# Patient Record
Sex: Female | Born: 1955
Health system: Southern US, Community
[De-identification: ages and names within clinical notes are randomized; demographics above are authoritative.]

## PROBLEM LIST (undated history)

## (undated) DIAGNOSIS — G90522 Complex regional pain syndrome I of left lower limb: Secondary | ICD-10-CM

## (undated) DIAGNOSIS — E079 Disorder of thyroid, unspecified: Secondary | ICD-10-CM

## (undated) DIAGNOSIS — H04129 Dry eye syndrome of unspecified lacrimal gland: Secondary | ICD-10-CM

## (undated) DIAGNOSIS — E785 Hyperlipidemia, unspecified: Secondary | ICD-10-CM

## (undated) DIAGNOSIS — Z974 Presence of external hearing-aid: Secondary | ICD-10-CM

## (undated) DIAGNOSIS — M1990 Unspecified osteoarthritis, unspecified site: Secondary | ICD-10-CM

## (undated) DIAGNOSIS — Z972 Presence of dental prosthetic device (complete) (partial): Secondary | ICD-10-CM

## (undated) DIAGNOSIS — G709 Myoneural disorder, unspecified: Secondary | ICD-10-CM

## (undated) DIAGNOSIS — Z860100 Personal history of colon polyps, unspecified: Secondary | ICD-10-CM

## (undated) DIAGNOSIS — C801 Malignant (primary) neoplasm, unspecified: Secondary | ICD-10-CM

## (undated) DIAGNOSIS — E039 Hypothyroidism, unspecified: Secondary | ICD-10-CM

## (undated) DIAGNOSIS — F419 Anxiety disorder, unspecified: Secondary | ICD-10-CM

## (undated) DIAGNOSIS — M533 Sacrococcygeal disorders, not elsewhere classified: Secondary | ICD-10-CM

## (undated) DIAGNOSIS — Z973 Presence of spectacles and contact lenses: Secondary | ICD-10-CM

## (undated) DIAGNOSIS — Z86000 Personal history of in-situ neoplasm of breast: Secondary | ICD-10-CM

## (undated) HISTORY — DX: Myoneural disorder, unspecified: G70.9

## (undated) HISTORY — PX: JOINT REPLACEMENT: SHX530

## (undated) HISTORY — DX: Personal history of in-situ neoplasm of breast: Z86.000

## (undated) HISTORY — DX: Personal history of colonic polyps: Z86.010

## (undated) HISTORY — PX: BREAST BIOPSY: SHX20

## (undated) HISTORY — DX: Personal history of colon polyps, unspecified: Z86.0100

## (undated) HISTORY — DX: Unspecified osteoarthritis, unspecified site: M19.90

## (undated) HISTORY — PX: COLONOSCOPY: SHX174

## (undated) HISTORY — PX: CARPAL TUNNEL RELEASE: SHX101

## (undated) HISTORY — PX: TUBAL LIGATION: SHX77

## (undated) HISTORY — DX: Anxiety disorder, unspecified: F41.9

## (undated) HISTORY — DX: Disorder of thyroid, unspecified: E07.9

## (undated) HISTORY — DX: Hyperlipidemia, unspecified: E78.5

---

## 1984-03-23 HISTORY — PX: TUBAL LIGATION: SHX77

## 2003-03-24 HISTORY — PX: BREAST BIOPSY: SHX20

## 2003-05-22 HISTORY — PX: BREAST LUMPECTOMY: SHX2

## 2008-01-19 DIAGNOSIS — C50919 Malignant neoplasm of unspecified site of unspecified female breast: Secondary | ICD-10-CM | POA: Insufficient documentation

## 2009-03-26 DIAGNOSIS — K635 Polyp of colon: Secondary | ICD-10-CM | POA: Insufficient documentation

## 2010-01-21 DIAGNOSIS — F329 Major depressive disorder, single episode, unspecified: Secondary | ICD-10-CM | POA: Insufficient documentation

## 2010-12-09 DIAGNOSIS — M949 Disorder of cartilage, unspecified: Secondary | ICD-10-CM | POA: Insufficient documentation

## 2011-03-24 LAB — HM DEXA SCAN

## 2013-11-01 DIAGNOSIS — G5600 Carpal tunnel syndrome, unspecified upper limb: Secondary | ICD-10-CM | POA: Insufficient documentation

## 2013-11-23 DIAGNOSIS — M542 Cervicalgia: Secondary | ICD-10-CM | POA: Insufficient documentation

## 2013-11-23 DIAGNOSIS — G5603 Carpal tunnel syndrome, bilateral upper limbs: Secondary | ICD-10-CM | POA: Insufficient documentation

## 2013-11-23 DIAGNOSIS — F17200 Nicotine dependence, unspecified, uncomplicated: Secondary | ICD-10-CM | POA: Insufficient documentation

## 2013-11-23 DIAGNOSIS — R9089 Other abnormal findings on diagnostic imaging of central nervous system: Secondary | ICD-10-CM | POA: Insufficient documentation

## 2014-03-05 DIAGNOSIS — M8580 Other specified disorders of bone density and structure, unspecified site: Secondary | ICD-10-CM | POA: Insufficient documentation

## 2014-03-21 DIAGNOSIS — Z01419 Encounter for gynecological examination (general) (routine) without abnormal findings: Secondary | ICD-10-CM | POA: Insufficient documentation

## 2014-03-21 DIAGNOSIS — B977 Papillomavirus as the cause of diseases classified elsewhere: Secondary | ICD-10-CM | POA: Insufficient documentation

## 2016-03-23 LAB — HM MAMMOGRAPHY: HM Mammogram: NORMAL (ref 0–4)

## 2016-04-09 DIAGNOSIS — E559 Vitamin D deficiency, unspecified: Secondary | ICD-10-CM | POA: Insufficient documentation

## 2016-04-15 LAB — HM COLONOSCOPY

## 2016-06-21 LAB — HM PAP SMEAR: HM Pap smear: ABNORMAL

## 2016-10-19 ENCOUNTER — Telehealth: Payer: Self-pay | Admitting: Behavioral Health

## 2016-10-19 ENCOUNTER — Encounter: Payer: Self-pay | Admitting: Behavioral Health

## 2016-10-19 NOTE — Telephone Encounter (Signed)
Pre-Visit Call completed with patient and chart updated.   Pre-Visit Info documented in Specialty Comments under SnapShot.    

## 2016-10-21 ENCOUNTER — Ambulatory Visit (INDEPENDENT_AMBULATORY_CARE_PROVIDER_SITE_OTHER): Payer: 59 | Admitting: Family Medicine

## 2016-10-21 ENCOUNTER — Encounter: Payer: Self-pay | Admitting: Family Medicine

## 2016-10-21 VITALS — BP 112/68 | HR 83 | Temp 98.3°F | Ht 66.0 in | Wt 186.4 lb

## 2016-10-21 DIAGNOSIS — E039 Hypothyroidism, unspecified: Secondary | ICD-10-CM

## 2016-10-21 DIAGNOSIS — F419 Anxiety disorder, unspecified: Secondary | ICD-10-CM

## 2016-10-21 DIAGNOSIS — M7711 Lateral epicondylitis, right elbow: Secondary | ICD-10-CM | POA: Diagnosis not present

## 2016-10-21 DIAGNOSIS — Z23 Encounter for immunization: Secondary | ICD-10-CM

## 2016-10-21 DIAGNOSIS — E785 Hyperlipidemia, unspecified: Secondary | ICD-10-CM

## 2016-10-21 MED ORDER — MELOXICAM 15 MG PO TABS: 15.0000 mg | ORAL_TABLET | Freq: Every day | ORAL | 0 refills | Status: DC

## 2016-10-21 MED ORDER — LEVOTHYROXINE SODIUM 125 MCG PO TABS: 125.0000 ug | ORAL_TABLET | Freq: Every day | ORAL | 1 refills | Status: DC

## 2016-10-21 MED ORDER — VENLAFAXINE HCL ER 150 MG PO CP24: 150.0000 mg | ORAL_CAPSULE | Freq: Every day | ORAL | 1 refills | Status: DC

## 2016-10-21 MED ORDER — CYCLOBENZAPRINE HCL 10 MG PO TABS: 10.0000 mg | ORAL_TABLET | ORAL | 0 refills | Status: DC | PRN

## 2016-10-21 MED ORDER — ALPRAZOLAM 0.5 MG PO TABS: 0.5000 mg | ORAL_TABLET | Freq: Every day | ORAL | 1 refills | Status: DC | PRN

## 2016-10-21 MED ORDER — ATORVASTATIN CALCIUM 10 MG PO TABS: 10.0000 mg | ORAL_TABLET | Freq: Every day | ORAL | 1 refills | Status: DC

## 2016-10-21 NOTE — Patient Instructions (Addendum)
Ice/cold pack over area for 10-15 min every 2-3 hours while awake.  Use a forearm strap (Band-IT or another option available) to help with pain.  Let us know if you need anything.

## 2016-10-21 NOTE — Progress Notes (Signed)
Musculoskeletal Exam  Patient: Elaine Buck DOB: 1955/09/01  DOS: 10/21/2016  SUBJECTIVE:  Chief Complaint:   Chief Complaint  Patient presents with  . Establish Care    Patient is C/O right elbow pain with movement times 6 months.  Describes as dull ache.  Denies any injury.    Elaine Buck is a 61 y.o.  female for evaluation and treatment of R elbow pain. She is a new pt.   Onset:  6 months ago. Sudden, no change in activity.  Location: Outer R elbow Character:  dull  Progression of issue:  is unchanged Associated symptoms: None Treatment: to date has been: Tylenol arthritis.   Neurovascular symptoms: no  ROS: Musculoskeletal/Extremities: +R elbow pain Neurologic: no numbness, tingling no weakness   Past Medical History:  Diagnosis Date  . Anxiety   . Hyperlipidemia   . Thyroid disease    Past Surgical History:  Procedure Laterality Date  . BREAST BIOPSY Left 05/22/2003   LCIS Cells found in breast tissue  . NO PAST SURGERIES     Family History  Problem Relation Age of Onset  . Stroke Mother   . Bipolar disorder Son   . ADD / ADHD Son   . Anxiety disorder Son    Current Outpatient Prescriptions  Medication Sig Dispense Refill  . ALPRAZolam (XANAX) 0.5 MG tablet Take 0.5 mg by mouth as needed for anxiety.    Marland Kitchen atorvastatin (LIPITOR) 10 MG tablet Take 10 mg by mouth daily.    . cyclobenzaprine (FLEXERIL) 10 MG tablet Take 1 tablet by mouth as needed.    Marland Kitchen levothyroxine (SYNTHROID, LEVOTHROID) 125 MCG tablet Take 125 mcg by mouth daily before breakfast.    . venlafaxine XR (EFFEXOR-XR) 150 MG 24 hr capsule Take 150 mg by mouth daily with breakfast.     No Known Allergies Social History   Social History  . Marital status: Married   Social History Main Topics  . Smoking status: Smoker, Current Status Unknown    Packs/day: 0.50    Years: 20.00    Types: Cigarettes  . Smokeless tobacco: Never Used  . Alcohol use Yes     Comment: [place].  . Drug use:  No   Objective: VITAL SIGNS: BP 112/68 (BP Location: Right Arm, Patient Position: Sitting, Cuff Size: Normal)   Pulse 83   Temp 98.3 F (36.8 C) (Oral)   Ht 5\' 6"  (1.676 m)   Wt 186 lb 6.4 oz (84.6 kg)   SpO2 97%   BMI 30.09 kg/m  Constitutional: Well formed, well developed. No acute distress. Cardiovascular: RRR, no murmurs, no bruits, brisk cap refill Thorax & Lungs: CTAB; No accessory muscle use Extremities: No clubbing. No cyanosis. No edema.  Skin: Warm. Dry. No erythema. No rash.  Musculoskeletal: R elbow.   Normal active range of motion: yes.   Normal passive range of motion: yes Tenderness to palpation: Yes, over Lat epicondyle and prox wrist extensors Deformity: no Ecchymosis: no +Pain with resisted extension of R wrist Neurologic: Normal sensory function.  Psychiatric: Normal mood. Age appropriate judgment and insight. Alert & oriented x 3.    Assessment:  Lateral epicondylitis of right elbow - Plan: meloxicam (MOBIC) 15 MG tablet  Hypothyroidism, unspecified type - Plan: levothyroxine (SYNTHROID, LEVOTHROID) 125 MCG tablet  Anxiety - Plan: ALPRAZolam (XANAX) 0.5 MG tablet, venlafaxine XR (EFFEXOR-XR) 150 MG 24 hr capsule, cyclobenzaprine (FLEXERIL) 10 MG tablet  Hyperlipidemia, unspecified hyperlipidemia type - Plan: atorvastatin (LIPITOR) 10 MG tablet  Plan:  Tennis elbow, recommended elbow/forearm strap, NSAIDs, ice prn. If no improvement, RTC and will discuss OMT vs  PT. Orders as above. She uses Flexeril 2-3 times per mo, Xanax 6-8 times per month. Doing well on other meds. CSC today. UDS at next visit when she is getting labs. Will discuss BMD testing at f/u visit. F/u in around 10 weeks in middle of October (this will put her 6 mo out from previous labs). The patient voiced understanding and agreement to the plan.   [place], DO 10/21/16  11:08 AM

## 2016-10-25 ENCOUNTER — Other Ambulatory Visit: Payer: Self-pay | Admitting: Family Medicine

## 2016-10-25 DIAGNOSIS — M7711 Lateral epicondylitis, right elbow: Secondary | ICD-10-CM

## 2016-10-25 DIAGNOSIS — F419 Anxiety disorder, unspecified: Secondary | ICD-10-CM

## 2016-10-26 NOTE — Telephone Encounter (Signed)
Rx's denied.  Pt and pharmary requesting refill to soon.//AB/CMA

## 2016-12-28 ENCOUNTER — Encounter: Payer: Self-pay | Admitting: Family Medicine

## 2016-12-28 ENCOUNTER — Other Ambulatory Visit: Payer: Self-pay | Admitting: Family Medicine

## 2016-12-28 ENCOUNTER — Ambulatory Visit (INDEPENDENT_AMBULATORY_CARE_PROVIDER_SITE_OTHER): Payer: 59 | Admitting: Family Medicine

## 2016-12-28 ENCOUNTER — Telehealth: Payer: Self-pay | Admitting: Family Medicine

## 2016-12-28 VITALS — BP 104/64 | HR 80 | Temp 98.5°F | Ht 66.0 in | Wt 187.0 lb

## 2016-12-28 DIAGNOSIS — M8580 Other specified disorders of bone density and structure, unspecified site: Secondary | ICD-10-CM | POA: Diagnosis not present

## 2016-12-28 DIAGNOSIS — F419 Anxiety disorder, unspecified: Secondary | ICD-10-CM | POA: Diagnosis not present

## 2016-12-28 DIAGNOSIS — E039 Hypothyroidism, unspecified: Secondary | ICD-10-CM | POA: Diagnosis not present

## 2016-12-28 DIAGNOSIS — E785 Hyperlipidemia, unspecified: Secondary | ICD-10-CM | POA: Diagnosis not present

## 2016-12-28 DIAGNOSIS — Z23 Encounter for immunization: Secondary | ICD-10-CM

## 2016-12-28 LAB — COMPREHENSIVE METABOLIC PANEL
ALT: 23 U/L (ref 0–35)
AST: 18 U/L (ref 0–37)
Albumin: 4.4 g/dL (ref 3.5–5.2)
Alkaline Phosphatase: 62 U/L (ref 39–117)
BUN: 13 mg/dL (ref 6–23)
CO2: 26 mEq/L (ref 19–32)
Calcium: 9.1 mg/dL (ref 8.4–10.5)
Chloride: 106 mEq/L (ref 96–112)
Creatinine, Ser: 0.85 mg/dL (ref 0.40–1.20)
GFR: 71.98 mL/min (ref >60.00)
Glucose, Bld: 103 mg/dL — ABNORMAL HIGH (ref 70–99)
Potassium: 3.9 mEq/L (ref 3.5–5.1)
Sodium: 141 mEq/L (ref 135–145)
Total Bilirubin: 0.4 mg/dL (ref 0.2–1.2)
Total Protein: 7 g/dL (ref 6.0–8.3)

## 2016-12-28 LAB — LIPID PANEL
Cholesterol: 167 mg/dL (ref 0–200)
HDL: 45.4 mg/dL
LDL Cholesterol: 98 mg/dL (ref 0–99)
NonHDL: 121.39
Total CHOL/HDL Ratio: 4
Triglycerides: 119 mg/dL (ref 0.0–149.0)
VLDL: 23.8 mg/dL (ref 0.0–40.0)

## 2016-12-28 LAB — T4, FREE: Free T4: 1.11 ng/dL (ref 0.60–1.60)

## 2016-12-28 LAB — TSH: TSH: 0.15 u[IU]/mL — ABNORMAL LOW (ref 0.35–4.50)

## 2016-12-28 MED ORDER — LEVOTHYROXINE SODIUM 125 MCG PO TABS: 125.0000 ug | ORAL_TABLET | Freq: Every day | ORAL | 0 refills | Status: DC

## 2016-12-28 MED ORDER — LEVOTHYROXINE SODIUM 125 MCG PO TABS: 125.0000 ug | ORAL_TABLET | Freq: Every day | ORAL | 1 refills | Status: DC

## 2016-12-28 MED ORDER — ALPRAZOLAM 0.5 MG PO TABS: 0.5000 mg | ORAL_TABLET | Freq: Every day | ORAL | 5 refills | Status: DC | PRN

## 2016-12-28 NOTE — Progress Notes (Signed)
Chief Complaint  Patient presents with  . Follow-up    tennis elbow    Subjective: Dyslipidemia Patient presents for dyslipidemia follow up. Compliance with treatment thus far has been good. She denies myalgias. She does not use medications that may worsen dyslipidemias (corticosteroids, progestins, anabolic steroids, diuretics, beta-blockers, amiodarone, cyclosporine, olanzapine). She is adhering to a low sodium and low fat diet. The patient exercises 2-3 times per week; walking and physically active around at home.  The patient is known to have coexisting coronary artery disease.   Hypothyroidism Patient presents for follow-up of hypothyroidism.  Reports compliance with medication- Synthroid 125 mcg daily. Current symptoms include: none Denies: fatigue, weight gain, feeling cold and cold intolerance, constipation, losing hair, palpitations and weight loss She believes her dose should be unchanged  Anxiety/depression Currently tx'd with Effexor. Tolerating well, compliant. Xanax around 6-8x/mo. Stressors include son living w them who has Bipolar. No SI or HI. No self medication.   ROS: Heart: Denies chest pain Lungs: Denies SOB   Family History  Problem Relation Age of Onset  . Stroke Mother   . Bipolar disorder Son   . ADD / ADHD Son   . Anxiety disorder Son    Past Medical History:  Diagnosis Date  . Anxiety   . Hyperlipidemia   . Thyroid disease    No Known Allergies  Current Outpatient Prescriptions:  .  ALPRAZolam (XANAX) 0.5 MG tablet, Take 1 tablet (0.5 mg total) by mouth daily as needed for anxiety., Disp: 30 tablet, Rfl: 5 .  atorvastatin (LIPITOR) 10 MG tablet, Take 1 tablet (10 mg total) by mouth daily., Disp: 90 tablet, Rfl: 1 .  cyclobenzaprine (FLEXERIL) 10 MG tablet, Take 1 tablet (10 mg total) by mouth as needed., Disp: 30 tablet, Rfl: 0 .  venlafaxine XR (EFFEXOR-XR) 150 MG 24 hr capsule, Take 1 capsule (150 mg total) by mouth daily with  breakfast., Disp: 90 capsule, Rfl: 1 .  levothyroxine (SYNTHROID, LEVOTHROID) 125 MCG tablet, Take 1 tablet (125 mcg total) by mouth daily., Disp: 15 tablet, Rfl: 0  Objective: BP 104/64 (BP Location: Left Arm, Patient Position: Sitting, Cuff Size: Normal)   Pulse 80   Temp 98.5 F (36.9 C) (Oral)   Ht 5\' 6"  (1.676 m)   Wt 187 lb (84.8 kg)   SpO2 96%   BMI 30.18 kg/m  General: Awake, appears stated age HEENT: MMM, EOMi Neck: Supple, symmetric, no thyromegaly Heart: RRR, no murmurs, no bruits, no LE edema Lungs: CTAB, no rales, wheezes or rhonchi. No accessory muscle use Psych: Age appropriate judgment and insight, normal affect and mood  Assessment and Plan: Anxiety - Plan: ALPRAZolam (XANAX) 0.5 MG tablet  Hyperlipidemia, unspecified hyperlipidemia type - Plan: Lipid panel, Comprehensive metabolic panel  Hypothyroidism, unspecified type - Plan: TSH, T4, free, DISCONTINUED: levothyroxine (SYNTHROID, LEVOTHROID) 125 MCG tablet  Osteopenia, unspecified location - Plan: DG Bone Density  Orders as above. Cont current dosages. Recheck some labs.  Immunization updates.  F/u in 6 mo or prn. The patient voiced understanding and agreement to the plan.  [place], DO 12/28/16  8:33 AM

## 2016-12-28 NOTE — Patient Instructions (Addendum)
Give [place] 2-3 business days to get the results of your labs back. If labs are normal, you will likely receive a letter in the mail unless you have MyChart. This can take longer than 2-3 business days.   Let us know if you need anything.

## 2016-12-28 NOTE — Telephone Encounter (Signed)
Pt returned call for results. Pt ph [phone].

## 2016-12-28 NOTE — Progress Notes (Signed)
Pre visit review using our clinic review tool, if applicable. No additional management support is needed unless otherwise documented below in the visit note. 

## 2016-12-28 NOTE — Telephone Encounter (Signed)
See results

## 2016-12-28 NOTE — Addendum Note (Signed)
Addended by: [place] Seller B on: 12/28/2016 08:53 AM   Modules accepted: Orders

## 2016-12-30 ENCOUNTER — Ambulatory Visit: Payer: 59 | Admitting: Family Medicine

## 2016-12-30 ENCOUNTER — Ambulatory Visit: Payer: Self-pay | Admitting: Family Medicine

## 2017-01-11 ENCOUNTER — Ambulatory Visit (HOSPITAL_BASED_OUTPATIENT_CLINIC_OR_DEPARTMENT_OTHER)
Admission: RE | Admit: 2017-01-11 | Discharge: 2017-01-11 | Disposition: A | Discharge status: Home / Self Care | Payer: 59 | Source: Ambulatory Visit | Attending: Family Medicine | Admitting: Family Medicine

## 2017-01-11 DIAGNOSIS — M8580 Other specified disorders of bone density and structure, unspecified site: Secondary | ICD-10-CM | POA: Diagnosis present

## 2017-01-11 DIAGNOSIS — M85852 Other specified disorders of bone density and structure, left thigh: Secondary | ICD-10-CM | POA: Diagnosis not present

## 2017-05-10 ENCOUNTER — Ambulatory Visit (INDEPENDENT_AMBULATORY_CARE_PROVIDER_SITE_OTHER): Payer: 59 | Admitting: Family Medicine

## 2017-05-10 ENCOUNTER — Encounter: Payer: Self-pay | Admitting: Family Medicine

## 2017-05-10 VITALS — BP 110/62 | HR 97 | Temp 98.1°F | Ht 66.0 in | Wt 191.1 lb

## 2017-05-10 DIAGNOSIS — J0100 Acute maxillary sinusitis, unspecified: Secondary | ICD-10-CM

## 2017-05-10 MED ORDER — AMOXICILLIN-POT CLAVULANATE 875-125 MG PO TABS
1.0000 | ORAL_TABLET | Freq: Two times a day (BID) | ORAL | 0 refills | Status: DC
Start: 2017-05-10 — End: 2017-07-02

## 2017-05-10 NOTE — Progress Notes (Signed)
Pre visit review using our clinic review tool, if applicable. No additional management support is needed unless otherwise documented below in the visit note. 

## 2017-05-10 NOTE — Patient Instructions (Addendum)
Most sinus infections are viral in etiology and antibiotics will not be helpful. That being said, if you start having worsening symptoms over 3 days, you are worsening by day 10 or not improving by day 14, go ahead and take it. You are on Day 5 as of now.   Ibuprofen 400-600 mg (2-3 over the counter strength tabs) every 6 hours as needed for pain.  Continue to push fluids, practice good hand hygiene, and cover your mouth if you cough.  If you start having fevers, shaking or shortness of breath, seek immediate care.  Let us know if you need anything.

## 2017-05-10 NOTE — Progress Notes (Signed)
CC: Sinus infxn  Elaine Buck here for URI complaints.  Duration: 5 days  Associated symptoms: sinus congestion, sinus pain, ear fullness and cough Denies: rhinorrhea, itchy watery eyes, ear pain, ear drainage, sore throat, wheezing, shortness of breath, myalgia and fevers Treatment to date: Nyquil, Dayquil, Sudafed, Mucinex Sick contacts: No  ROS:  Const: Denies fevers HEENT: As noted in HPI Lungs: No SOB  Past Medical History:  Diagnosis Date  . Anxiety   . Hyperlipidemia   . Thyroid disease    Family History  Problem Relation Age of Onset  . Stroke Mother   . Bipolar disorder Son   . ADD / ADHD Son   . Anxiety disorder Son     BP 110/62 (BP Location: Left Arm, Patient Position: Sitting, Cuff Size: Normal)   Pulse 97   Temp 98.1 F (36.7 C) (Oral)   Ht 5\' 6"  (1.676 m)   Wt 191 lb 2 oz (86.7 kg)   SpO2 99%   BMI 30.85 kg/m  General: Awake, alert, appears stated age HEENT: AT, [place], ears patent b/l and TM's neg, +max sinus ttp b/l, nares patent w/o discharge, pharynx pink and without exudates, MMM Neck: No masses or asymmetry Heart: RRR Lungs: CTAB, no accessory muscle use Psych: Age appropriate judgment and insight, normal mood and affect  Acute maxillary sinusitis, recurrence not specified - Plan: amoxicillin-clavulanate (AUGMENTIN) 875-125 MG tablet  Orders as above. Pocket rx instructions given. Ibuprofen. Continue to push fluids, practice good hand hygiene, cover mouth when coughing. F/u prn. If starting to experience fevers, shaking, or shortness of breath, seek immediate care. Pt voiced understanding and agreement to the plan.  [place], DO 05/10/17 3:40 PM

## 2017-06-03 ENCOUNTER — Encounter: Payer: Self-pay | Admitting: Family Medicine

## 2017-06-03 ENCOUNTER — Ambulatory Visit (INDEPENDENT_AMBULATORY_CARE_PROVIDER_SITE_OTHER): Payer: 59 | Admitting: Family Medicine

## 2017-06-03 VITALS — BP 110/70 | HR 84 | Temp 98.2°F | Ht 66.0 in | Wt 192.0 lb

## 2017-06-03 DIAGNOSIS — L989 Disorder of the skin and subcutaneous tissue, unspecified: Secondary | ICD-10-CM

## 2017-06-03 NOTE — Progress Notes (Signed)
Chief Complaint  Patient presents with  . black spot on left breast    Elaine Buck is a 62 y.o. female here for a skin complaint.  Duration: 2 days Location: L breast Pruritic? No Painful? No Drainage? No Other associated symptoms: redness around area, concerned it's a tick Therapies tried thus far: none  ROS:  Const: No fevers Skin: As noted in HPI  Past Medical History:  Diagnosis Date  . Anxiety   . Hyperlipidemia   . Thyroid disease    BP 110/70 (BP Location: Left Arm, Patient Position: Sitting, Cuff Size: Normal)   Pulse 84   Temp 98.2 F (36.8 C) (Oral)   Ht 5\' 6"  (1.676 m)   Wt 192 lb (87.1 kg)   SpO2 95%   BMI 30.99 kg/m  Gen: awake, alert, appearing stated age Lungs: No accessory muscle use Skin: see below. Examined in presence of female chaperone. No drainage, TTP, fluctuance Psych: Age appropriate judgment and insight  Media Information   L breast just above areola  Procedure note; shave biopsy Informed consent was obtained. The area was cleaned with alcohol and injected with 0.75 mL of 1% lidocaine with epinephrine. The area was grasped with a forceps and an 11 blade was used to dissect around area.  The specimen was placed in a sterile specimen cup and sent to the lab. The area was then cauterized ensuring adequate hemostasis. The area was dressed with triple antibiotic ointment and a bandage. There were no complications noted. The patient tolerated the procedure well.   Skin lesion - Plan: Dermatology pathology, PR SHAV SKIN LES < 0.5 CM TRUNK,ARM,LEG  Orders as above. Pt very concerned about possible malignancy or FB. Looks like a scab, but took off and sent to lab. Hopefully I am right. After care instructions verbalized and written in AVS. F/u prn. The patient voiced understanding and agreement to the plan.  [place], DO 06/03/17 4:34 PM

## 2017-06-03 NOTE — Patient Instructions (Signed)
Do not shower for the rest of the day. When you do wash it, use only soap and water. Do not vigorously scrub. Apply triple antibiotic ointment (like Neosporin) twice daily. Keep the area clean and dry.   Things to look out for: increasing pain not relieved by ibuprofen/acetaminophen, fevers, spreading redness, drainage of pus, or foul odor.  Around 1 week to get the skin results back.   Let us know if you need anything.

## 2017-06-03 NOTE — Progress Notes (Signed)
Pre visit review using our clinic review tool, if applicable. No additional management support is needed unless otherwise documented below in the visit note. 

## 2017-06-28 ENCOUNTER — Ambulatory Visit: Payer: 59 | Admitting: Family Medicine

## 2017-06-30 ENCOUNTER — Ambulatory Visit: Payer: 59 | Admitting: Family Medicine

## 2017-07-02 ENCOUNTER — Ambulatory Visit (INDEPENDENT_AMBULATORY_CARE_PROVIDER_SITE_OTHER): Payer: 59 | Admitting: Family Medicine

## 2017-07-02 ENCOUNTER — Encounter: Payer: Self-pay | Admitting: Family Medicine

## 2017-07-02 VITALS — BP 110/68 | HR 74 | Temp 98.6°F | Ht 66.0 in | Wt 189.5 lb

## 2017-07-02 DIAGNOSIS — F419 Anxiety disorder, unspecified: Secondary | ICD-10-CM | POA: Diagnosis not present

## 2017-07-02 DIAGNOSIS — E785 Hyperlipidemia, unspecified: Secondary | ICD-10-CM | POA: Diagnosis not present

## 2017-07-02 DIAGNOSIS — E039 Hypothyroidism, unspecified: Secondary | ICD-10-CM

## 2017-07-02 LAB — COMPREHENSIVE METABOLIC PANEL
ALT: 24 U/L (ref 0–35)
AST: 22 U/L (ref 0–37)
Albumin: 4.4 g/dL (ref 3.5–5.2)
Alkaline Phosphatase: 63 U/L (ref 39–117)
BUN: 17 mg/dL (ref 6–23)
CO2: 29 mEq/L (ref 19–32)
Calcium: 9.3 mg/dL (ref 8.4–10.5)
Chloride: 104 mEq/L (ref 96–112)
Creatinine, Ser: 0.89 mg/dL (ref 0.40–1.20)
GFR: 68.15 mL/min (ref >60.00)
Glucose, Bld: 95 mg/dL (ref 70–99)
Potassium: 4.2 mEq/L (ref 3.5–5.1)
Sodium: 141 mEq/L (ref 135–145)
Total Bilirubin: 0.6 mg/dL (ref 0.2–1.2)
Total Protein: 7.2 g/dL (ref 6.0–8.3)

## 2017-07-02 LAB — LIPID PANEL
Cholesterol: 165 mg/dL (ref 0–200)
HDL: 43.7 mg/dL (ref >39.00)
LDL Cholesterol: 99 mg/dL (ref 0–99)
NonHDL: 121.54
Total CHOL/HDL Ratio: 4
Triglycerides: 115 mg/dL (ref 0.0–149.0)
VLDL: 23 mg/dL (ref 0.0–40.0)

## 2017-07-02 LAB — TSH: TSH: 0.17 u[IU]/mL — ABNORMAL LOW (ref 0.35–4.50)

## 2017-07-02 MED ORDER — VENLAFAXINE HCL ER 150 MG PO CP24: 150.0000 mg | ORAL_CAPSULE | Freq: Every day | ORAL | 1 refills | Status: DC

## 2017-07-02 MED ORDER — ALPRAZOLAM 0.5 MG PO TABS: 0.5000 mg | ORAL_TABLET | Freq: Every day | ORAL | 5 refills | Status: DC | PRN

## 2017-07-02 NOTE — Patient Instructions (Addendum)
Please consider counseling. Contact [phone] to schedule an appointment or inquire about cost/insurance coverage.  1-2 days to get results of labs back and then refills associated with that.   Let us know if you need anything.

## 2017-07-02 NOTE — Progress Notes (Signed)
Chief Complaint  Patient presents with  . Follow-up    Subjective Elaine Buck presents for f/u anxiety/depression.  She is currently being treated with Effexor 150 mg XR, uses Xanax around 12 d/month.  Reports doing well since treatment. No thoughts of harming self or others. No self-medication with alcohol, prescription drugs or illicit drugs. Pt is not following with a counselor/psychologist.   Hyperlipidemia Patient presents for dyslipidemia follow up. Currently being treated with Lipitor 10 mg/d and compliance with treatment thus far has been good. She denies myalgias. She is sometimes adhering to a healthy. The patient exercises walks, lifting weights.  The patient is not known to have coexisting coronary artery disease.  Hypothyroidism Patient presents for follow-up of hypothyroidism.  Reports compliance with medication-thyroxine 125 mcg daily. Denies: denies fatigue, weight changes, heat/cold intolerance, bowel/skin changes or CVS symptoms She believes her dose should be unchanged pending labs  ROS Psych: No homicidal or suicidal thoughts MSK: +Jt pain, no muscle aches  Past Medical History:  Diagnosis Date  . Anxiety   . Hyperlipidemia   . Thyroid disease    Family History  Problem Relation Age of Onset  . Stroke Mother   . Bipolar disorder Son   . ADD / ADHD Son   . Anxiety disorder Son     Allergies as of 07/02/2017   No Known Allergies     Medication List        Accurate as of 07/02/17 10:13 AM. Always use your most recent med list.          ALPRAZolam 0.5 MG tablet Commonly known as:  XANAX Take 1 tablet (0.5 mg total) by mouth daily as needed for anxiety.   atorvastatin 10 MG tablet Commonly known as:  LIPITOR Take 1 tablet (10 mg total) by mouth daily.   cyclobenzaprine 10 MG tablet Commonly known as:  FLEXERIL Take 1 tablet (10 mg total) by mouth as needed.   levothyroxine 125 MCG tablet Commonly known as:  SYNTHROID,  LEVOTHROID Take 1 tablet (125 mcg total) by mouth daily.   venlafaxine XR 150 MG 24 hr capsule Commonly known as:  EFFEXOR-XR Take 1 capsule (150 mg total) by mouth daily with breakfast.       Exam BP 110/68 (BP Location: Left Arm, Patient Position: Sitting, Cuff Size: Normal)   Pulse 74   Temp 98.6 F (37 C) (Oral)   Ht 5\' 6"  (1.676 m)   Wt 189 lb 8 oz (86 kg)   SpO2 96%   BMI 30.59 kg/m  General:  well developed, well nourished, in no apparent distress Neck: neck supple without adenopathy, thyromegaly, or masses Lungs:  clear to auscultation, breath sounds equal bilaterally, no respiratory distress Cardio:  regular rate and rhythm without murmurs, heart sounds without clicks or rubs Psych: well oriented with normal range of affect and age-appropriate judgement/insight, alert and oriented x4.  Assessment and Plan  Anxiety - Plan: venlafaxine XR (EFFEXOR-XR) 150 MG 24 hr capsule, ALPRAZolam (XANAX) 0.5 MG tablet  Hypothyroidism, unspecified type - Plan: TSH  Hyperlipidemia, unspecified hyperlipidemia type - Plan: Lipid panel, Comprehensive metabolic panel  Orders as above. Doing well on above. [facility] info given. Will refill thyroid and chol med pending results.  F/u in in 6 mo for CPE. The patient voiced understanding and agreement to the plan.  [place], DO 07/02/17 10:13 AM

## 2017-07-02 NOTE — Progress Notes (Signed)
Pre visit review using our clinic review tool, if applicable. No additional management support is needed unless otherwise documented below in the visit note. 

## 2017-07-03 ENCOUNTER — Encounter: Payer: Self-pay | Admitting: Family Medicine

## 2017-07-05 ENCOUNTER — Other Ambulatory Visit: Payer: Self-pay | Admitting: Family Medicine

## 2017-07-05 DIAGNOSIS — E785 Hyperlipidemia, unspecified: Secondary | ICD-10-CM

## 2017-07-05 DIAGNOSIS — E038 Other specified hypothyroidism: Secondary | ICD-10-CM

## 2017-07-05 MED ORDER — ATORVASTATIN CALCIUM 10 MG PO TABS: 10.0000 mg | ORAL_TABLET | Freq: Every day | ORAL | 1 refills | Status: DC

## 2017-07-06 ENCOUNTER — Other Ambulatory Visit (INDEPENDENT_AMBULATORY_CARE_PROVIDER_SITE_OTHER): Payer: 59

## 2017-07-06 ENCOUNTER — Encounter: Payer: Self-pay | Admitting: Family Medicine

## 2017-07-06 DIAGNOSIS — E038 Other specified hypothyroidism: Secondary | ICD-10-CM

## 2017-07-06 LAB — T4, FREE: Free T4: 0.92 ng/dL (ref 0.60–1.60)

## 2017-07-07 MED ORDER — LEVOTHYROXINE SODIUM 125 MCG PO TABS: 125.0000 ug | ORAL_TABLET | Freq: Every day | ORAL | 1 refills | Status: DC

## 2017-07-20 ENCOUNTER — Ambulatory Visit (INDEPENDENT_AMBULATORY_CARE_PROVIDER_SITE_OTHER): Payer: 59 | Admitting: Obstetrics & Gynecology

## 2017-07-20 ENCOUNTER — Encounter: Payer: Self-pay | Admitting: Obstetrics & Gynecology

## 2017-07-20 VITALS — BP 106/72 | HR 72 | Wt 189.2 lb

## 2017-07-20 DIAGNOSIS — Z1151 Encounter for screening for human papillomavirus (HPV): Secondary | ICD-10-CM

## 2017-07-20 DIAGNOSIS — Z86000 Personal history of in-situ neoplasm of breast: Secondary | ICD-10-CM

## 2017-07-20 DIAGNOSIS — Z124 Encounter for screening for malignant neoplasm of cervix: Secondary | ICD-10-CM | POA: Diagnosis not present

## 2017-07-20 DIAGNOSIS — Z01419 Encounter for gynecological examination (general) (routine) without abnormal findings: Secondary | ICD-10-CM

## 2017-07-20 NOTE — Progress Notes (Signed)
GYNECOLOGY ANNUAL PREVENTATIVE CARE ENCOUNTER NOTE  Subjective:   Elaine Buck is a 62 y.o. [id number] PMP  female here for a routine annual gynecologic exam.  Current complaints: none.    Denies abnormal vaginal bleeding, discharge, pelvic pain, problems with intercourse or other gynecologic concerns.    Gynecologic History No LMP recorded. Patient is postmenopausal. Contraception: post menopausal status Last Pap: 2018. Results were: abnormal with positive HPV. Reports that she had positive HPV paps for about 5 years always with benign colposcopies, no procedures/ other interventions done just yearly surveillance.  Last mammogram: 2018. Results were: normal. History of left breast mass discovered in 2005, removed and they found LCIS cells in tissue. Treated with Tamoxifen until 2007; then switched to Evista for three more years. All subsequent mammograms have been negative.  Obstetric History OB History  Gravida Para Term Preterm AB Living  3 2 2   1 2   SAB TAB Ectopic Multiple Live Births  1       2    # Outcome Date GA Lbr Len/2nd Weight Sex Delivery Anes PTL Lv  3 SAB           2 Term           1 Term             Past Medical History:  Diagnosis Date  . Anxiety   . History of lobular carcinoma in situ (LCIS) of  left breast    s/p lumpectomy in 2005, Tamoxifen/Evista therapy for 5 years  . Hyperlipidemia   . Thyroid disease     Past Surgical History:  Procedure Laterality Date  . BREAST LUMPECTOMY Left 05/22/2003   LCIS Cells found in breast tissue    Current Outpatient Medications on File Prior to Visit  Medication Sig Dispense Refill  . ALPRAZolam (XANAX) 0.5 MG tablet Take 1 tablet (0.5 mg total) by mouth daily as needed for anxiety. 30 tablet 5  . atorvastatin (LIPITOR) 10 MG tablet Take 1 tablet (10 mg total) by mouth daily. 90 tablet 1  . cyclobenzaprine (FLEXERIL) 10 MG tablet Take 1 tablet (10 mg total) by mouth as needed. 30 tablet 0  . levothyroxine  (SYNTHROID, LEVOTHROID) 125 MCG tablet Take 1 tablet (125 mcg total) by mouth daily. 90 tablet 1  . venlafaxine XR (EFFEXOR-XR) 150 MG 24 hr capsule Take 1 capsule (150 mg total) by mouth daily with breakfast. 90 capsule 1   No current facility-administered medications on file prior to visit.     No Known Allergies  Social History   Socioeconomic History  . Marital status: Married    Spouse name: Not on file  . Number of children: 2  . Years of education: Not on file  . Highest education level: Some college, no degree  Occupational History  . Occupation: retired  Scientific laboratory technician  . Financial resource strain: Not on file  . Food insecurity:    Worry: Not on file    Inability: Not on file  . Transportation needs:    Medical: Not on file    Non-medical: Not on file  Tobacco Use  . Smoking status: Current Every Day Smoker    Packs/day: 0.50    Years: 20.00    Pack years: 10.00    Types: Cigarettes  . Smokeless tobacco: Never Used  . Tobacco comment: Declines quitting for now  Substance and Sexual Activity  . Alcohol use: Yes    Comment: Occasionlly  . Drug use:  No  . Sexual activity: Yes    Partners: Male  Lifestyle  . Physical activity:    Days per week: 7 days    Minutes per session: 20 min  . Stress: Rather much  Relationships  . Social connections:    Talks on phone: Not on file    Gets together: Not on file    Attends religious service: Not on file    Active member of club or organization: Not on file    Attends meetings of clubs or organizations: Not on file    Relationship status: Not on file  . Intimate partner violence:    Fear of current or ex partner: Not on file    Emotionally abused: Not on file    Physically abused: Not on file    Forced sexual activity: Not on file  Other Topics Concern  . Not on file  Social History Narrative  . Not on file    Family History  Problem Relation Age of Onset  . Stroke Mother   . Bipolar disorder Son   . ADD /  ADHD Son   . Anxiety disorder Son     The following portions of the patient's history were reviewed and updated as appropriate: allergies, current medications, past family history, past medical history, past social history, past surgical history and problem list.  Review of Systems Pertinent items noted in HPI and remainder of comprehensive ROS otherwise negative.   Objective:  BP 106/72   Pulse 72   Wt 189 lb 3.2 oz (85.8 kg)   BMI 30.54 kg/m  CONSTITUTIONAL: Well-developed, well-nourished female in no acute distress.  HENT:  Normocephalic, atraumatic, External right and left ear normal. Oropharynx is clear and moist EYES: Conjunctivae and EOM are normal. Pupils are equal, round, and reactive to light. No scleral icterus.  NECK: Normal range of motion, supple, no masses.  Normal thyroid.  SKIN: Skin is warm and dry. No rash noted. Not diaphoretic. No erythema. No pallor. NEUROLOGIC: Alert and oriented to person, place, and time. Normal reflexes, muscle tone coordination. No cranial nerve deficit noted. PSYCHIATRIC: Normal mood and affect. Normal behavior. Normal judgment and thought content. CARDIOVASCULAR: Normal heart rate noted, regular rhythm RESPIRATORY: Clear to auscultation bilaterally. Effort and breath sounds normal, no problems with respiration noted. BREASTS: Symmetric in size. No masses, skin changes, nipple drainage, or lymphadenopathy. ABDOMEN: Soft, normal bowel sounds, no distention noted.  No tenderness, rebound or guarding.  PELVIC: Normal appearing external genitalia; normal appearing vaginal mucosa and cervix with moderate atrophy.  No abnormal discharge noted.  Pap smear obtained; cervical stenosis noted and unable to get endocervical cells.  Normal uterine size, no other palpable masses, no uterine or adnexal tenderness. MUSCULOSKELETAL: Normal range of motion. No tenderness.  No cyanosis, clubbing, or edema.  2+ distal pulses.   Assessment and Plan:  1. History  of lobular carcinoma in situ (LCIS) of  left breast Screening Mammogram scheduled - MM 3D SCREEN BREAST BILATERAL; Future  2. Encounter for gynecological examination with Papanicolaou smear of cervix - Cytology - PAP Will follow up results of pap smear and manage accordingly. If she has persistent HRHPV, may consider cryotherapy to reduce risk of cervical dysplasia.  Routine preventative health maintenance measures emphasized. Please refer to After Visit Summary for other counseling recommendations.    Verita Schneiders, MD, [place] for [employer], [place]

## 2017-07-20 NOTE — Patient Instructions (Signed)
Preventive Care 40-64 Years, Female Preventive care refers to lifestyle choices and visits with your health care provider that can promote health and wellness. What does preventive care include?  A yearly physical exam. This is also called an annual well check.  Dental exams once or twice a year.  Routine eye exams. Ask your health care provider how often you should have your eyes checked.  Personal lifestyle choices, including: ? Daily care of your teeth and gums. ? Regular physical activity. ? Eating a healthy diet. ? Avoiding tobacco and drug use. ? Limiting alcohol use. ? Practicing safe sex. ? Taking low-dose aspirin daily starting at age 58. ? Taking vitamin and mineral supplements as recommended by your health care provider. What happens during an annual well check? The services and screenings done by your health care provider during your annual well check will depend on your age, overall health, lifestyle risk factors, and family history of disease. Counseling Your health care provider may ask you questions about your:  Alcohol use.  Tobacco use.  Drug use.  Emotional well-being.  Home and relationship well-being.  Sexual activity.  Eating habits.  Work and work Statistician.  Method of birth control.  Menstrual cycle.  Pregnancy history.  Screening You may have the following tests or measurements:  Height, weight, and BMI.  Blood pressure.  Lipid and cholesterol levels. These may be checked every 5 years, or more frequently if you are over 81 years old.  Skin check.  Lung cancer screening. You may have this screening every year starting at age 78 if you have a 30-pack-year history of smoking and currently smoke or have quit within the past 15 years.  Fecal occult blood test (FOBT) of the stool. You may have this test every year starting at age 65.  Flexible sigmoidoscopy or colonoscopy. You may have a sigmoidoscopy every 5 years or a colonoscopy  every 10 years starting at age 30.  Hepatitis C blood test.  Hepatitis B blood test.  Sexually transmitted disease (STD) testing.  Diabetes screening. This is done by checking your blood sugar (glucose) after you have not eaten for a while (fasting). You may have this done every 1-3 years.  Mammogram. This may be done every 1-2 years. Talk to your health care provider about when you should start having regular mammograms. This may depend on whether you have a family history of breast cancer.  BRCA-related cancer screening. This may be done if you have a family history of breast, ovarian, tubal, or peritoneal cancers.  Pelvic exam and Pap test. This may be done every 3 years starting at age 80. Starting at age 36, this may be done every 5 years if you have a Pap test in combination with an HPV test.  Bone density scan. This is done to screen for osteoporosis. You may have this scan if you are at high risk for osteoporosis.  Discuss your test results, treatment options, and if necessary, the need for more tests with your health care provider. Vaccines Your health care provider may recommend certain vaccines, such as:  Influenza vaccine. This is recommended every year.  Tetanus, diphtheria, and acellular pertussis (Tdap, Td) vaccine. You may need a Td booster every 10 years.  Varicella vaccine. You may need this if you have not been vaccinated.  Zoster vaccine. You may need this after age 5.  Measles, mumps, and rubella (MMR) vaccine. You may need at least one dose of MMR if you were born in  1957 or later. You may also need a second dose.  Pneumococcal 13-valent conjugate (PCV13) vaccine. You may need this if you have certain conditions and were not previously vaccinated.  Pneumococcal polysaccharide (PPSV23) vaccine. You may need one or two doses if you smoke cigarettes or if you have certain conditions.  Meningococcal vaccine. You may need this if you have certain  conditions.  Hepatitis A vaccine. You may need this if you have certain conditions or if you travel or work in places where you may be exposed to hepatitis A.  Hepatitis B vaccine. You may need this if you have certain conditions or if you travel or work in places where you may be exposed to hepatitis B.  Haemophilus influenzae type b (Hib) vaccine. You may need this if you have certain conditions.  Talk to your health care provider about which screenings and vaccines you need and how often you need them. This information is not intended to replace advice given to you by your health care provider. Make sure you discuss any questions you have with your health care provider. Document Released: 04/05/2015 Document Revised: 11/27/2015 Document Reviewed: 01/08/2015 Elsevier Interactive Patient Education  2018 Elsevier Inc.  

## 2017-07-22 LAB — CYTOLOGY - PAP
Diagnosis: NEGATIVE
HPV: NOT DETECTED

## 2017-08-03 ENCOUNTER — Encounter (HOSPITAL_BASED_OUTPATIENT_CLINIC_OR_DEPARTMENT_OTHER): Payer: Self-pay

## 2017-08-03 ENCOUNTER — Ambulatory Visit (HOSPITAL_BASED_OUTPATIENT_CLINIC_OR_DEPARTMENT_OTHER)
Admission: RE | Admit: 2017-08-03 | Discharge: 2017-08-03 | Disposition: A | Discharge status: Home / Self Care | Payer: 59 | Source: Ambulatory Visit | Attending: Obstetrics & Gynecology | Admitting: Obstetrics & Gynecology

## 2017-08-03 ENCOUNTER — Encounter: Payer: Self-pay | Admitting: Obstetrics & Gynecology

## 2017-08-03 DIAGNOSIS — Z86000 Personal history of in-situ neoplasm of breast: Secondary | ICD-10-CM

## 2017-08-03 DIAGNOSIS — Z1231 Encounter for screening mammogram for malignant neoplasm of breast: Secondary | ICD-10-CM | POA: Insufficient documentation

## 2017-08-03 HISTORY — DX: Malignant (primary) neoplasm, unspecified: C80.1

## 2018-01-10 ENCOUNTER — Encounter: Payer: Self-pay | Admitting: Family Medicine

## 2018-01-14 ENCOUNTER — Ambulatory Visit (INDEPENDENT_AMBULATORY_CARE_PROVIDER_SITE_OTHER): Payer: 59 | Admitting: Family Medicine

## 2018-01-14 ENCOUNTER — Encounter: Payer: Self-pay | Admitting: Family Medicine

## 2018-01-14 VITALS — BP 110/70 | HR 96 | Temp 98.4°F | Ht 66.0 in | Wt 183.5 lb

## 2018-01-14 DIAGNOSIS — Z114 Encounter for screening for human immunodeficiency virus [HIV]: Secondary | ICD-10-CM

## 2018-01-14 DIAGNOSIS — F419 Anxiety disorder, unspecified: Secondary | ICD-10-CM | POA: Diagnosis not present

## 2018-01-14 DIAGNOSIS — E785 Hyperlipidemia, unspecified: Secondary | ICD-10-CM

## 2018-01-14 DIAGNOSIS — Z23 Encounter for immunization: Secondary | ICD-10-CM | POA: Diagnosis not present

## 2018-01-14 DIAGNOSIS — Z0000 Encounter for general adult medical examination without abnormal findings: Secondary | ICD-10-CM

## 2018-01-14 DIAGNOSIS — E039 Hypothyroidism, unspecified: Secondary | ICD-10-CM

## 2018-01-14 LAB — COMPREHENSIVE METABOLIC PANEL
ALT: 24 U/L (ref 0–35)
AST: 22 U/L (ref 0–37)
Albumin: 4.8 g/dL (ref 3.5–5.2)
Alkaline Phosphatase: 64 U/L (ref 39–117)
BUN: 19 mg/dL (ref 6–23)
CO2: 30 mEq/L (ref 19–32)
Calcium: 9.7 mg/dL (ref 8.4–10.5)
Chloride: 103 mEq/L (ref 96–112)
Creatinine, Ser: 0.87 mg/dL (ref 0.40–1.20)
GFR: 69.84 mL/min (ref >60.00)
Glucose, Bld: 95 mg/dL (ref 70–99)
Potassium: 4.6 mEq/L (ref 3.5–5.1)
Sodium: 140 mEq/L (ref 135–145)
Total Bilirubin: 0.6 mg/dL (ref 0.2–1.2)
Total Protein: 7.1 g/dL (ref 6.0–8.3)

## 2018-01-14 LAB — LIPID PANEL
Cholesterol: 190 mg/dL (ref 0–200)
HDL: 45.7 mg/dL (ref >39.00)
LDL Cholesterol: 110 mg/dL — ABNORMAL HIGH (ref 0–99)
NonHDL: 144.19
Total CHOL/HDL Ratio: 4
Triglycerides: 172 mg/dL — ABNORMAL HIGH (ref 0.0–149.0)
VLDL: 34.4 mg/dL (ref 0.0–40.0)

## 2018-01-14 LAB — TSH: TSH: 0.13 u[IU]/mL — ABNORMAL LOW (ref 0.35–4.50)

## 2018-01-14 LAB — T4, FREE: Free T4: 0.99 ng/dL (ref 0.60–1.60)

## 2018-01-14 MED ORDER — ATORVASTATIN CALCIUM 10 MG PO TABS: 10.0000 mg | ORAL_TABLET | Freq: Every day | ORAL | 3 refills | Status: DC

## 2018-01-14 MED ORDER — ALPRAZOLAM 0.5 MG PO TABS: 0.5000 mg | ORAL_TABLET | Freq: Every day | ORAL | 5 refills | Status: DC | PRN

## 2018-01-14 MED ORDER — VENLAFAXINE HCL ER 150 MG PO CP24: 150.0000 mg | ORAL_CAPSULE | Freq: Every day | ORAL | 3 refills | Status: DC

## 2018-01-14 NOTE — Progress Notes (Signed)
Chief Complaint  Patient presents with  . Annual Exam    Well Female Elaine Buck is here for a complete physical.   Her last physical was >1 year ago.  Current diet: in general, a "decent" diet.  Current exercise: swimming, active around house Weight trend: losing a little Daytime fatigue? No. Seat belt? Yes.    Health maintenance Shingrix- No Colonoscopy- Yes Tetanus- Yes HIV- No Hep C- Yes Mammogram - Yes Pap- Yes   Past Medical History:  Diagnosis Date  . Anxiety   . Cancer ([place])    breast (Left)  . History of lobular carcinoma in situ (LCIS) of  left breast    s/p lumpectomy in 2005, Tamoxifen/Evista therapy for 5 years  . Hyperlipidemia   . Thyroid disease       Past Surgical History:  Procedure Laterality Date  . BREAST BIOPSY    . BREAST LUMPECTOMY Left 05/22/2003   LCIS Cells found in breast tissue    Medications  Current Outpatient Medications on File Prior to Visit  Medication Sig Dispense Refill  . ALPRAZolam (XANAX) 0.5 MG tablet Take 1 tablet (0.5 mg total) by mouth daily as needed for anxiety. 30 tablet 5  . atorvastatin (LIPITOR) 10 MG tablet Take 1 tablet (10 mg total) by mouth daily. 90 tablet 1  . cyclobenzaprine (FLEXERIL) 10 MG tablet Take 1 tablet (10 mg total) by mouth as needed. 30 tablet 0  . levothyroxine (SYNTHROID, LEVOTHROID) 125 MCG tablet Take 1 tablet (125 mcg total) by mouth daily. 90 tablet 1  . venlafaxine XR (EFFEXOR-XR) 150 MG 24 hr capsule Take 1 capsule (150 mg total) by mouth daily with breakfast. 90 capsule 1   Allergies No Known Allergies  Family History Family History  Problem Relation Age of Onset  . Stroke Mother   . Bipolar disorder Son   . ADD / ADHD Son   . Anxiety disorder Son     Review of Systems: Constitutional:  no fevers Eye:  no recent significant change in vision Ear/Nose/Mouth/Throat:  Ears:  Feels she is starting to lose her hearing Nose/Mouth/Throat:  no complaints of nasal congestion, no  sore throat Cardiovascular:  no chest pain, no palpitations Respiratory:  no cough and no shortness of breath Gastrointestinal:  no abdominal pain, no change in bowel habits GU:  Female: negative for dysuria, frequency, and incontinence and negative for prostate symptoms Musculoskeletal/Extremities:  no pain, redness, or swelling of the joints Integumentary (Skin/Breast):  no abnormal skin lesions reported Neurologic:  no headaches Endocrine: No unexpected weight changes Hematologic/Lymphatic:  no abnormal bleeding  Exam BP 110/70 (BP Location: Left Arm, Patient Position: Sitting, Cuff Size: Normal)   Pulse 96   Temp 98.4 F (36.9 C) (Oral)   Ht 5\' 6"  (1.676 m)   Wt 183 lb 8 oz (83.2 kg)   SpO2 95%   BMI 29.62 kg/m  General:  well developed, well nourished, in no apparent distress Skin:  no significant moles, warts, or growths Head:  no masses, lesions, or tenderness Eyes:  pupils equal and round, sclera anicteric without injection Ears:  canals without lesions, TMs shiny without retraction, no obvious effusion, no erythema Nose:  nares patent, septum midline, mucosa normal Throat/Pharynx:  lips and gingiva without lesion; tongue and uvula midline; non-inflamed pharynx; no exudates or postnasal drainage Neck: neck supple without adenopathy, thyromegaly, or masses Lungs:  clear to auscultation, breath sounds equal bilaterally, no respiratory distress Cardio:  regular rate and rhythm, no LE edema,  no bruits Abdomen:  abdomen soft, nontender; bowel sounds normal; no masses or organomegaly Breast/GU: Deferred Rectal: Deferred Musculoskeletal:  symmetrical muscle groups noted without atrophy or deformity Extremities:  no clubbing, cyanosis, or edema, no deformities, no skin discoloration Neuro:  gait normal; deep tendon reflexes normal and symmetric Psych: well oriented with normal range of affect and appropriate judgment/insight  Assessment and Plan  Well adult exam - Plan:  Comprehensive metabolic panel, Lipid panel  Anxiety - Plan: ALPRAZolam (XANAX) 0.5 MG tablet, venlafaxine XR (EFFEXOR-XR) 150 MG 24 hr capsule, DISCONTINUED: venlafaxine XR (EFFEXOR-XR) 150 MG 24 hr capsule  Hyperlipidemia, unspecified hyperlipidemia type - Plan: atorvastatin (LIPITOR) 10 MG tablet, DISCONTINUED: atorvastatin (LIPITOR) 10 MG tablet  Screening for HIV (human immunodeficiency virus) - Plan: HIV Antibody (routine testing w rflx)  Hypothyroidism, unspecified type - Plan: TSH, T4, free  Need for shingles vaccine - Plan: Varicella-zoster vaccine IM (Shingrix)  Flu vaccine need - Plan: Flu Vaccine QUAD 6+ mos PF IM (Fluarix Quad PF)   Well 62 y.o. female. Counseled on diet and exercise. Sleep hygiene and CBT contact provided. Will see how she does and consider trazodone or low dose doxepin if no improvement.  Immunizations, labs, and further orders as above. Follow up in 6 mo, sooner if not doing well with sleep. The patient voiced understanding and agreement to the plan.  [place], DO 01/14/18 10:28 AM

## 2018-01-14 NOTE — Patient Instructions (Addendum)
Give [place] 2-3 business days to get the results of your labs back.   Stay active and keep the diet clean.  Sleep is important to [place] all. Getting good sleep is imperative to adequate functioning during the day. Work with our counselors who are trained to help people obtain quality sleep. Call [phone] to schedule an appointment or if you are curious about insurance coverage/cost.  Sleep Hygiene Tips:  Do not watch TV or look at screens within 1 hour of going to bed. If you do, make sure there is a blue light filter (nighttime mode) involved.  Try to go to bed around the same time every night. Wake up at the same time within 1 hour of regular time. Ex: If you wake up at 7 AM for work, do not sleep past 8 AM on days that you don't work.  Do not drink alcohol before bedtime.  Do not consume caffeine-containing beverages after noon or within 9 hours of intended bedtime.  Get regular exercise/physical activity in your life, but not within 2 hours of planned bedtime.  Do not take naps.   Do not eat within 2 hours of planned bedtime.  Melatonin, 3-5 mg 30-60 minutes before planned bedtime may be helpful.   The bed should be for sleep or sex only. If after 20-30 minutes you are unable to fall asleep, get up and do something relaxing. Do this until you feel ready to go to sleep again.   Come in sooner if the sleep issues do not improve.  Let us know if you need anything.

## 2018-01-14 NOTE — Progress Notes (Signed)
Pre visit review using our clinic review tool, if applicable. No additional management support is needed unless otherwise documented below in the visit note. 

## 2018-01-15 LAB — HIV ANTIBODY (ROUTINE TESTING W REFLEX): HIV 1&2 Ab, 4th Generation: NONREACTIVE

## 2018-01-16 ENCOUNTER — Encounter: Payer: Self-pay | Admitting: Family Medicine

## 2018-01-16 DIAGNOSIS — F419 Anxiety disorder, unspecified: Secondary | ICD-10-CM

## 2018-01-16 DIAGNOSIS — E785 Hyperlipidemia, unspecified: Secondary | ICD-10-CM

## 2018-01-17 MED ORDER — VENLAFAXINE HCL ER 150 MG PO CP24: 150.0000 mg | ORAL_CAPSULE | Freq: Every day | ORAL | 3 refills | Status: DC

## 2018-01-17 MED ORDER — LEVOTHYROXINE SODIUM 125 MCG PO TABS: 125.0000 ug | ORAL_TABLET | Freq: Every day | ORAL | 1 refills | Status: DC

## 2018-01-17 MED ORDER — ATORVASTATIN CALCIUM 10 MG PO TABS: 10.0000 mg | ORAL_TABLET | Freq: Every day | ORAL | 3 refills | Status: DC

## 2018-03-24 ENCOUNTER — Other Ambulatory Visit: Payer: Self-pay

## 2018-03-24 ENCOUNTER — Encounter: Payer: Self-pay | Admitting: Family Medicine

## 2018-03-24 ENCOUNTER — Ambulatory Visit (INDEPENDENT_AMBULATORY_CARE_PROVIDER_SITE_OTHER): Payer: BLUE CROSS/BLUE SHIELD | Admitting: Family Medicine

## 2018-03-24 VITALS — BP 126/80 | HR 102 | Temp 99.4°F | Resp 20 | Ht 66.0 in | Wt 185.6 lb

## 2018-03-24 DIAGNOSIS — R52 Pain, unspecified: Secondary | ICD-10-CM

## 2018-03-24 DIAGNOSIS — J101 Influenza due to other identified influenza virus with other respiratory manifestations: Secondary | ICD-10-CM

## 2018-03-24 LAB — POC INFLUENZA A&B (BINAX/QUICKVUE)
Influenza A, POC: POSITIVE — AB
Influenza B, POC: NEGATIVE

## 2018-03-24 MED ORDER — HYDROCODONE-HOMATROPINE 5-1.5 MG/5ML PO SYRP: 5.0000 mL | ORAL_SOLUTION | Freq: Three times a day (TID) | ORAL | 0 refills | Status: DC | PRN

## 2018-03-24 MED ORDER — OSELTAMIVIR PHOSPHATE 75 MG PO CAPS: 75.0000 mg | ORAL_CAPSULE | Freq: Two times a day (BID) | ORAL | 0 refills | Status: DC

## 2018-03-24 MED ORDER — HYDROCODONE-HOMATROPINE 5-1.5 MG/5ML PO SYRP: 5.0000 mL | ORAL_SOLUTION | Freq: Three times a day (TID) | ORAL | 0 refills | Status: AC | PRN

## 2018-03-24 NOTE — Addendum Note (Signed)
Addended by: Lamar Blinks C on: 03/24/2018 12:55 PM   Modules accepted: Orders

## 2018-03-24 NOTE — Progress Notes (Signed)
[place] [facility] [address] [place], [place] [address] [phone]  Date:  03/24/2018   Name:  Elaine Buck   DOB:  10-26-1955   MRN:  [id number]  PCP:  Shelda Pal, DO    Chief Complaint: Flu like symptoms (exposure to flu, since yesterday, congestion, chest tightness, body aches, ear pain/teeth pain, 100 temp, headache)   History of Present Illness:  Elaine Buck is a 63 y.o. very pleasant female patient who presents with the following:  Patient of Dr. Nani Ravens whom I have not seen in the past, here today for sick visit She was exposed to the flu recently-her brother-in-law was recently at her house, and then yesterday was diagnosed with the flu. Elaine Buck did have her flu shot this year, but she suspects that she may have come down with influenza She got sick yesterday- chest congestion, body aches, cough, teeth and ears hurt No ST Fever to 100 last night, temperature was actually normal this morning No GI symptoms  History of breast cancer in 2005 Otherwise she is generally in good health. She does use Xanax on occasion for anxiety but does not take this daily  She does plan to have a foot surgery in about 1 week, and wonders if she will still be able to have this procedure  Patient Active Problem List   Diagnosis Date Noted  . History of lobular carcinoma in situ (LCIS) of  left breast   . Anxiety 07/02/2017  . Hypothyroidism 07/02/2017  . Hyperlipidemia 07/02/2017    Past Medical History:  Diagnosis Date  . Anxiety   . Cancer ([place])    breast (Left)  . History of lobular carcinoma in situ (LCIS) of  left breast    s/p lumpectomy in 2005, Tamoxifen/Evista therapy for 5 years  . Hyperlipidemia   . Thyroid disease     Past Surgical History:  Procedure Laterality Date  . BREAST BIOPSY    . BREAST LUMPECTOMY Left 05/22/2003   LCIS Cells found in breast tissue    Social History    Tobacco Use  . Smoking status: Current Every Day Smoker    Packs/day: 0.80    Years: 20.00    Pack years: 16.00    Types: Cigarettes  . Smokeless tobacco: Never Used  . Tobacco comment: Declines quitting for now  Substance Use Topics  . Alcohol use: Yes    Comment: Occasionlly  . Drug use: No    Family History  Problem Relation Age of Onset  . Stroke Mother   . Bipolar disorder Son   . ADD / ADHD Son   . Anxiety disorder Son     No Known Allergies  Medication list has been reviewed and updated.  Current Outpatient Medications on File Prior to Visit  Medication Sig Dispense Refill  . ALPRAZolam (XANAX) 0.5 MG tablet Take 1 tablet (0.5 mg total) by mouth daily as needed for anxiety. 30 tablet 5  . atorvastatin (LIPITOR) 10 MG tablet Take 1 tablet (10 mg total) by mouth daily. 90 tablet 3  . cyclobenzaprine (FLEXERIL) 10 MG tablet Take 1 tablet (10 mg total) by mouth as needed. 30 tablet 0  . levothyroxine (SYNTHROID, LEVOTHROID) 125 MCG tablet Take 1 tablet (125 mcg total) by mouth daily. 90 tablet 1  . venlafaxine XR (EFFEXOR-XR) 150 MG 24 hr capsule Take 1 capsule (150 mg total) by mouth daily with breakfast. 90 capsule 3  No current facility-administered medications on file prior to visit.     Review of Systems:  As per HPI- otherwise negative. No abdominal pain, no rash  Physical Examination: Vitals:   03/24/18 1058  BP: 126/80  Pulse: (!) 102  Resp: 20  SpO2: 98%   Vitals:   03/24/18 1058  Weight: 185 lb 9.6 oz (84.2 kg)  Height: 5\' 6"  (1.676 m)   Body mass index is 29.96 kg/m. Ideal Body Weight: Weight in (lb) to have BMI = 25: 154.6  GEN: WDWN, NAD, Non-toxic, A & O x 3, overweight, looks well HEENT: Atraumatic, Normocephalic. Neck supple. No masses, No LAD. Bilateral TM wnl, oropharynx normal.  PEERL,EOMI.   Ears and Nose: No external deformity. CV: RRR, No M/G/R. No JVD. No thrill. No extra heart sounds. PULM: CTA B, no wheezes, crackles,  rhonchi. No retractions. No resp. distress. No accessory muscle use. ABD: S, NT, ND. No rebound. No HSM. EXTR: No c/c/e NEURO Normal gait.  PSYCH: Normally interactive. Conversant. Not depressed or anxious appearing.  Calm demeanor.   Results for orders placed or performed in visit on 03/24/18  POC Influenza A&B (Binax test)  Result Value Ref Range   Influenza A, POC Positive (A) Negative   Influenza B, POC Negative Negative    Assessment and Plan: Influenza A - Plan: HYDROcodone-homatropine (HYCODAN) 5-1.5 MG/5ML syrup, oseltamivir (TAMIFLU) 75 MG capsule  Body aches - Plan: POC Influenza A&B (Binax test)  Here today with illness, diagnosed with influenza A. Symptoms began yesterday so we are in the window for Tamiflu.  She would like to begin on Tamiflu, and also would appreciate a prescription for cough syrup. Went over how to use Tamiflu and also Hycodan. Hycodan can be sedating, do not use when driving or combine with any other sedating medication. Advised that she likely can still have her foot surgery as planned next week, but did ask her to alert her surgeon of her diagnosis. She is also asked to alert [place] if not feeling better the next few days, or if getting worse  Signed Lamar Blinks, MD

## 2018-03-24 NOTE — Patient Instructions (Signed)
It was nice to see you today, but I am sorry that you have the flu. Please rest, drink plenty of fluids, and use ibuprofen or Tylenol as needed for fever and pain. For cough, you can use the prescription cough syrup.  This will make you drowsy, do not use it when you need to drive.  Also do not combine with any other sedating medications. We prescribed Tamiflu to help treat your flu virus.  Take this twice a day for 5 days. I think you should be okay to have your foot surgery next week as planned, but please do alert your surgeon that you have the flu. If you are getting worse, or have any other concerns please do let us know

## 2018-05-11 ENCOUNTER — Encounter: Payer: Self-pay | Admitting: Radiology

## 2018-05-31 ENCOUNTER — Other Ambulatory Visit: Payer: Self-pay | Admitting: *Deleted

## 2018-05-31 ENCOUNTER — Encounter: Payer: Self-pay | Admitting: *Deleted

## 2018-05-31 DIAGNOSIS — Z01419 Encounter for gynecological examination (general) (routine) without abnormal findings: Secondary | ICD-10-CM

## 2018-06-20 ENCOUNTER — Encounter: Payer: Self-pay | Admitting: Family Medicine

## 2018-06-20 MED ORDER — LEVOTHYROXINE SODIUM 125 MCG PO TABS: 125.0000 ug | ORAL_TABLET | Freq: Every day | ORAL | 1 refills | Status: DC

## 2018-07-18 ENCOUNTER — Telehealth: Payer: Self-pay | Admitting: Family Medicine

## 2018-07-18 ENCOUNTER — Other Ambulatory Visit: Payer: Self-pay

## 2018-07-18 ENCOUNTER — Encounter: Payer: Self-pay | Admitting: Family Medicine

## 2018-07-18 ENCOUNTER — Ambulatory Visit (INDEPENDENT_AMBULATORY_CARE_PROVIDER_SITE_OTHER): Payer: BLUE CROSS/BLUE SHIELD | Admitting: Family Medicine

## 2018-07-18 DIAGNOSIS — E039 Hypothyroidism, unspecified: Secondary | ICD-10-CM

## 2018-07-18 DIAGNOSIS — E785 Hyperlipidemia, unspecified: Secondary | ICD-10-CM | POA: Diagnosis not present

## 2018-07-18 DIAGNOSIS — F419 Anxiety disorder, unspecified: Secondary | ICD-10-CM

## 2018-07-18 DIAGNOSIS — M545 Low back pain, unspecified: Secondary | ICD-10-CM

## 2018-07-18 MED ORDER — ALPRAZOLAM 0.5 MG PO TABS: 0.5000 mg | ORAL_TABLET | Freq: Every day | ORAL | 5 refills | Status: DC | PRN

## 2018-07-18 MED ORDER — ATORVASTATIN CALCIUM 10 MG PO TABS: 10.0000 mg | ORAL_TABLET | Freq: Every day | ORAL | 3 refills | Status: DC

## 2018-07-18 MED ORDER — VENLAFAXINE HCL ER 150 MG PO CP24: 150.0000 mg | ORAL_CAPSULE | Freq: Every day | ORAL | 2 refills | Status: DC

## 2018-07-18 MED ORDER — MELOXICAM 15 MG PO TABS: 15.0000 mg | ORAL_TABLET | Freq: Every day | ORAL | 0 refills | Status: DC

## 2018-07-18 MED ORDER — LEVOTHYROXINE SODIUM 125 MCG PO TABS: 125.0000 ug | ORAL_TABLET | Freq: Every day | ORAL | 2 refills | Status: DC

## 2018-07-18 NOTE — Telephone Encounter (Signed)
LVM for pt to call and schedule CPE and labs

## 2018-07-18 NOTE — Progress Notes (Addendum)
CC: Med check  Subjective Elaine Buck presents for f/u anxiety. Due to COVID-19 pandemic, we are interacting via web portal for an electronic face-to-face visit. I verified patient's ID using 2 identifiers. Patient agreed to proceed with visit via this method. Patient is at home, I am at office. Patient and I are present for visit.   She is currently being treated with Effexor 150 mg/d and Xanax prn.  Anxiety triggered by situation of son with bipolar living with them. Content with current med set up. No thoughts of harming self or others. No self-medication with alcohol, prescription drugs or illicit drugs. Pt is not following with a counselor/psychologist.  Hyperlipidemia Patient presents for dyslipidemia follow up. Currently being treated with Lipitor 10 mg/d and compliance with treatment thus far has been good. She denies myalgias. She is usually adhering to a healthy. Exercise: active with gardening The patient is not known to have coexisting coronary artery disease.  Hypothyroidism Patient presents for follow-up of hypothyroidism.  Reports compliance with medication. Current symptoms include: denies fatigue, weight changes, heat/cold intolerance, bowel/skin changes or CVS symptoms She believes her dose should be unchanged  Low back pain started after gardening and bending over repeatedly doing yardwork. No swelling/bruising/redness. No stretching/exercises. Has used ibuprofen without sig relief. No neurologic s/s's.   ROS Psych: No homicidal or suicidal thoughts Endo: No unexplained wt loss  Past Medical History:  Diagnosis Date  . Anxiety   . Cancer ([place])    breast (Left)  . History of lobular carcinoma in situ (LCIS) of  left breast    s/p lumpectomy in 2005, Tamoxifen/Evista therapy for 5 years  . Hyperlipidemia   . Thyroid disease    Allergies as of 07/18/2018   No Known Allergies     Medication List       Accurate as of July 18, 2018  9:24 AM. Always use  your most recent med list.        ALPRAZolam 0.5 MG tablet Commonly known as:  XANAX Take 1 tablet (0.5 mg total) by mouth daily as needed for anxiety.   atorvastatin 10 MG tablet Commonly known as:  LIPITOR Take 1 tablet (10 mg total) by mouth daily.   levothyroxine 125 MCG tablet Commonly known as:  SYNTHROID Take 1 tablet (125 mcg total) by mouth daily.   meloxicam 15 MG tablet Commonly known as:  MOBIC Take 1 tablet (15 mg total) by mouth daily.   venlafaxine XR 150 MG 24 hr capsule Commonly known as:  EFFEXOR-XR Take 1 capsule (150 mg total) by mouth daily with breakfast.       Exam No conversational dyspnea Age appropriate judgment and insight Nml affect and mood  Assessment and Plan  Hyperlipidemia, unspecified hyperlipidemia type - Plan: Comprehensive metabolic panel, Lipid panel, atorvastatin (LIPITOR) 10 MG tablet  Acute bilateral low back pain without sciatica - Plan: meloxicam (MOBIC) 15 MG tablet  Hypothyroidism, unspecified type - Plan: levothyroxine (SYNTHROID) 125 MCG tablet  Anxiety - Plan: ALPRAZolam (XANAX) 0.5 MG tablet, venlafaxine XR (EFFEXOR-XR) 150 MG 24 hr capsule  1- Cont Lipitor. Counseled on diet and exercise. 2- Stretches/exercises, heat, NSAID, Tylenol.  3- Cont Synthroid. 4- Cont above. F/u in 6 mo for CPE; earliest convenience for labs and Shingrix. The patient voiced understanding and agreement to the plan.  [place], DO 07/18/18 9:24 AM

## 2018-07-19 ENCOUNTER — Other Ambulatory Visit: Payer: Self-pay | Admitting: Family Medicine

## 2018-07-19 DIAGNOSIS — F419 Anxiety disorder, unspecified: Secondary | ICD-10-CM

## 2018-07-19 MED ORDER — ALPRAZOLAM 0.5 MG PO TABS: 0.5000 mg | ORAL_TABLET | Freq: Every day | ORAL | 5 refills | Status: DC | PRN

## 2018-07-19 MED ORDER — TIZANIDINE HCL 4 MG PO TABS: 4.0000 mg | ORAL_TABLET | Freq: Three times a day (TID) | ORAL | 0 refills | Status: DC | PRN

## 2018-08-04 ENCOUNTER — Ambulatory Visit: Payer: BLUE CROSS/BLUE SHIELD | Admitting: Obstetrics & Gynecology

## 2018-08-19 ENCOUNTER — Other Ambulatory Visit: Payer: Self-pay

## 2018-08-19 ENCOUNTER — Other Ambulatory Visit (INDEPENDENT_AMBULATORY_CARE_PROVIDER_SITE_OTHER): Payer: BLUE CROSS/BLUE SHIELD

## 2018-08-19 ENCOUNTER — Ambulatory Visit (INDEPENDENT_AMBULATORY_CARE_PROVIDER_SITE_OTHER): Payer: BLUE CROSS/BLUE SHIELD

## 2018-08-19 DIAGNOSIS — Z23 Encounter for immunization: Secondary | ICD-10-CM

## 2018-08-19 DIAGNOSIS — E785 Hyperlipidemia, unspecified: Secondary | ICD-10-CM | POA: Diagnosis not present

## 2018-08-19 LAB — COMPREHENSIVE METABOLIC PANEL
ALT: 19 U/L (ref 0–35)
AST: 20 U/L (ref 0–37)
Albumin: 4.1 g/dL (ref 3.5–5.2)
Alkaline Phosphatase: 61 U/L (ref 39–117)
BUN: 15 mg/dL (ref 6–23)
CO2: 29 mEq/L (ref 19–32)
Calcium: 9 mg/dL (ref 8.4–10.5)
Chloride: 106 mEq/L (ref 96–112)
Creatinine, Ser: 0.72 mg/dL (ref 0.40–1.20)
GFR: 81.59 mL/min (ref >60.00)
Glucose, Bld: 87 mg/dL (ref 70–99)
Potassium: 4 mEq/L (ref 3.5–5.1)
Sodium: 143 mEq/L (ref 135–145)
Total Bilirubin: 0.5 mg/dL (ref 0.2–1.2)
Total Protein: 6.3 g/dL (ref 6.0–8.3)

## 2018-08-19 LAB — LIPID PANEL
Cholesterol: 164 mg/dL (ref 0–200)
HDL: 45.1 mg/dL (ref >39.00)
LDL Cholesterol: 88 mg/dL (ref 0–99)
NonHDL: 118.93
Total CHOL/HDL Ratio: 4
Triglycerides: 155 mg/dL — ABNORMAL HIGH (ref 0.0–149.0)
VLDL: 31 mg/dL (ref 0.0–40.0)

## 2018-08-19 NOTE — Progress Notes (Signed)
Pre visit review using our clinic review tool, if applicable. No additional management support is needed unless otherwise documented below in the visit note.  Patient here today for second shingrix vaccine. 0.68ml IM shingrix given in patients right deltoid. VIS given. Pt tolerated well.

## 2018-08-22 ENCOUNTER — Encounter (HOSPITAL_BASED_OUTPATIENT_CLINIC_OR_DEPARTMENT_OTHER): Payer: Self-pay

## 2018-08-22 ENCOUNTER — Ambulatory Visit (HOSPITAL_BASED_OUTPATIENT_CLINIC_OR_DEPARTMENT_OTHER)
Admission: RE | Admit: 2018-08-22 | Discharge: 2018-08-22 | Disposition: A | Discharge status: Home / Self Care | Payer: BLUE CROSS/BLUE SHIELD | Source: Ambulatory Visit | Attending: Obstetrics & Gynecology | Admitting: Obstetrics & Gynecology

## 2018-08-22 ENCOUNTER — Other Ambulatory Visit: Payer: Self-pay

## 2018-08-22 DIAGNOSIS — Z1231 Encounter for screening mammogram for malignant neoplasm of breast: Secondary | ICD-10-CM | POA: Diagnosis present

## 2018-08-22 DIAGNOSIS — Z01419 Encounter for gynecological examination (general) (routine) without abnormal findings: Secondary | ICD-10-CM

## 2018-08-22 IMAGING — MG DIGITAL SCREENING BILATERAL MAMMOGRAM WITH TOMO AND CAD
8 series · 8 of 24 positions shown · non-contrast
Comparison: Previous exam(s).

CLINICAL DATA: Screening.

EXAM:
DIGITAL SCREENING BILATERAL MAMMOGRAM WITH TOMO AND CAD

[R CC synth-2D]
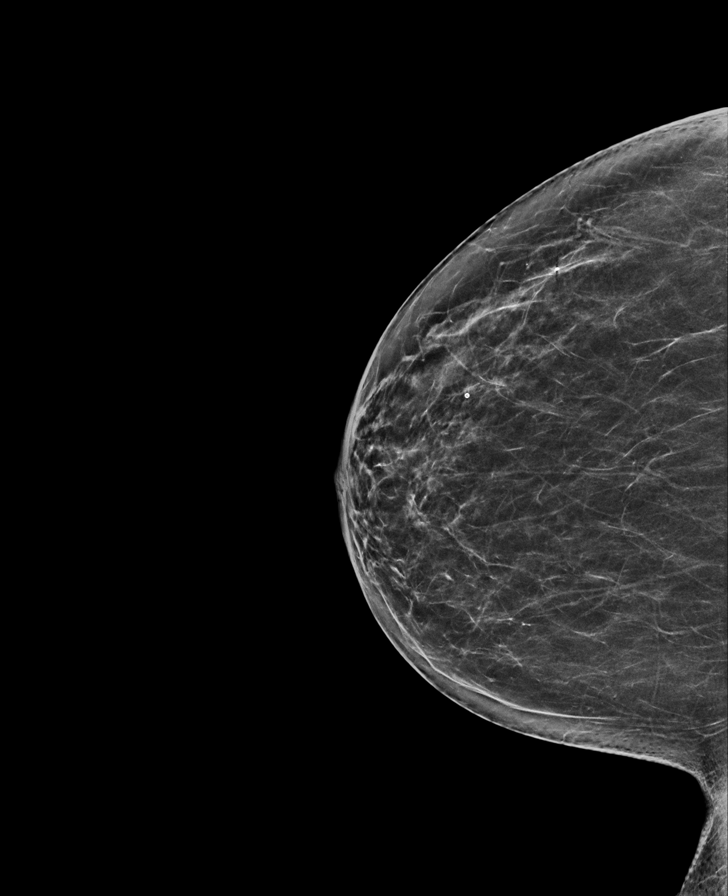

[R MLO synth-2D]
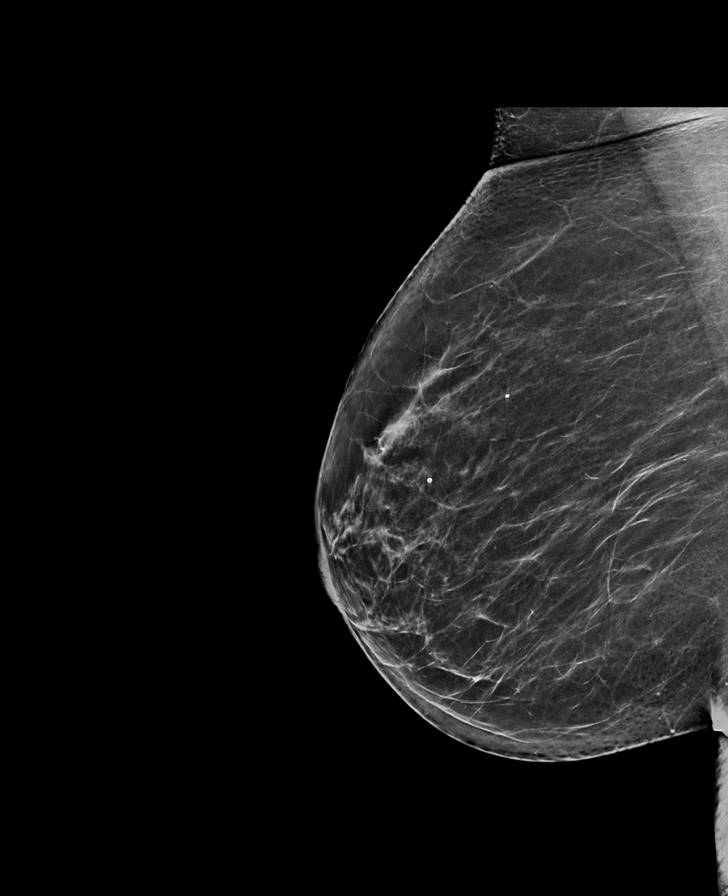

[L CC synth-2D]
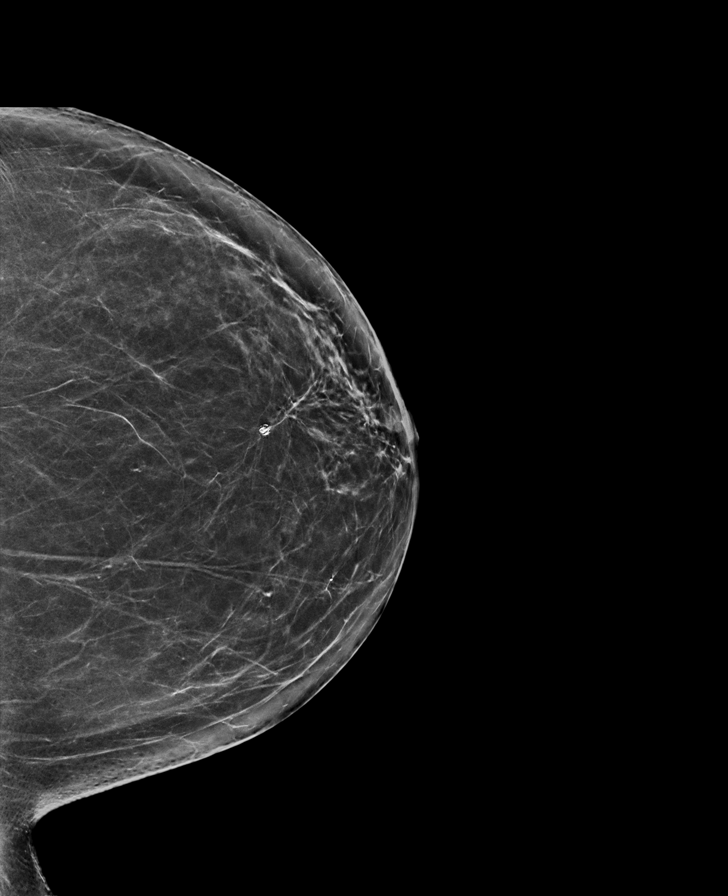

[L MLO synth-2D]
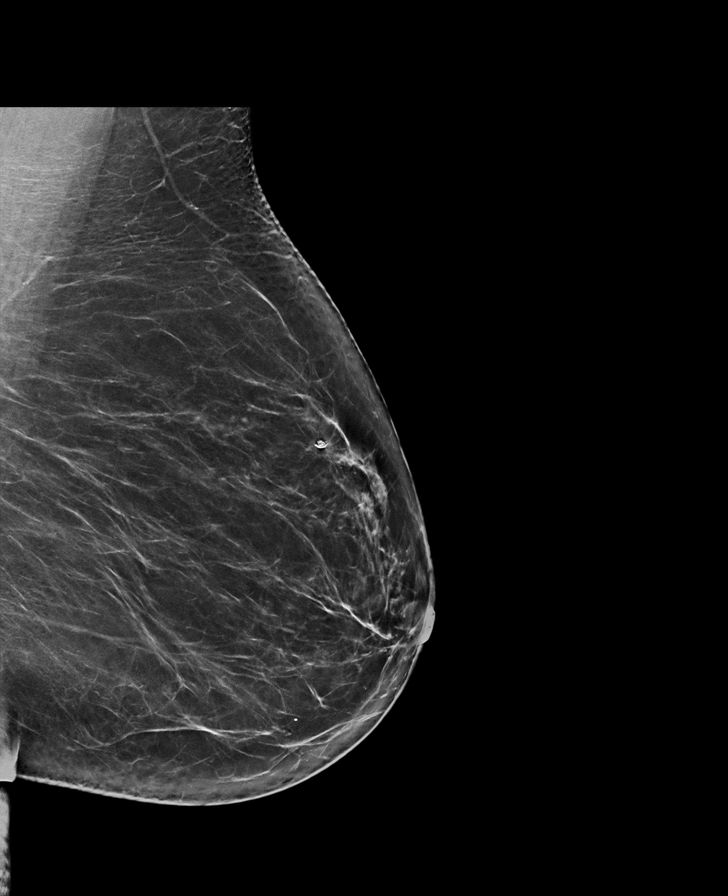

[R CC tomo · tomo slice 33/64.0]
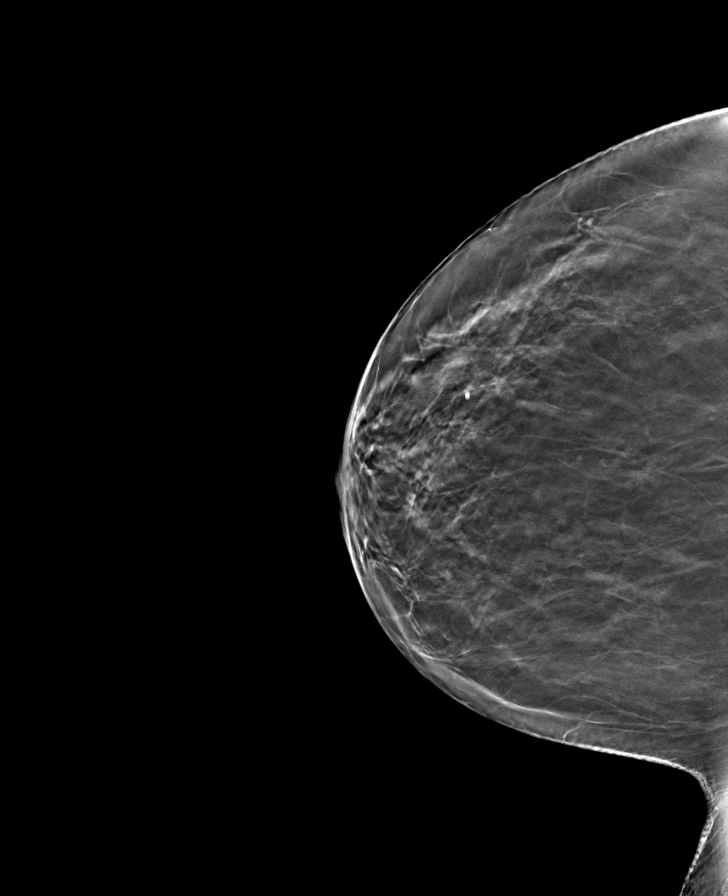

[R MLO tomo · tomo slice 37/73.0]
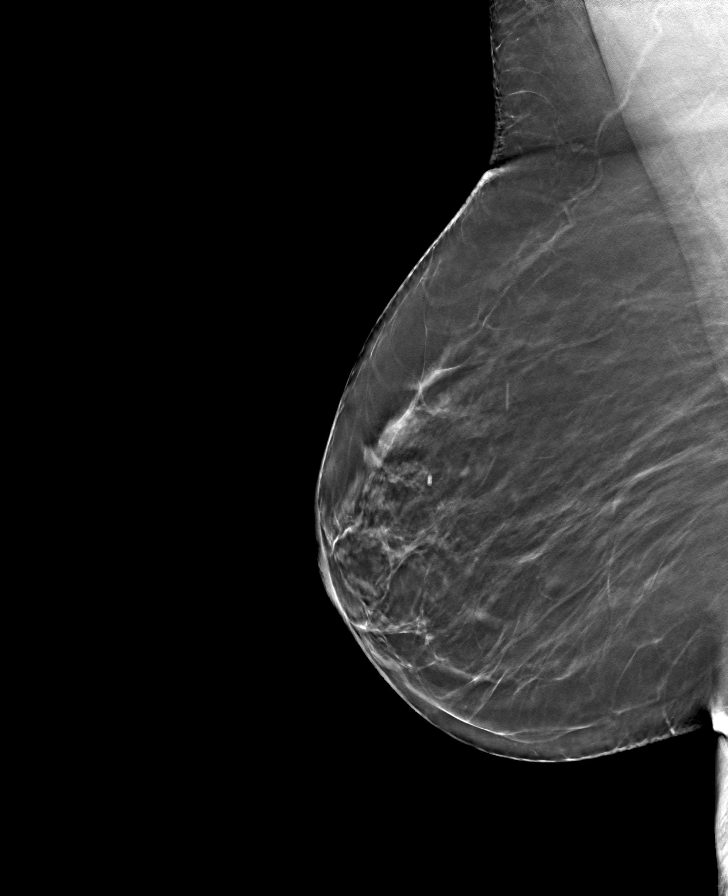

[L MLO tomo · tomo slice 39/77.0]
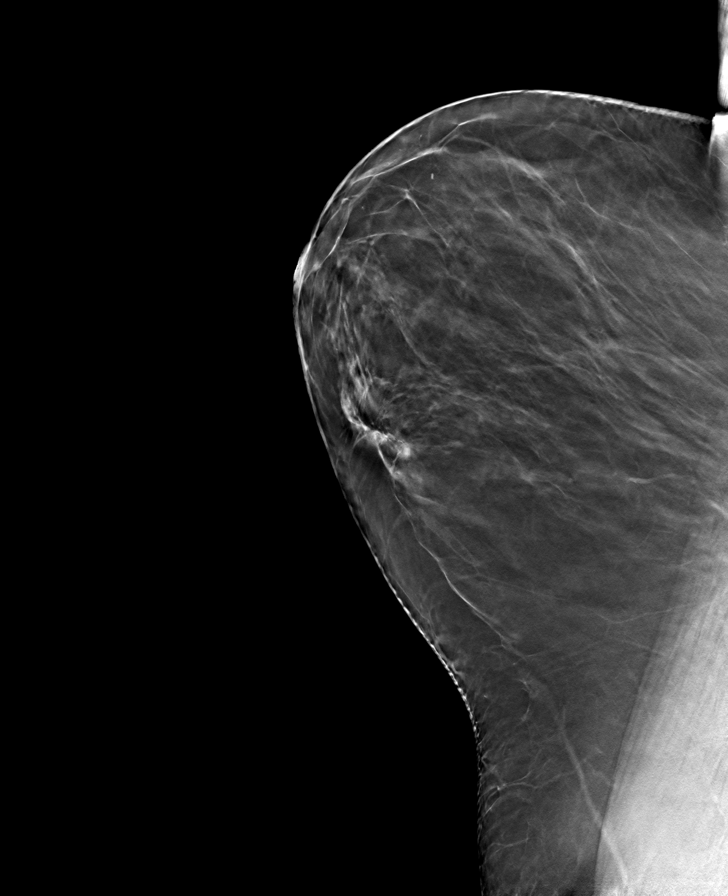

[L CC tomo · tomo slice 35/68.0]
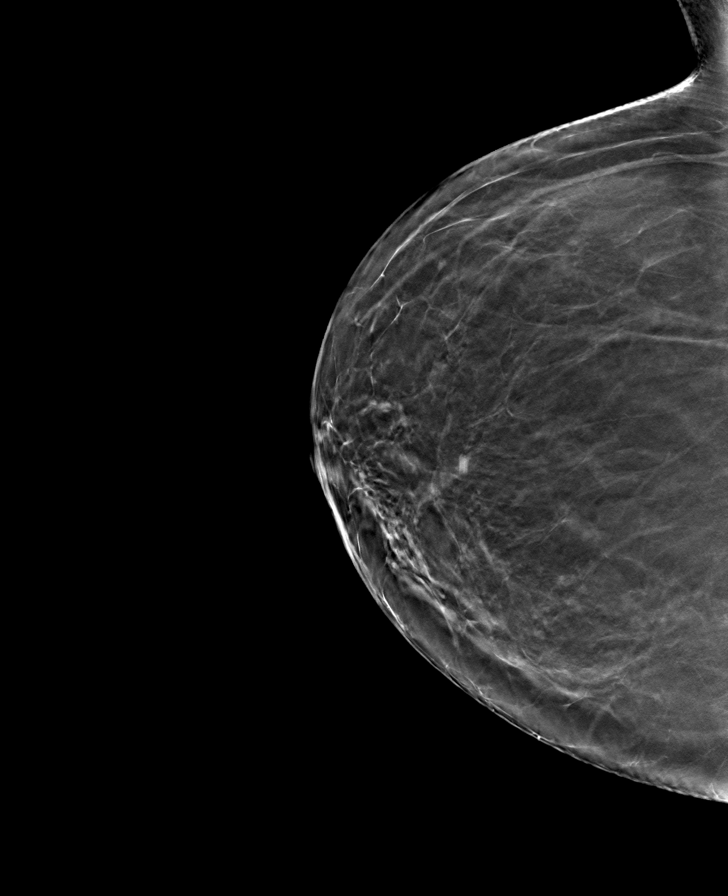

[8 of 24 positions shown; findings below may reference images not displayed]

ACR Breast Density Category b: There are scattered areas of
fibroglandular density.
FINDINGS: There are no findings suspicious for malignancy. Images were
processed with CAD.
IMPRESSION: No mammographic evidence of malignancy. A result letter of this
screening mammogram will be mailed directly to the patient.

RECOMMENDATION:
Screening mammogram in one year. (Code:[TQ])

BI-RADS CATEGORY  1: Negative.

## 2018-09-13 ENCOUNTER — Other Ambulatory Visit: Payer: Self-pay

## 2018-09-13 ENCOUNTER — Ambulatory Visit (INDEPENDENT_AMBULATORY_CARE_PROVIDER_SITE_OTHER): Payer: BLUE CROSS/BLUE SHIELD | Admitting: Obstetrics & Gynecology

## 2018-09-13 ENCOUNTER — Encounter: Payer: Self-pay | Admitting: Obstetrics & Gynecology

## 2018-09-13 VITALS — BP 107/71 | HR 72 | Wt 182.4 lb

## 2018-09-13 DIAGNOSIS — Z1151 Encounter for screening for human papillomavirus (HPV): Secondary | ICD-10-CM | POA: Diagnosis not present

## 2018-09-13 DIAGNOSIS — Z124 Encounter for screening for malignant neoplasm of cervix: Secondary | ICD-10-CM | POA: Diagnosis not present

## 2018-09-13 DIAGNOSIS — Z01419 Encounter for gynecological examination (general) (routine) without abnormal findings: Secondary | ICD-10-CM

## 2018-09-13 NOTE — Progress Notes (Signed)
GYNECOLOGY ANNUAL PREVENTATIVE CARE ENCOUNTER NOTE  History:     Elaine Buck is a 63 y.o. G75P2012 female here for a routine annual gynecologic exam.  Current complaints: none.   Denies abnormal vaginal bleeding, discharge, pelvic pain, problems with intercourse or other gynecologic concerns.    Gynecologic History No LMP recorded. Patient is postmenopausal. Last Pap: 06/2018. Results were: normal with negative HPV Last mammogram: 07/2018. Results were: normal  Obstetric History OB History  Gravida Para Term Preterm AB Living  3 2 2   1 2   SAB TAB Ectopic Multiple Live Births  1       2    # Outcome Date GA Lbr Len/2nd Weight Sex Delivery Anes PTL Lv  3 SAB           2 Term           1 Term             Past Medical History:  Diagnosis Date  . Anxiety   . Cancer ([place])    breast (Left)  . History of lobular carcinoma in situ (LCIS) of  left breast    s/p lumpectomy in 2005, Tamoxifen/Evista therapy for 5 years  . Hyperlipidemia   . Thyroid disease     Past Surgical History:  Procedure Laterality Date  . BREAST BIOPSY    . BREAST LUMPECTOMY Left 05/22/2003   LCIS Cells found in breast tissue    Current Outpatient Medications on File Prior to Visit  Medication Sig Dispense Refill  . ALPRAZolam (XANAX) 0.5 MG tablet Take 1 tablet (0.5 mg total) by mouth daily as needed for anxiety. 30 tablet 5  . atorvastatin (LIPITOR) 10 MG tablet Take 1 tablet (10 mg total) by mouth daily. 90 tablet 3  . levothyroxine (SYNTHROID) 125 MCG tablet Take 1 tablet (125 mcg total) by mouth daily. 90 tablet 2  . venlafaxine XR (EFFEXOR-XR) 150 MG 24 hr capsule Take 1 capsule (150 mg total) by mouth daily with breakfast. 90 capsule 2  . meloxicam (MOBIC) 15 MG tablet Take 1 tablet (15 mg total) by mouth daily. (Patient not taking: Reported on 09/13/2018) 30 tablet 0  . tiZANidine (ZANAFLEX) 4 MG tablet Take 1 tablet (4 mg total) by mouth every 8 (eight) hours as needed for muscle spasms.  (Patient not taking: Reported on 09/13/2018) 30 tablet 0   No current facility-administered medications on file prior to visit.     No Known Allergies  Social History:  reports that she has been smoking cigarettes. She has a 16.00 pack-year smoking history. She has never used smokeless tobacco. She reports current alcohol use. She reports that she does not use drugs.  Family History  Problem Relation Age of Onset  . Stroke Mother   . Bipolar disorder Son   . ADD / ADHD Son   . Anxiety disorder Son     The following portions of the patient's history were reviewed and updated as appropriate: allergies, current medications, past family history, past medical history, past social history, past surgical history and problem list.  Review of Systems Pertinent items noted in HPI and remainder of comprehensive ROS otherwise negative.  Physical Exam:  BP 107/71   Pulse 72   Wt 182 lb 6.4 oz (82.7 kg)   BMI 29.44 kg/m  CONSTITUTIONAL: Well-developed, well-nourished female in no acute distress.  HENT:  Normocephalic, atraumatic, External right and left ear normal. Oropharynx is clear and moist EYES: Conjunctivae and EOM are  normal. Pupils are equal, round, and reactive to light. No scleral icterus.  NECK: Normal range of motion, supple, no masses.  Normal thyroid.  SKIN: Skin is warm and dry. No rash noted. Not diaphoretic. No erythema. No pallor. MUSCULOSKELETAL: Normal range of motion. No tenderness.  No cyanosis, clubbing, or edema.  2+ distal pulses. NEUROLOGIC: Alert and oriented to person, place, and time. Normal reflexes, muscle tone coordination. No cranial nerve deficit noted. PSYCHIATRIC: Normal mood and affect. Normal behavior. Normal judgment and thought content. CARDIOVASCULAR: Normal heart rate noted, regular rhythm RESPIRATORY: Clear to auscultation bilaterally. Effort and breath sounds normal, no problems with respiration noted. BREASTS: Symmetric in size. No masses, skin  changes, nipple drainage, or lymphadenopathy. ABDOMEN: Soft, normal bowel sounds, no distention noted.  No tenderness, rebound or guarding.  PELVIC: Normal appearing external genitalia; normal appearing vaginal mucosa and cervix with moderate atrophy noted.  Postmenopausal cervical stenosis noted, no endocervical cells obtained.  No abnormal discharge noted.  Pap smear obtained.  Normal uterine size, no other palpable masses, no uterine or adnexal tenderness.   Assessment and Plan:    1. Well woman exam with routine gynecological exam - Cytology - PAP Will follow up results of pap smear and manage accordingly. Mammogram is up-to-date Routine preventative health maintenance measures emphasized. Please refer to After Visit Summary for other counseling recommendations.      Verita Schneiders, MD, [place] for [employer], [place]

## 2018-09-13 NOTE — Patient Instructions (Signed)
Preventive Care 40-64 Years, Female Preventive care refers to lifestyle choices and visits with your health care provider that can promote health and wellness. What does preventive care include?   A yearly physical exam. This is also called an annual well check.  Dental exams once or twice a year.  Routine eye exams. Ask your health care provider how often you should have your eyes checked.  Personal lifestyle choices, including: ? Daily care of your teeth and gums. ? Regular physical activity. ? Eating a healthy diet. ? Avoiding tobacco and drug use. ? Limiting alcohol use. ? Practicing safe sex. ? Taking low-dose aspirin daily starting at age 50. ? Taking vitamin and mineral supplements as recommended by your health care provider. What happens during an annual well check? The services and screenings done by your health care provider during your annual well check will depend on your age, overall health, lifestyle risk factors, and family history of disease. Counseling Your health care provider may ask you questions about your:  Alcohol use.  Tobacco use.  Drug use.  Emotional well-being.  Home and relationship well-being.  Sexual activity.  Eating habits.  Work and work environment.  Method of birth control.  Menstrual cycle.  Pregnancy history. Screening You may have the following tests or measurements:  Height, weight, and BMI.  Blood pressure.  Lipid and cholesterol levels. These may be checked every 5 years, or more frequently if you are over 50 years old.  Skin check.  Lung cancer screening. You may have this screening every year starting at age 55 if you have a 30-pack-year history of smoking and currently smoke or have quit within the past 15 years.  Colorectal cancer screening. All adults should have this screening starting at age 50 and continuing until age 75. Your health care provider may recommend screening at age 45. You will have tests every  1-10 years, depending on your results and the type of screening test. People at increased risk should start screening at an earlier age. Screening tests may include: ? Guaiac-based fecal occult blood testing. ? Fecal immunochemical test (FIT). ? Stool DNA test. ? Virtual colonoscopy. ? Sigmoidoscopy. During this test, a flexible tube with a tiny camera (sigmoidoscope) is used to examine your rectum and lower colon. The sigmoidoscope is inserted through your anus into your rectum and lower colon. ? Colonoscopy. During this test, a long, thin, flexible tube with a tiny camera (colonoscope) is used to examine your entire colon and rectum.  Hepatitis C blood test.  Hepatitis B blood test.  Sexually transmitted disease (STD) testing.  Diabetes screening. This is done by checking your blood sugar (glucose) after you have not eaten for a while (fasting). You may have this done every 1-3 years.  Mammogram. This may be done every 1-2 years. Talk to your health care provider about when you should start having regular mammograms. This may depend on whether you have a family history of breast cancer.  BRCA-related cancer screening. This may be done if you have a family history of breast, ovarian, tubal, or peritoneal cancers.  Pelvic exam and Pap test. This may be done every 3 years starting at age 21. Starting at age 30, this may be done every 5 years if you have a Pap test in combination with an HPV test.  Bone density scan. This is done to screen for osteoporosis. You may have this scan if you are at high risk for osteoporosis. Discuss your test results, treatment options,   and if necessary, the need for more tests with your health care provider. Vaccines Your health care provider may recommend certain vaccines, such as:  Influenza vaccine. This is recommended every year.  Tetanus, diphtheria, and acellular pertussis (Tdap, Td) vaccine. You may need a Td booster every 10 years.  Varicella  vaccine. You may need this if you have not been vaccinated.  Zoster vaccine. You may need this after age 31.  Measles, mumps, and rubella (MMR) vaccine. You may need at least one dose of MMR if you were born in 1957 or later. You may also need a second dose.  Pneumococcal 13-valent conjugate (PCV13) vaccine. You may need this if you have certain conditions and were not previously vaccinated.  Pneumococcal polysaccharide (PPSV23) vaccine. You may need one or two doses if you smoke cigarettes or if you have certain conditions.  Meningococcal vaccine. You may need this if you have certain conditions.  Hepatitis A vaccine. You may need this if you have certain conditions or if you travel or work in places where you may be exposed to hepatitis A.  Hepatitis B vaccine. You may need this if you have certain conditions or if you travel or work in places where you may be exposed to hepatitis B.  Haemophilus influenzae type b (Hib) vaccine. You may need this if you have certain conditions. Talk to your health care provider about which screenings and vaccines you need and how often you need them. This information is not intended to replace advice given to you by your health care provider. Make sure you discuss any questions you have with your health care provider. Document Released: 04/05/2015 Document Revised: 04/29/2017 Document Reviewed: 01/08/2015 Elsevier Interactive Patient Education  2019 [employer].

## 2018-09-15 LAB — CYTOLOGY - PAP
Diagnosis: NEGATIVE
HPV: NOT DETECTED

## 2018-11-09 ENCOUNTER — Encounter: Payer: Self-pay | Admitting: Family Medicine

## 2018-11-09 ENCOUNTER — Ambulatory Visit (INDEPENDENT_AMBULATORY_CARE_PROVIDER_SITE_OTHER): Payer: BLUE CROSS/BLUE SHIELD | Admitting: Family Medicine

## 2018-11-09 ENCOUNTER — Other Ambulatory Visit: Payer: Self-pay

## 2018-11-09 VITALS — BP 120/64 | HR 100 | Temp 97.5°F | Ht 66.0 in | Wt 183.1 lb

## 2018-11-09 DIAGNOSIS — M545 Low back pain, unspecified: Secondary | ICD-10-CM

## 2018-11-09 MED ORDER — METHOCARBAMOL 500 MG PO TABS: 500.0000 mg | ORAL_TABLET | Freq: Four times a day (QID) | ORAL | 0 refills | Status: DC

## 2018-11-09 MED ORDER — METHYLPREDNISOLONE ACETATE 80 MG/ML IJ SUSP
80.0000 mg | Freq: Once | INTRAMUSCULAR | Status: AC
  Administered 2018-11-09: 80 mg via INTRAMUSCULAR

## 2018-11-09 MED ORDER — PREDNISONE 20 MG PO TABS: 40.0000 mg | ORAL_TABLET | Freq: Every day | ORAL | 0 refills | Status: AC

## 2018-11-09 MED ORDER — TRAMADOL HCL 50 MG PO TABS: 50.0000 mg | ORAL_TABLET | Freq: Two times a day (BID) | ORAL | 0 refills | Status: DC | PRN

## 2018-11-09 NOTE — Patient Instructions (Addendum)

## 2018-11-09 NOTE — Progress Notes (Signed)
Musculoskeletal Exam  Patient: Elaine Buck DOB: 1956-03-18  DOS: 11/09/2018  SUBJECTIVE:  Chief Complaint:   Chief Complaint  Patient presents with  . Back Pain    Elaine Buck is a 63 y.o.  female for evaluation and treatment of her back pain.   Onset:  6 days ago. No injr or change in activity.   +hx of bulging disc, will get a flare like this every 15 yrs or so.  Location: R lower back Character:  sharp  Progression of issue:  is unchanged Associated symptoms: denies swelling, bruising, redness Denies bowel/bladder incontinence or weakness Treatment: to date has been muscle relaxant, heat, Biofreeze.   Neurovascular symptoms: no  ROS: Musculoskeletal/Extremities: +back pain Neurologic: no numbness, tingling no weakness   Past Medical History:  Diagnosis Date  . Anxiety   . Cancer ([place])    breast (Left)  . History of lobular carcinoma in situ (LCIS) of  left breast    s/p lumpectomy in 2005, Tamoxifen/Evista therapy for 5 years  . Hyperlipidemia   . Thyroid disease     Objective:  VITAL SIGNS: BP 120/64 (BP Location: Left Arm, Patient Position: Sitting, Cuff Size: Normal)   Pulse 100   Temp (!) 97.5 F (36.4 C) (Oral)   Ht 5\' 6"  (1.676 m)   Wt 183 lb 2 oz (83.1 kg)   SpO2 95%   BMI 29.56 kg/m  Constitutional: Well formed, well developed. No acute distress. HENT: Normocephalic, atraumatic.  Thorax & Lungs:  No accessory muscle use Skin: Warm. Dry. No erythema. No rash.  Musculoskeletal: low back.   Tenderness to palpation: yes, lower R parasp msc near L5/S1 Deformity: no Ecchymosis: no Straight leg test: negative for Poor hamstring flexibility b/l. Neurologic: Normal sensory function. No focal deficits noted. DTR's equal and symmetric in LE's. No clonus. Psychiatric: Normal mood. Age appropriate judgment and insight. Alert & oriented x 3.    Assessment:  Acute right-sided low back pain without sciatica - Plan: predniSONE (DELTASONE) 20 MG  tablet, traMADol (ULTRAM) 50 MG tablet, methocarbamol (ROBAXIN) 500 MG tablet, methylPREDNISolone acetate (DEPO-MEDROL) injection 80 mg, start pred tomorrow, ultram for breakthru pain, Warning signs and symptoms verbalized and written down in AVS.   Plan: Orders as above. Stretches/exercises, heat, ice, Tylenol, hold NSAIDs while on pred. F/u prn. The patient voiced understanding and agreement to the plan.   [place], DO 11/09/18  11:38 AM

## 2018-12-06 ENCOUNTER — Other Ambulatory Visit: Payer: Self-pay

## 2018-12-06 ENCOUNTER — Ambulatory Visit (INDEPENDENT_AMBULATORY_CARE_PROVIDER_SITE_OTHER): Payer: BLUE CROSS/BLUE SHIELD

## 2018-12-06 DIAGNOSIS — Z23 Encounter for immunization: Secondary | ICD-10-CM | POA: Diagnosis not present

## 2018-12-06 NOTE — Progress Notes (Signed)
Pre visit review using our clinic review tool, if applicable. No additional management support is needed unless otherwise documented below in the visit note.  Patient here today for flu vaccine. 0.51mL flu vaccine given on right deltoid IM. Patient tolerated well.

## 2019-01-13 ENCOUNTER — Other Ambulatory Visit: Payer: Self-pay

## 2019-01-16 ENCOUNTER — Other Ambulatory Visit: Payer: Self-pay

## 2019-01-16 ENCOUNTER — Ambulatory Visit (INDEPENDENT_AMBULATORY_CARE_PROVIDER_SITE_OTHER): Payer: BLUE CROSS/BLUE SHIELD | Admitting: Family Medicine

## 2019-01-16 ENCOUNTER — Encounter: Payer: Self-pay | Admitting: Family Medicine

## 2019-01-16 VITALS — BP 132/78 | HR 81 | Temp 97.0°F | Ht 66.0 in | Wt 190.2 lb

## 2019-01-16 DIAGNOSIS — E785 Hyperlipidemia, unspecified: Secondary | ICD-10-CM | POA: Diagnosis not present

## 2019-01-16 DIAGNOSIS — Z0000 Encounter for general adult medical examination without abnormal findings: Secondary | ICD-10-CM | POA: Diagnosis not present

## 2019-01-16 DIAGNOSIS — F419 Anxiety disorder, unspecified: Secondary | ICD-10-CM | POA: Diagnosis not present

## 2019-01-16 DIAGNOSIS — E039 Hypothyroidism, unspecified: Secondary | ICD-10-CM | POA: Diagnosis not present

## 2019-01-16 LAB — COMPREHENSIVE METABOLIC PANEL
ALT: 21 U/L (ref 0–35)
AST: 18 U/L (ref 0–37)
Albumin: 4.6 g/dL (ref 3.5–5.2)
Alkaline Phosphatase: 77 U/L (ref 39–117)
BUN: 18 mg/dL (ref 6–23)
CO2: 30 mEq/L (ref 19–32)
Calcium: 9.3 mg/dL (ref 8.4–10.5)
Chloride: 102 mEq/L (ref 96–112)
Creatinine, Ser: 0.8 mg/dL (ref 0.40–1.20)
GFR: 72.15 mL/min (ref >60.00)
Glucose, Bld: 98 mg/dL (ref 70–99)
Potassium: 4.4 mEq/L (ref 3.5–5.1)
Sodium: 139 mEq/L (ref 135–145)
Total Bilirubin: 0.4 mg/dL (ref 0.2–1.2)
Total Protein: 6.9 g/dL (ref 6.0–8.3)

## 2019-01-16 LAB — LIPID PANEL
Cholesterol: 190 mg/dL (ref 0–200)
HDL: 55.2 mg/dL (ref >39.00)
LDL Cholesterol: 105 mg/dL — ABNORMAL HIGH (ref 0–99)
NonHDL: 134.32
Total CHOL/HDL Ratio: 3
Triglycerides: 148 mg/dL (ref 0.0–149.0)
VLDL: 29.6 mg/dL (ref 0.0–40.0)

## 2019-01-16 LAB — CBC
HCT: 44.1 % (ref 36.0–46.0)
Hemoglobin: 14.6 g/dL (ref 12.0–15.0)
MCHC: 33 g/dL (ref 30.0–36.0)
MCV: 87.3 fl (ref 78.0–100.0)
Platelets: 251 10*3/uL (ref 150.0–400.0)
RBC: 5.06 Mil/uL (ref 3.87–5.11)
RDW: 13.5 % (ref 11.5–15.5)
WBC: 6.4 10*3/uL (ref 4.0–10.5)

## 2019-01-16 MED ORDER — ATORVASTATIN CALCIUM 10 MG PO TABS: 10.0000 mg | ORAL_TABLET | Freq: Every day | ORAL | 3 refills | Status: DC

## 2019-01-16 MED ORDER — ALPRAZOLAM 0.5 MG PO TABS: 0.5000 mg | ORAL_TABLET | Freq: Every day | ORAL | 5 refills | Status: DC | PRN

## 2019-01-16 MED ORDER — VENLAFAXINE HCL ER 150 MG PO CP24: 150.0000 mg | ORAL_CAPSULE | Freq: Every day | ORAL | 2 refills | Status: DC

## 2019-01-16 MED ORDER — LEVOTHYROXINE SODIUM 125 MCG PO TABS: 125.0000 ug | ORAL_TABLET | Freq: Every day | ORAL | 2 refills | Status: DC

## 2019-01-16 NOTE — Progress Notes (Signed)
Chief Complaint  Patient presents with  . Annual Exam     Well Woman Elaine Buck is here for a complete physical.   Her last physical was >1 year ago.  Current diet: in general, diet could be better. Current exercise: aquatic exercise. Weight is increasing a little over pandemic and she denies daytime fatigue. No LMP recorded. Patient is postmenopausal. Seatbelt? Yes  Health Maintenance Pap/HPV- Yes Mammogram- Yes Colon cancer screening-Yes Shingrix- Yes Tetanus- Yes Hep C screening- Yes HIV screening- Yes  Past Medical History:  Diagnosis Date  . Anxiety   . Cancer ([place])    breast (Left)  . History of lobular carcinoma in situ (LCIS) of  left breast    s/p lumpectomy in 2005, Tamoxifen/Evista therapy for 5 years  . Hyperlipidemia   . Thyroid disease      Past Surgical History:  Procedure Laterality Date  . BREAST BIOPSY    . BREAST LUMPECTOMY Left 05/22/2003   LCIS Cells found in breast tissue    Medications  Current Outpatient Medications on File Prior to Visit  Medication Sig Dispense Refill  . ALPRAZolam (XANAX) 0.5 MG tablet Take 1 tablet (0.5 mg total) by mouth daily as needed for anxiety. 30 tablet 5  . atorvastatin (LIPITOR) 10 MG tablet Take 1 tablet (10 mg total) by mouth daily. 90 tablet 3  . levothyroxine (SYNTHROID) 125 MCG tablet Take 1 tablet (125 mcg total) by mouth daily. 90 tablet 2  . venlafaxine XR (EFFEXOR-XR) 150 MG 24 hr capsule Take 1 capsule (150 mg total) by mouth daily with breakfast. 90 capsule 2    Allergies No Known Allergies  Review of Systems: Constitutional:  no unexpected weight changes Eye:  no recent significant change in vision Ear/Nose/Mouth/Throat:  Ears: Hearing is poor, no recent/significant change Nose/Mouth/Throat:  no complaints of nasal congestion, no sore throat Cardiovascular: no chest pain Respiratory:  no shortness of breath Gastrointestinal:  no abdominal pain, no change in bowel habits GU:  Female:  negative for dysuria or pelvic pain Musculoskeletal/Extremities: +chronic foot pain; otherwise no pain of the joints Integumentary (Skin/Breast):  no abnormal skin lesions reported Neurologic:  no headaches Endocrine:  denies fatigue Hematologic/Lymphatic:  No areas of easy bleeding  Exam BP 132/78 (BP Location: Left Arm, Patient Position: Sitting, Cuff Size: Normal)   Pulse 81   Temp (!) 97 F (36.1 C) (Temporal)   Ht 5\' 6"  (1.676 m)   Wt 190 lb 4 oz (86.3 kg)   SpO2 95%   BMI 30.71 kg/m  General:  well developed, well nourished, in no apparent distress Skin:  no significant moles, warts, or growths Head:  no masses, lesions, or tenderness Eyes:  pupils equal and round, sclera anicteric without injection Ears:  canals without lesions, TMs shiny without retraction, no obvious effusion, no erythema Nose:  nares patent, septum midline, mucosa normal, and no drainage or sinus tenderness Throat/Pharynx:  lips and gingiva without lesion; tongue and uvula midline; non-inflamed pharynx; no exudates or postnasal drainage Neck: neck supple without adenopathy, thyromegaly, or masses Lungs:  clear to auscultation, breath sounds equal bilaterally, no respiratory distress Cardio:  regular rate and rhythm, no LE edema Abdomen:  abdomen soft, nontender; bowel sounds normal; no masses or organomegaly Genital: Defer to GYN Musculoskeletal:  symmetrical muscle groups noted without atrophy or deformity Extremities:  no clubbing, cyanosis, or edema, no deformities, no skin discoloration Neuro:  gait normal; deep tendon reflexes normal and symmetric Psych: well oriented with normal range of  affect and appropriate judgment/insight  Assessment and Plan  Well adult exam - Plan: CBC, Comprehensive metabolic panel, Lipid panel  Hypothyroidism, unspecified type - Plan: levothyroxine (SYNTHROID) 125 MCG tablet  Anxiety - Plan: venlafaxine XR (EFFEXOR-XR) 150 MG 24 hr capsule, ALPRAZolam (XANAX) 0.5 MG  tablet  Hyperlipidemia, unspecified hyperlipidemia type - Plan: atorvastatin (LIPITOR) 10 MG tablet   Well 63 y.o. female. Counseled on diet and exercise. Other orders as above. Follow up in 6 mo or prn. The patient voiced understanding and agreement to the plan.  [place], DO 01/16/19 9:20 AM

## 2019-01-16 NOTE — Patient Instructions (Signed)
Give [place] 2-3 business days to get the results of your labs back.   Keep the diet clean and stay active.  Consider yoga or tai chi.   Let us know if you need anything.

## 2019-01-19 ENCOUNTER — Ambulatory Visit: Payer: BLUE CROSS/BLUE SHIELD | Admitting: Podiatry

## 2019-01-25 ENCOUNTER — Other Ambulatory Visit: Payer: Self-pay | Admitting: Family Medicine

## 2019-01-25 ENCOUNTER — Encounter: Payer: Self-pay | Admitting: Family Medicine

## 2019-01-25 DIAGNOSIS — H9193 Unspecified hearing loss, bilateral: Secondary | ICD-10-CM

## 2019-01-25 NOTE — Progress Notes (Signed)
a 

## 2019-01-25 NOTE — Progress Notes (Signed)
f °

## 2019-01-26 ENCOUNTER — Ambulatory Visit (INDEPENDENT_AMBULATORY_CARE_PROVIDER_SITE_OTHER): Payer: BLUE CROSS/BLUE SHIELD | Admitting: Podiatry

## 2019-01-26 ENCOUNTER — Ambulatory Visit (INDEPENDENT_AMBULATORY_CARE_PROVIDER_SITE_OTHER): Payer: BLUE CROSS/BLUE SHIELD

## 2019-01-26 ENCOUNTER — Other Ambulatory Visit: Payer: Self-pay

## 2019-01-26 DIAGNOSIS — G8929 Other chronic pain: Secondary | ICD-10-CM

## 2019-01-26 DIAGNOSIS — M21962 Unspecified acquired deformity of left lower leg: Secondary | ICD-10-CM

## 2019-01-26 DIAGNOSIS — M792 Neuralgia and neuritis, unspecified: Secondary | ICD-10-CM | POA: Diagnosis not present

## 2019-01-26 DIAGNOSIS — M79672 Pain in left foot: Secondary | ICD-10-CM

## 2019-01-26 DIAGNOSIS — M205X2 Other deformities of toe(s) (acquired), left foot: Secondary | ICD-10-CM | POA: Diagnosis not present

## 2019-01-26 DIAGNOSIS — M779 Enthesopathy, unspecified: Secondary | ICD-10-CM

## 2019-01-26 MED ORDER — PREGABALIN 75 MG PO CAPS: 75.0000 mg | ORAL_CAPSULE | Freq: Two times a day (BID) | ORAL | 1 refills | Status: DC

## 2019-01-27 ENCOUNTER — Other Ambulatory Visit: Payer: Self-pay | Admitting: Podiatry

## 2019-01-27 DIAGNOSIS — M779 Enthesopathy, unspecified: Secondary | ICD-10-CM

## 2019-01-30 ENCOUNTER — Encounter: Payer: Self-pay | Admitting: Podiatry

## 2019-01-30 NOTE — Progress Notes (Signed)
Subjective:   Patient ID: Elaine Buck, female   DOB: 63 y.o.   MRN: [id number]   HPI 63 year old presents the office today for concerns of pain to her left foot.  She states that in January 2020 she had [place] bunionectomy on her left foot to shorten the bone because of hallux rigidus.  She later had a second surgery remove the wire.  She states that since that surgery she has had ongoing pain and worsening pain to the left foot she is having numbness to her foot as well as sharp pains.  She points to submetatarsal 2 where she gets majority of tenderness.  She states that she has neuropathy pain on a daily basis and she wants to have a second opinion and she presents today for this.   Review of Systems  All other systems reviewed and are negative.  Past Medical History:  Diagnosis Date  . Anxiety   . Cancer ([place])    breast (Left)  . History of lobular carcinoma in situ (LCIS) of  left breast    s/p lumpectomy in 2005, Tamoxifen/Evista therapy for 5 years  . Hyperlipidemia   . Thyroid disease     Past Surgical History:  Procedure Laterality Date  . BREAST BIOPSY    . BREAST LUMPECTOMY Left 05/22/2003   LCIS Cells found in breast tissue     Current Outpatient Medications:  .  ALPRAZolam (XANAX) 0.5 MG tablet, Take 1 tablet (0.5 mg total) by mouth daily as needed for anxiety., Disp: 30 tablet, Rfl: 5 .  atorvastatin (LIPITOR) 10 MG tablet, Take 1 tablet (10 mg total) by mouth daily., Disp: 90 tablet, Rfl: 3 .  levothyroxine (SYNTHROID) 125 MCG tablet, Take 1 tablet (125 mcg total) by mouth daily., Disp: 90 tablet, Rfl: 2 .  venlafaxine XR (EFFEXOR-XR) 150 MG 24 hr capsule, Take 1 capsule (150 mg total) by mouth daily with breakfast., Disp: 90 capsule, Rfl: 2 .  pregabalin (LYRICA) 75 MG capsule, Take 1 capsule (75 mg total) by mouth 2 (two) times daily., Disp: 60 capsule, Rfl: 1  No Known Allergies        Objective:  Physical Exam  General: AAO x3, NAD  Dermatological:  Scar from prior surgery is well-healed.  Vascular: Dorsalis Pedis artery and Posterior Tibial artery pedal pulses are 2/4 bilateral with immedate capillary fill time. Pedal hair growth present. No varicosities and no lower extremity edema present bilateral. There is no pain with calf compression, swelling, warmth, erythema.   Neruologic: Sensation decreased in the forefoot on the left side.  She describes burning, sharp pains to her foot.  Musculoskeletal: There is decreased range of motion of first MPJ is crepitation with MPJ range of motion.  There is tenderness to submetatarsal 2 on the left foot as well.  No areas of pinpoint tenderness.  Muscular strength 5/5 in all groups tested bilateral.  Gait: Unassisted, Nonantalgic.        Assessment:   63 year old female left foot hallux limitus, second MPJ capsulitis; neuritis     Plan:  -Treatment options discussed including all alternatives, risks, and complications -Etiology of symptoms were discussed -X-rays were obtained and reviewed with the patient. There is a short first ray and elongated second ray.  Arthritic is present first MPJ. -We discussed the conservative as well as surgical treatment options.  Ultimately I do think she is in need of another surgery.  We discussed first IPJ arthrodesis versus implant with second tarsal osteotomy.  However given  the amount of nerve pain she is having we will go and start Lyrica.  She patient on gabapentin for pain relief.  Discussed side effects of the medication.  Also order a nerve conduction test. -She brought in an old pair of orthotics.  Risks, modify them to make an full-length offload second PIPJ for now.  [name]

## 2019-01-31 ENCOUNTER — Other Ambulatory Visit: Payer: Self-pay

## 2019-01-31 ENCOUNTER — Telehealth: Payer: Self-pay | Admitting: *Deleted

## 2019-01-31 ENCOUNTER — Encounter: Payer: Self-pay | Admitting: Neurology

## 2019-01-31 DIAGNOSIS — M792 Neuralgia and neuritis, unspecified: Secondary | ICD-10-CM

## 2019-01-31 DIAGNOSIS — R202 Paresthesia of skin: Secondary | ICD-10-CM

## 2019-01-31 NOTE — Telephone Encounter (Signed)
-----   Message from Trula Slade, DPM sent at 01/30/2019 11:23 AM EST ----- Can you please order a NCV due to numbness? Thanks.

## 2019-01-31 NOTE — Telephone Encounter (Signed)
Faxed orders, demographics and clinicals to [facility].

## 2019-02-05 ENCOUNTER — Encounter: Payer: Self-pay | Admitting: Podiatry

## 2019-02-06 MED ORDER — PREGABALIN 150 MG PO CAPS: 150.0000 mg | ORAL_CAPSULE | Freq: Two times a day (BID) | ORAL | 5 refills | Status: DC

## 2019-02-06 NOTE — Telephone Encounter (Signed)
Sent MyChart Message to pt informing of the Lyrica 150mg  #60 one capsule bid and to be aware of the change of dosage. Called orders to pt's pharmacy-[facility].

## 2019-02-20 NOTE — Telephone Encounter (Signed)
Pt called to see if her records have been sent

## 2019-02-22 ENCOUNTER — Telehealth: Payer: Self-pay | Admitting: Podiatry

## 2019-02-22 NOTE — Telephone Encounter (Signed)
Spoke to Santiago Glad @ [place] location trying to locate pts orthotic. She told me to have pt come pick it up by 200pm today at [address].  I have notified patient and she said if she could not she would send her husband to pick the orthotic up.Elaine Buck

## 2019-02-22 NOTE — Telephone Encounter (Signed)
Can you let her know that Lattie Haw put it in the drawer? Not sure if you spoke to [place] about it.

## 2019-02-27 ENCOUNTER — Other Ambulatory Visit: Payer: Self-pay

## 2019-02-28 ENCOUNTER — Ambulatory Visit (INDEPENDENT_AMBULATORY_CARE_PROVIDER_SITE_OTHER): Payer: BLUE CROSS/BLUE SHIELD | Admitting: Neurology

## 2019-02-28 ENCOUNTER — Other Ambulatory Visit: Payer: Self-pay

## 2019-02-28 DIAGNOSIS — M79672 Pain in left foot: Secondary | ICD-10-CM

## 2019-02-28 DIAGNOSIS — M792 Neuralgia and neuritis, unspecified: Secondary | ICD-10-CM

## 2019-02-28 NOTE — Procedures (Signed)
[facility] Neurology  [address]  [place], [place] [address] Tel: [phone] Fax:  [phone] Test Date:  02/28/2019  Patient: Elaine Buck DOB: Jan 09, 1956 Physician: Narda Amber, DO  Sex: Female Height: 5\' 6"  Ref Phys: Celesta Gentile, DPM  ID#: [id number] Temp: 32.0C Technician:    Patient Complaints: This is a 63 year old female with history of left foot surgery presenting for evaluation of ongoing pain and numbness of the foot.  NCV & EMG Findings: Electrodiagnostic testing of the right lower extremity and additional studies of the left shows: 1. Bilateral sural and superficial peroneal sensory responses are within normal limits. 2. Bilateral peroneal and tibial motor responses are within normal limits. 3. Bilateral tibial H reflex studies are within normal limits. 4. There is no evidence of active or chronic motor axonal changes affecting any of the tested muscles.  Motor unit configuration and recruitment pattern is within normal limits.  Impression: This is a normal study of the lower extremities.  In particular, there is no evidence of a sensorimotor polyneuropathy or lumbosacral radiculopathy.   ___________________________ Narda Amber, DO    Nerve Conduction Studies Anti Sensory Summary Table   Site NR Peak (ms) Norm Peak (ms) P-T Amp (V) Norm P-T Amp  Left Sup Peroneal Anti Sensory (Ant Lat Mall)  32C  12 cm    3.0 <4.6 6.0 >3  Right Sup Peroneal Anti Sensory (Ant Lat Mall)  32C  12 cm    3.3 <4.6 7.5 >3  Left Sural Anti Sensory (Lat Mall)  32C  Calf    3.9 <4.6 12.0 >3  Right Sural Anti Sensory (Lat Mall)  32C  Calf    3.5 <4.6 13.7 >3   Motor Summary Table   Site NR Onset (ms) Norm Onset (ms) O-P Amp (mV) Norm O-P Amp Site1 Site2 Delta-0 (ms) Dist (cm) Vel (m/s) Norm Vel (m/s)  Left Peroneal Motor (Ext Dig Brev)  32C  Ankle    4.4 <6.0 4.0 >2.5 B Fib Ankle 8.3 36.0 43 >40  B Fib    12.7  4.0  Poplt B Fib 1.8 8.0 44 >40   Poplt    14.5  4.1         Right Peroneal Motor (Ext Dig Brev)  32C  Ankle    3.8 <6.0 4.4 >2.5 B Fib Ankle 8.2 38.0 46 >40  B Fib    12.0  3.8  Poplt B Fib 1.4 7.0 50 >40  Poplt    13.4  3.8         Left Tibial Motor (Abd Hall Brev)  32C  Ankle    4.3 <6.0 5.6 >4 Knee Ankle 9.7 40.0 41 >40  Knee    14.0  3.2         Right Tibial Motor (Abd Hall Brev)  32C  Ankle    4.1 <6.0 7.2 >4 Knee Ankle 9.6 41.0 43 >40  Knee    13.7  4.3          H Reflex Studies   NR H-Lat (ms) Lat Norm (ms) L-R H-Lat (ms)  Left Tibial (Gastroc)  32C     34.42 <35 0.95  Right Tibial (Gastroc)  32C     33.47 <35 0.95   EMG   Side Muscle Ins Act Fibs Psw Fasc Number Recrt Dur Dur. Amp Amp. Poly Poly. Comment  Left AntTibialis Nml Nml Nml Nml Nml Nml Nml Nml Nml Nml Nml Nml N/A  Left Gastroc Nml  Nml Nml Nml Nml Nml Nml Nml Nml Nml Nml Nml N/A  Left Flex Dig Long Nml Nml Nml Nml Nml Nml Nml Nml Nml Nml Nml Nml N/A  Left RectFemoris Nml Nml Nml Nml Nml Nml Nml Nml Nml Nml Nml Nml N/A  Left GluteusMed Nml Nml Nml Nml Nml Nml Nml Nml Nml Nml Nml Nml N/A  Right AntTibialis Nml Nml Nml Nml Nml Nml Nml Nml Nml Nml Nml Nml N/A  Right Gastroc Nml Nml Nml Nml Nml Nml Nml Nml Nml Nml Nml Nml N/A  Right Flex Dig Long Nml Nml Nml Nml Nml Nml Nml Nml Nml Nml Nml Nml N/A  Right RectFemoris Nml Nml Nml Nml Nml Nml Nml Nml Nml Nml Nml Nml N/A  Right GluteusMed Nml Nml Nml Nml Nml Nml Nml Nml Nml Nml Nml Nml N/A      Waveforms:

## 2019-03-02 ENCOUNTER — Telehealth: Payer: Self-pay | Admitting: *Deleted

## 2019-03-02 NOTE — Telephone Encounter (Signed)
Pt called for results.

## 2019-03-02 NOTE — Telephone Encounter (Signed)
Left message informing pt I felt she should be informed of her results so she could schedule for a surgery consultation, and I informed of Dr. Leigh Aurora review of results and recommendation to come in to discuss surgery.

## 2019-03-02 NOTE — Telephone Encounter (Signed)
-----   Message from Trula Slade, DPM sent at 03/02/2019  7:13 AM EST ----- Val- please let her know that the NCV was normal. Please have her come in for a surgery consult. Thanks.

## 2019-03-02 NOTE — Telephone Encounter (Signed)
Left message for pt to call for results  

## 2019-03-06 ENCOUNTER — Encounter: Payer: Self-pay | Admitting: Podiatry

## 2019-03-20 ENCOUNTER — Ambulatory Visit: Payer: BLUE CROSS/BLUE SHIELD | Attending: Internal Medicine

## 2019-03-20 DIAGNOSIS — Z20822 Contact with and (suspected) exposure to covid-19: Secondary | ICD-10-CM

## 2019-03-22 LAB — NOVEL CORONAVIRUS, NAA: SARS-CoV-2, NAA: NOT DETECTED

## 2019-03-28 ENCOUNTER — Ambulatory Visit: Payer: BLUE CROSS/BLUE SHIELD | Admitting: Podiatry

## 2019-03-30 ENCOUNTER — Ambulatory Visit: Payer: BLUE CROSS/BLUE SHIELD | Admitting: Podiatry

## 2019-03-30 ENCOUNTER — Ambulatory Visit: Payer: BLUE CROSS/BLUE SHIELD | Attending: Internal Medicine

## 2019-03-30 DIAGNOSIS — Z20822 Contact with and (suspected) exposure to covid-19: Secondary | ICD-10-CM

## 2019-04-01 LAB — NOVEL CORONAVIRUS, NAA: SARS-CoV-2, NAA: NOT DETECTED

## 2019-04-10 ENCOUNTER — Other Ambulatory Visit: Payer: Self-pay

## 2019-04-10 ENCOUNTER — Ambulatory Visit (INDEPENDENT_AMBULATORY_CARE_PROVIDER_SITE_OTHER): Payer: BLUE CROSS/BLUE SHIELD | Admitting: Podiatry

## 2019-04-10 DIAGNOSIS — M2042 Other hammer toe(s) (acquired), left foot: Secondary | ICD-10-CM | POA: Diagnosis not present

## 2019-04-10 DIAGNOSIS — M545 Low back pain, unspecified: Secondary | ICD-10-CM | POA: Insufficient documentation

## 2019-04-10 DIAGNOSIS — M205X2 Other deformities of toe(s) (acquired), left foot: Secondary | ICD-10-CM

## 2019-04-10 DIAGNOSIS — M21962 Unspecified acquired deformity of left lower leg: Secondary | ICD-10-CM

## 2019-04-10 DIAGNOSIS — M25519 Pain in unspecified shoulder: Secondary | ICD-10-CM | POA: Insufficient documentation

## 2019-04-10 DIAGNOSIS — M48061 Spinal stenosis, lumbar region without neurogenic claudication: Secondary | ICD-10-CM | POA: Insufficient documentation

## 2019-04-10 DIAGNOSIS — R200 Anesthesia of skin: Secondary | ICD-10-CM | POA: Insufficient documentation

## 2019-04-10 NOTE — Patient Instructions (Signed)
Pre-Operative Instructions  Congratulations, you have decided to take an important step towards improving your quality of life.  You can be assured that the doctors and staff at [facility] will be with you every step of the way.  Here are some important things you should know:  1. Plan to be at the surgery center/hospital at least 1 (one) hour prior to your scheduled time, unless otherwise directed by the surgical center/hospital staff.  You must have a responsible adult accompany you, remain during the surgery and drive you home.  Make sure you have directions to the surgical center/hospital to ensure you arrive on time. 2. If you are having surgery at [facility] or [facility], you will need a copy of your medical history and physical form from your family physician within one month prior to the date of surgery. We will give you a form for your primary physician to complete.  3. We make every effort to accommodate the date you request for surgery.  However, there are times where surgery dates or times have to be moved.  We will contact you as soon as possible if a change in schedule is required.   4. No aspirin/ibuprofen for one week before surgery.  If you are on aspirin, any non-steroidal anti-inflammatory medications (Mobic, Aleve, Ibuprofen) should not be taken seven (7) days prior to your surgery.  You make take Tylenol for pain prior to surgery.  5. Medications - If you are taking daily heart and blood pressure medications, seizure, reflux, allergy, asthma, anxiety, pain or diabetes medications, make sure you notify the surgery center/hospital before the day of surgery so they can tell you which medications you should take or avoid the day of surgery. 6. No food or drink after midnight the night before surgery unless directed otherwise by surgical center/hospital staff. 7. No alcoholic beverages 24-hours prior to surgery.  No smoking 24-hours prior or 24-hours after  surgery. 8. Wear loose pants or shorts. They should be loose enough to fit over bandages, boots, and casts. 9. Don't wear slip-on shoes. Sneakers are preferred. 10. Bring your boot with you to the surgery center/hospital.  Also bring crutches or a walker if your physician has prescribed it for you.  If you do not have this equipment, it will be provided for you after surgery. 11. If you have not been contacted by the surgery center/hospital by the day before your surgery, call to confirm the date and time of your surgery. 12. Leave-time from work may vary depending on the type of surgery you have.  Appropriate arrangements should be made prior to surgery with your employer. 13. Prescriptions will be provided immediately following surgery by your doctor.  Fill these as soon as possible after surgery and take the medication as directed. Pain medications will not be refilled on weekends and must be approved by the doctor. 14. Remove nail polish on the operative foot and avoid getting pedicures prior to surgery. 15. Wash the night before surgery.  The night before surgery wash the foot and leg well with water and the antibacterial soap provided. Be sure to pay special attention to beneath the toenails and in between the toes.  Wash for at least three (3) minutes. Rinse thoroughly with water and dry well with a towel.  Perform this wash unless told not to do so by your physician.  Enclosed: 1 Ice pack (please put in freezer the night before surgery)   1 Hibiclens skin cleaner     Pre-op instructions  If you have any questions regarding the instructions, please do not hesitate to call our office.  [place]: [address], [place], [place] [address] -- [phone]  [place]: [address] [place], [place] [address] -- [phone]  [place]: [address], [place], [place] [address] -- [phone]   Website: [email or web address] 

## 2019-04-11 DIAGNOSIS — M205X2 Other deformities of toe(s) (acquired), left foot: Secondary | ICD-10-CM | POA: Insufficient documentation

## 2019-04-11 DIAGNOSIS — M2042 Other hammer toe(s) (acquired), left foot: Secondary | ICD-10-CM | POA: Insufficient documentation

## 2019-04-11 NOTE — Progress Notes (Signed)
Subjective: 64 year old female presents the office today with her husband for surgical consultation given chronic left foot pain.  She previous underwent first metatarsal shortening osteotomy and subsequently removal of hardware.  She states that she is continuing having daily discomfort to the foot.  She points more on the big toe joint but also submetatarsal where she gets the majority discomfort.  We appear to tried nerve medications but this was not helpful.  Also given her nerve issues I ordered a nerve conduction test to make sure there is no further issues and this was negative.  At this point she has tried shoe modifications, offloading, modification to her inserts any improvement she wants to proceed with surgical intervention.  Denies any systemic complaints such as fevers, chills, nausea, vomiting. No acute changes since last appointment, and no other complaints at this time.   Objective: AAO x3, NAD DP/PT pulses palpable bilaterally, CRT less than 3 seconds There is decreased range of motion and pain with range of motion of the first MPJ in the left foot.  There is tenderness submetatarsal 2 and there is a hammertoe contracture present of the second digit.  She has tenderness to the second toe as well.  There is no other areas of discomfort identified at this time. No open lesions or pre-ulcerative lesions.  No pain with calf compression, swelling, warmth, erythema  Assessment: Left foot hallux rigidus, elongated second metatarsal resultant capsulitis, hammertoe deformity.  Plan: -All treatment options discussed with the patient including all alternatives, risks, complications.  -I again reviewed the x-rays with her and I also went through the previous x-rays that she had prior to her original surgery.  At this point I do think that another surgery is warranted given her continued pain.  For the first MPJ we discussed implant arthroplasty versus arthrodesis.  Also discussed submetatarsal  shortening osteotomy with hammertoe repair of the second digit.  After discussion she wants to proceed with first MPJ implant arthroplasty with second metatarsal osteotomy, hammertoe repair. -The incision placement as well as the postoperative course was discussed with the patient. I discussed risks of the surgery which include, but not limited to, infection, bleeding, pain, swelling, need for further surgery, delayed or nonhealing, painful or ugly scar, numbness or sensation changes, over/under correction, recurrence, transfer lesions, further deformity, hardware failure, DVT/PE, loss of toe/foot. Patient understands these risks and wishes to proceed with surgery. The surgical consent was reviewed with the patient all 3 pages were signed. No promises or guarantees were given to the outcome of the procedure. All questions were answered to the best of my ability. Before the surgery the patient was encouraged to call the office if there is any further questions. The surgery will be performed at the [facility] on an outpatient basis. -Patient encouraged to call the office with any questions, concerns, change in symptoms.   [name]

## 2019-04-21 ENCOUNTER — Telehealth: Payer: Self-pay | Admitting: Podiatry

## 2019-04-21 NOTE — Telephone Encounter (Signed)
DOS: 05/03/2019  SURGICAL PROCEDURES: Vilinda Blanks Implant [phone]), Metatarsal Osteotomy 2nd FUQX(47583), Hammertoe Repair 2nd [phone]), and Injection of Plantar Fibroma Left(20550).  BCBS Effective 03/23/2018 -  Deductible is $1,500 with $155.98 met and $1,344.02 is remaining. Out of Pocket is $4,500 with $155.98 met and $4,344.02 is remaining. CoInsurance is 85% / 15%.  Per Morey Hummingbird L no prior authorization or referrals are required. Call ref# [id number].

## 2019-04-24 HISTORY — PX: FOOT SURGERY: SHX648

## 2019-05-03 ENCOUNTER — Other Ambulatory Visit: Payer: Self-pay | Admitting: Podiatry

## 2019-05-03 ENCOUNTER — Encounter: Payer: Self-pay | Admitting: Podiatry

## 2019-05-03 DIAGNOSIS — M2022 Hallux rigidus, left foot: Secondary | ICD-10-CM

## 2019-05-03 DIAGNOSIS — M2042 Other hammer toe(s) (acquired), left foot: Secondary | ICD-10-CM

## 2019-05-03 DIAGNOSIS — M21542 Acquired clubfoot, left foot: Secondary | ICD-10-CM

## 2019-05-03 MED ORDER — CEPHALEXIN 500 MG PO CAPS: 500.0000 mg | ORAL_CAPSULE | Freq: Three times a day (TID) | ORAL | 0 refills | Status: DC

## 2019-05-03 MED ORDER — PROMETHAZINE HCL 25 MG PO TABS: 25.0000 mg | ORAL_TABLET | Freq: Three times a day (TID) | ORAL | 0 refills | Status: DC | PRN

## 2019-05-03 MED ORDER — OXYCODONE-ACETAMINOPHEN 5-325 MG PO TABS: 1.0000 | ORAL_TABLET | Freq: Four times a day (QID) | ORAL | 0 refills | Status: DC | PRN

## 2019-05-03 NOTE — Progress Notes (Signed)
Post-op medications sent to the pharmacy.  

## 2019-05-04 ENCOUNTER — Telehealth: Payer: Self-pay | Admitting: Podiatry

## 2019-05-04 NOTE — Telephone Encounter (Signed)
I called patient to see how she was doing after surgery. Left VM to call back and left the office number and my cell phone number.

## 2019-05-05 ENCOUNTER — Telehealth: Payer: Self-pay | Admitting: *Deleted

## 2019-05-05 NOTE — Telephone Encounter (Signed)
Called and left a message for the patient to call me back-I was calling to see how the patient was doing from having surgery on Wednesday with Dr Jacqualyn Posey. Elaine Buck

## 2019-05-08 ENCOUNTER — Ambulatory Visit (INDEPENDENT_AMBULATORY_CARE_PROVIDER_SITE_OTHER): Payer: BLUE CROSS/BLUE SHIELD

## 2019-05-08 ENCOUNTER — Other Ambulatory Visit: Payer: Self-pay

## 2019-05-08 ENCOUNTER — Ambulatory Visit (INDEPENDENT_AMBULATORY_CARE_PROVIDER_SITE_OTHER): Payer: BLUE CROSS/BLUE SHIELD | Admitting: Podiatry

## 2019-05-08 DIAGNOSIS — M2042 Other hammer toe(s) (acquired), left foot: Secondary | ICD-10-CM

## 2019-05-08 DIAGNOSIS — Z9889 Other specified postprocedural states: Secondary | ICD-10-CM

## 2019-05-08 DIAGNOSIS — M205X2 Other deformities of toe(s) (acquired), left foot: Secondary | ICD-10-CM

## 2019-05-08 DIAGNOSIS — M21962 Unspecified acquired deformity of left lower leg: Secondary | ICD-10-CM

## 2019-05-08 MED ORDER — IBUPROFEN 800 MG PO TABS: 800.0000 mg | ORAL_TABLET | Freq: Three times a day (TID) | ORAL | 0 refills | Status: DC | PRN

## 2019-05-08 MED ORDER — OXYCODONE-ACETAMINOPHEN 5-325 MG PO TABS: 1.0000 | ORAL_TABLET | Freq: Four times a day (QID) | ORAL | 0 refills | Status: AC | PRN

## 2019-05-08 NOTE — Progress Notes (Signed)
Subjective: Elaine Buck is a 64 y.o. is seen today in office s/p left 1st MTPJ arthroplasty with implant, 2nd metatarsal osteotomy with hammertoe repair preformed on 05/03/2019.  She is describing pain and she is taking 1 pain pill every 6 hours and 2 at nighttime.  She has intermittent stinging sensation to her foot.  She is on antibiotics.  She has been wearing the cam boot.  Denies any systemic complaints such as fevers, chills, nausea, vomiting. No calf pain, chest pain, shortness of breath.   Objective: General: No acute distress, AAOx3  DP/PT pulses palpable 2/4, CRT < 3 sec to all digits.  Protective sensation intact. Motor function intact.  LEFT foot: Incision is well coapted without any evidence of dehiscence with sutures intact. K-wire intact to the second toe. There is faint rim of erythema on the incision but this is likely more from inflammation as opposed to infection.  There is no ascending cellulitis, fluctuance, crepitus, malodor, drainage/purulence. There is ,o;d edema around the surgical site. There is mild pain along the surgical site.  Mild discomfort MPJ range of motion. No other areas of tenderness to bilateral lower extremities.  No other open lesions or pre-ulcerative lesions.  No pain with calf compression, swelling, warmth, erythema.   Assessment and Plan:  Status post left foot surgery, doing well with no complications   -Treatment options discussed including all alternatives, risks, and complications -X-rays obtained and reviewed.  Hardware intact.  No evidence of acute fracture. -Small amount of antibiotic ointment applied to the incisions followed by dry sterile dressing.  Keep the dressing clean, dry, intact. -Remain in cam boot and weightbearing as tolerated but limited. -Ice/elevation -Pain medication as needed-refill pain medicine also prescribed ibuprofen in her milligrams as needed. -Monitor for any clinical signs or symptoms of infection and DVT/PE and  directed to call the office immediately should any occur or go to the ER. -Follow-up as scheduled or sooner if any problems arise. In the meantime, encouraged to call the office with any questions, concerns, change in symptoms.   Celesta Gentile, DPM

## 2019-05-18 ENCOUNTER — Encounter: Payer: Self-pay | Admitting: Podiatry

## 2019-05-18 ENCOUNTER — Other Ambulatory Visit: Payer: Self-pay

## 2019-05-18 ENCOUNTER — Ambulatory Visit (INDEPENDENT_AMBULATORY_CARE_PROVIDER_SITE_OTHER): Payer: BLUE CROSS/BLUE SHIELD | Admitting: Podiatry

## 2019-05-18 VITALS — BP 130/76 | HR 94 | Temp 97.9°F | Resp 16

## 2019-05-18 DIAGNOSIS — M205X2 Other deformities of toe(s) (acquired), left foot: Secondary | ICD-10-CM

## 2019-05-18 DIAGNOSIS — Z9889 Other specified postprocedural states: Secondary | ICD-10-CM

## 2019-05-18 DIAGNOSIS — M21962 Unspecified acquired deformity of left lower leg: Secondary | ICD-10-CM

## 2019-05-19 DIAGNOSIS — M21962 Unspecified acquired deformity of left lower leg: Secondary | ICD-10-CM | POA: Insufficient documentation

## 2019-05-19 NOTE — Progress Notes (Signed)
Subjective: Elaine Buck is a 64 y.o. is seen today in office s/p left 1st MTPJ arthroplasty with implant, 2nd metatarsal osteotomy with hammertoe repair preformed on 05/03/2019.  Overall she states that she is feeling better and her pain is much better controlled.  She has been working on range of motion to her big toe and has been feeling better.  She denies any recent injury or trauma otherwise. Denies any systemic complaints such as fevers, chills, nausea, vomiting. No calf pain, chest pain, shortness of breath.   Objective: General: No acute distress, AAOx3  DP/PT pulses palpable 2/4, CRT < 3 sec to all digits.  Protective sensation intact. Motor function intact.  LEFT foot: Incision is well coapted without any evidence of dehiscence with sutures intact. K-wire intact to the second toe.  The faint rim of erythema she had last appointment has resolved.  There is no drainage or pus, no incision there is no ascending cellulitis.  There is mild edema.  There is no obvious signs of infection.  There is improved range of motion of first MPJ without significant discomfort. No other areas of tenderness to bilateral lower extremities.  No other open lesions or pre-ulcerative lesions.  No pain with calf compression, swelling, warmth, erythema.   Assessment and Plan:  Status post left foot surgery, doing well with no complications   -Treatment options discussed including all alternatives, risks, and complications -I removed a few sutures today but any complications.  There is still some mild motion across the incision site left the remainder intact.  Antibiotic ointment was applied followed by dry sterile dressing. -Continue a cam boot -Continue range of motion exercises for the first MPJ. -Continue to ice elevate -Monitor for any clinical signs or symptoms of infection and directed to call the office immediately should any occur or go to the ER.  Follow-up next week for likely suture removal or  sooner if any issues are to arise.  [name]

## 2019-05-26 ENCOUNTER — Encounter: Payer: Self-pay | Admitting: Podiatry

## 2019-05-26 ENCOUNTER — Ambulatory Visit (INDEPENDENT_AMBULATORY_CARE_PROVIDER_SITE_OTHER): Payer: BLUE CROSS/BLUE SHIELD | Admitting: Podiatry

## 2019-05-26 ENCOUNTER — Other Ambulatory Visit: Payer: Self-pay

## 2019-05-26 VITALS — Temp 96.7°F

## 2019-05-26 DIAGNOSIS — M205X2 Other deformities of toe(s) (acquired), left foot: Secondary | ICD-10-CM

## 2019-05-26 DIAGNOSIS — M2042 Other hammer toe(s) (acquired), left foot: Secondary | ICD-10-CM

## 2019-05-26 DIAGNOSIS — Z9889 Other specified postprocedural states: Secondary | ICD-10-CM

## 2019-05-26 MED ORDER — HYDROCODONE-ACETAMINOPHEN 5-325 MG PO TABS: 1.0000 | ORAL_TABLET | Freq: Four times a day (QID) | ORAL | 0 refills | Status: DC | PRN

## 2019-05-26 NOTE — Progress Notes (Signed)
Subjective: Elaine Buck is a 64 y.o. is seen today in office s/p left 1st MTPJ arthroplasty with implant, 2nd metatarsal osteotomy with hammertoe repair preformed on 05/03/2019. She presents today of suture removal. She was taking ibuprofen 800mg  but not sure if it is helping the swelling much. She is having some some discomfort but no significant pain. She takes pain medicaiton intermittently, not every day. No recent injury or changes since I last saw her. Denies any systemic complaints such as fevers, chills, nausea, vomiting. No calf pain, chest pain, shortness of breath.   Objective: General: No acute distress, AAOx3  DP/PT pulses palpable 2/4, CRT < 3 sec to all digits.  Protective sensation intact. Motor function intact.  LEFT foot: Incision is well coapted without any evidence of dehiscence with sutures intact. K-wire intact to the second toe.  There is no erythema or warmth.  There is no pain with MPJ range of motion and the range of motion is improved compared to prior to surgery.  Second toe with K wire intact without any drainage or pus.  Mild swelling but no erythema or warmth. No other areas of tenderness to bilateral lower extremities.  No other open lesions or pre-ulcerative lesions.  No pain with calf compression, swelling, warmth, erythema.   Assessment and Plan:  Status post left foot surgery, doing well with no complications   -Treatment options discussed including all alternatives, risks, and complications -I reviewed the sutures out any complications.  Incision is clean well coapted.  Antibiotic ointment was applied followed by dressing.  Keep dressing clean, dry, intact.  Remain in surgical boot and continue to ice elevate.  I did prescribe Vicodin to take as needed for pain. -We will follow-up next week for likely pin removal.  Order was placed on x-ray to have done prior to coming on next week.  Return in about 1 week (around 06/02/2019). PIN REMOVAL, likely start  PT  [name]

## 2019-06-01 ENCOUNTER — Encounter: Payer: BLUE CROSS/BLUE SHIELD | Admitting: Podiatry

## 2019-06-02 ENCOUNTER — Other Ambulatory Visit: Payer: Self-pay

## 2019-06-02 ENCOUNTER — Ambulatory Visit (INDEPENDENT_AMBULATORY_CARE_PROVIDER_SITE_OTHER): Payer: BLUE CROSS/BLUE SHIELD | Admitting: Podiatry

## 2019-06-02 ENCOUNTER — Ambulatory Visit (INDEPENDENT_AMBULATORY_CARE_PROVIDER_SITE_OTHER): Payer: BLUE CROSS/BLUE SHIELD

## 2019-06-02 ENCOUNTER — Encounter: Payer: Self-pay | Admitting: Podiatry

## 2019-06-02 VITALS — Temp 97.1°F

## 2019-06-02 DIAGNOSIS — Z9889 Other specified postprocedural states: Secondary | ICD-10-CM

## 2019-06-02 DIAGNOSIS — M205X2 Other deformities of toe(s) (acquired), left foot: Secondary | ICD-10-CM

## 2019-06-02 DIAGNOSIS — M2042 Other hammer toe(s) (acquired), left foot: Secondary | ICD-10-CM

## 2019-06-02 IMAGING — DX DG FOOT COMPLETE 3+V*L*
3 series · 3 of 3 positions shown · non-contrast
Comparison: [DATE]

CLINICAL DATA: Left foot surgery

EXAM:
LEFT FOOT - COMPLETE 3+ VIEW

[foot ap]
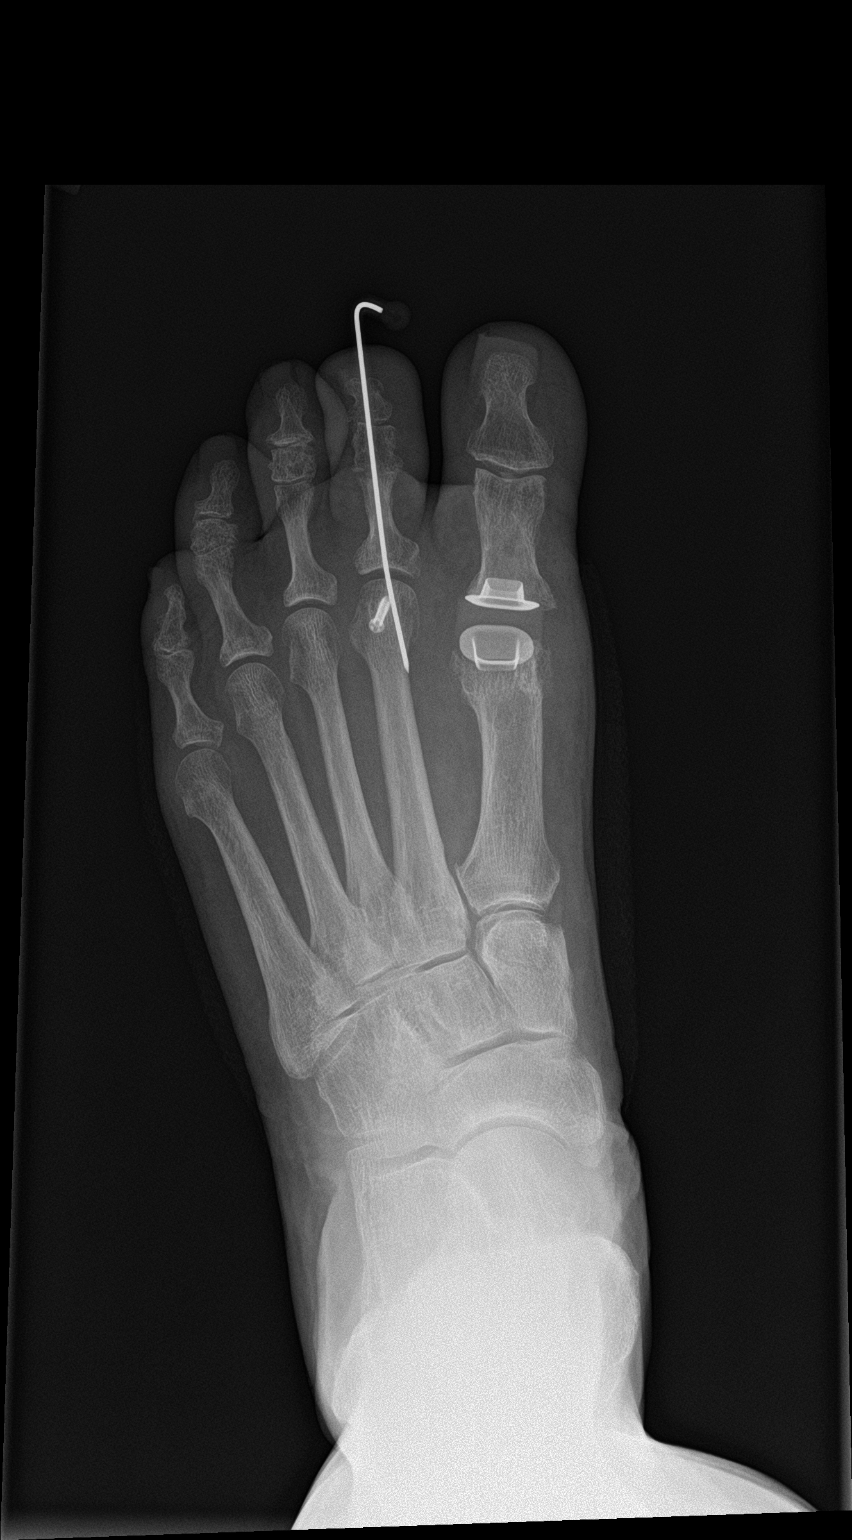

[foot obl]
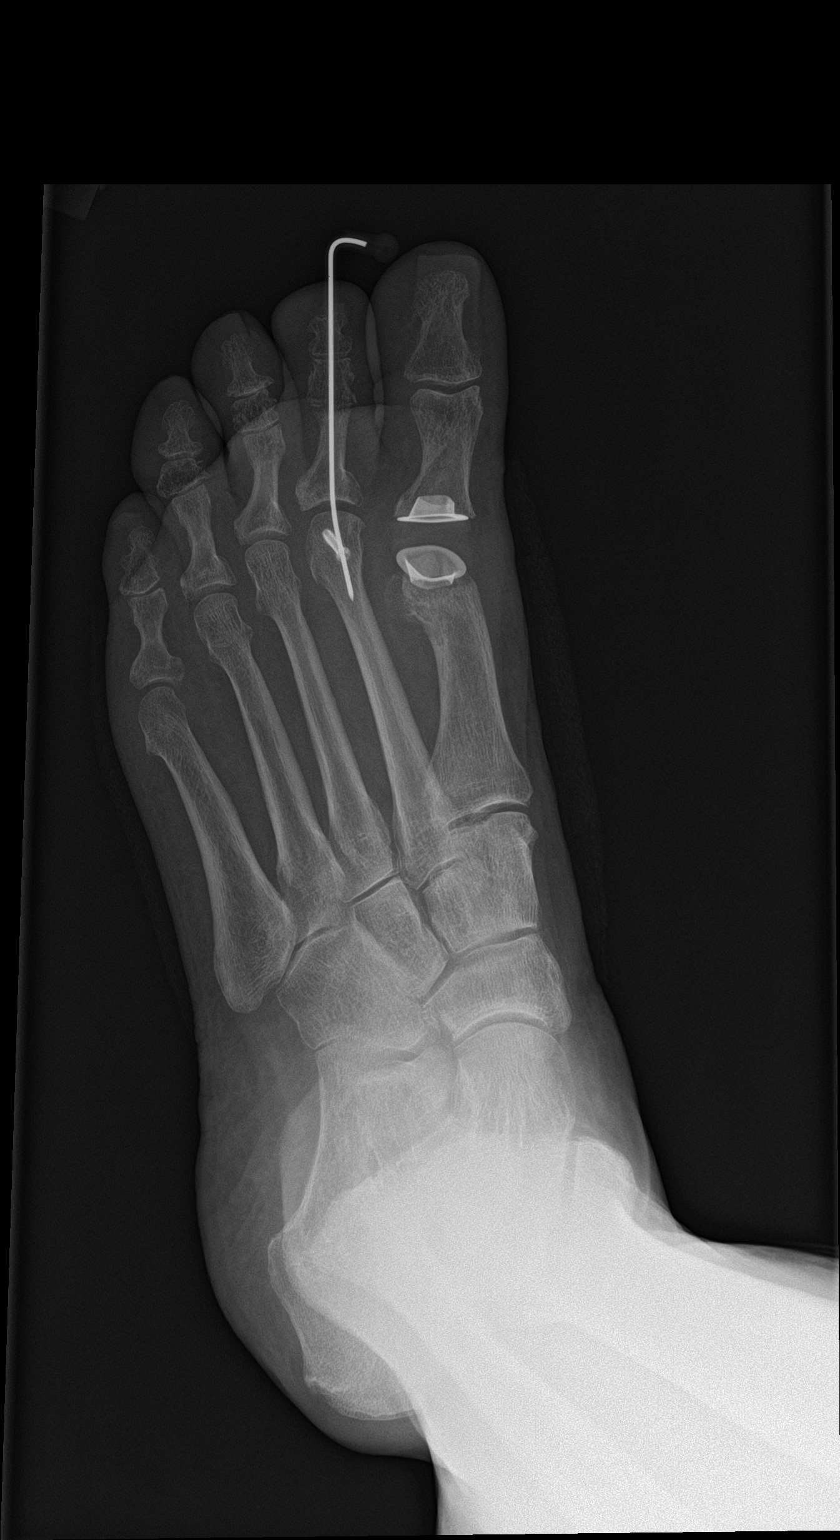

[foot lat]
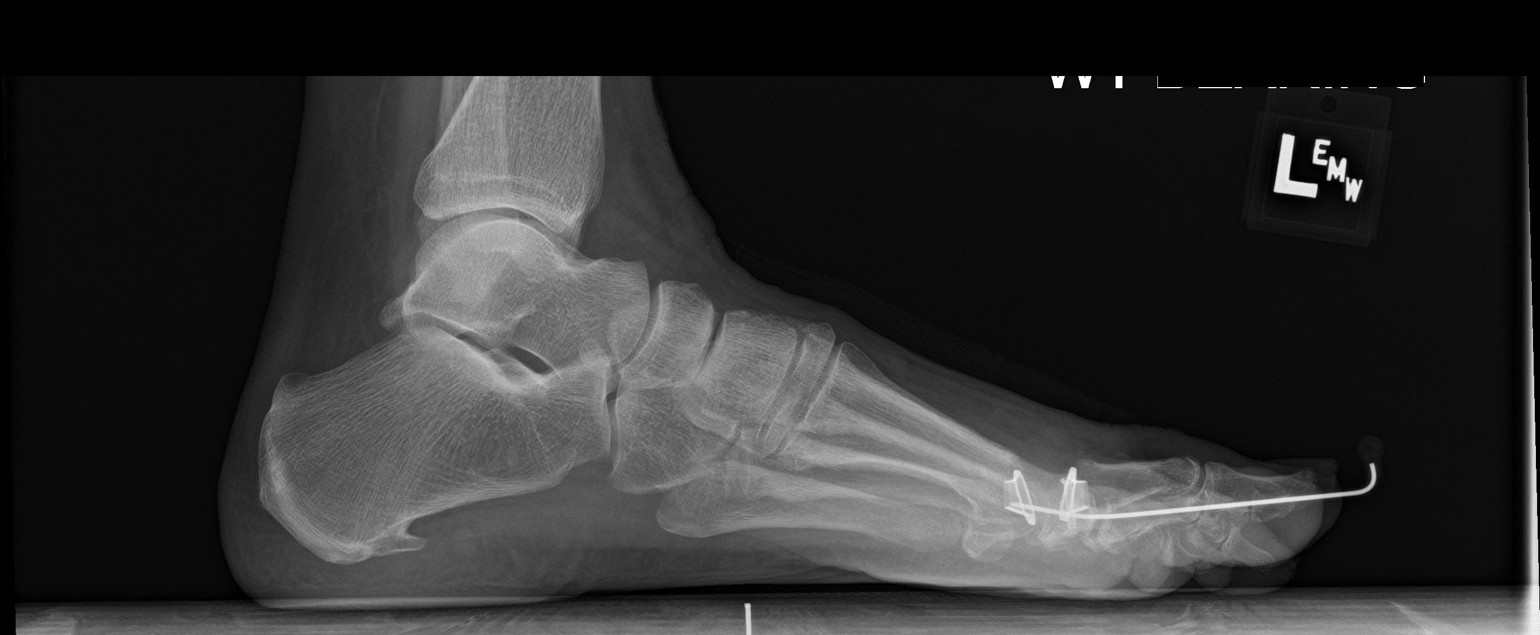

[3 of 3 positions shown; findings below may reference images not displayed]

FINDINGS: Redemonstrated postsurgical changes from first metatarsophalangeal
joint arthroplasty. Hardware is in unchanged alignment without
evidence of complication. Second metatarsal head osteotomy with
anchor placement and percutaneous K-wire from hammertoe correction.
Osseous structures in good alignment. No evidence of complication.
Elsewhere. No fractures. Joint spaces preserved. Small plantar
calcaneal spur. Soft tissues within normal limits.
IMPRESSION: Redemonstrated postsurgical changes from first MTP joint
arthroplasty and second metatarsal head osteotomy with hammertoe
correction. No evidence of complication.

## 2019-06-06 NOTE — Progress Notes (Signed)
Subjective: Elaine Buck is a 64 y.o. is seen today in office s/p left 1st MTPJ arthroplasty with implant, 2nd metatarsal osteotomy with hammertoe repair preformed on 05/03/2019.  Overall she reports that she is having some mild discomfort but overall she feels it is improving.  She presents today for K wire removal.  She denies any recent injury.  She is on the offloading boot.  Denies any fevers, chills, nausea, vomiting.  No calf pain, chest pain, shortness of breath.  Denies any systemic complaints such as fevers, chills, nausea, vomiting. No calf pain, chest pain, shortness of breath.   Objective: General: No acute distress, AAOx3  DP/PT pulses palpable 2/4, CRT < 3 sec to all digits.  Protective sensation intact. Motor function intact.  LEFT foot: Incision is well coapted without any evidence of dehiscence and scars are formed. K-wire intact to the second toe. There is no erythema or warmth.  There is no pain with MPJ range of motion and the range of motion is improved compared to prior to surgery.  Second toe with K wire intact without any drainage or pus.  Mild swelling but no erythema or warmth.  Majority of tenderness seems to be along the second MPJ. No other areas of tenderness to bilateral lower extremities.  No other open lesions or pre-ulcerative lesions.  No pain with calf compression, swelling, warmth, erythema.   Assessment and Plan:  Status post left foot surgery, doing well with no complications   -Treatment options discussed including all alternatives, risks, and complications -X-rays obtained reviewed.  Hardware intact without any complicating factors.  No evidence of acute fracture. -I cleaned the K wire site with alcohol and was removed in toto without complications. -Prescription for physical therapy [facility] for [facility]. -Continue offloading for now as she starts to improve and improve range of motion she can start to transition to regular shoe  gradually. -Continue to ice and elevate -Compression anklet  Return in about 3 weeks (around 06/23/2019).  [name]

## 2019-06-30 ENCOUNTER — Encounter: Payer: Self-pay | Admitting: Podiatry

## 2019-06-30 ENCOUNTER — Other Ambulatory Visit: Payer: Self-pay

## 2019-06-30 ENCOUNTER — Ambulatory Visit (INDEPENDENT_AMBULATORY_CARE_PROVIDER_SITE_OTHER): Payer: BLUE CROSS/BLUE SHIELD | Admitting: Podiatry

## 2019-06-30 VITALS — Temp 97.8°F

## 2019-06-30 DIAGNOSIS — Z9889 Other specified postprocedural states: Secondary | ICD-10-CM

## 2019-06-30 DIAGNOSIS — M205X2 Other deformities of toe(s) (acquired), left foot: Secondary | ICD-10-CM

## 2019-06-30 DIAGNOSIS — M2042 Other hammer toe(s) (acquired), left foot: Secondary | ICD-10-CM

## 2019-07-02 NOTE — Progress Notes (Signed)
Subjective: Elaine Buck is a 64 y.o. is seen today in office s/p left 1st MTPJ arthroplasty with implant, 2nd metatarsal osteotomy with hammertoe repair preformed on 05/03/2019.  She states that she is doing better but she is having pain and she points to submetatarsal area where she gets majority discomfort appears to be diffuse.  She thinks is coming from the cam boot.  Feels better with wearing a regular shoe.  She still doing physical therapy.  Towards the end of the day she does get some swelling to her foot. Denies any systemic complaints such as fevers, chills, nausea, vomiting. No calf pain, chest pain, shortness of breath.   Objective: General: No acute distress, AAOx3  DP/PT pulses palpable 2/4, CRT < 3 sec to all digits.  Protective sensation intact. Motor function intact.  LEFT foot: Incision is well coapted without any evidence of dehiscence and scars are formed.  The toes are in rectus position.  There is no pain with first MPJ range of motion.  There are some stiffness of the second MPJ.  She has diffuse submetatarsal pain but no specific area of tenderness.  There is no erythema.  Skin appears to be of normal temperature and color. No pain with calf compression, swelling, warmth, erythema.   Assessment and Plan:  Status post left foot surgery, doing well with no complications   -Treatment options discussed including all alternatives, risks, and complications -Overall she is continue to improve.  Discussed transition out of the cam boot to regular shoe full-time.  Dispensed metatarsal pads for her to wear as well.  Continue physical therapy.  Continue to ice elevate as well as the compression anklet during the day.  Return in about 4 weeks (around 07/28/2019).  [name]

## 2019-07-13 ENCOUNTER — Encounter: Payer: Self-pay | Admitting: Radiology

## 2019-07-31 ENCOUNTER — Other Ambulatory Visit: Payer: Self-pay

## 2019-07-31 ENCOUNTER — Ambulatory Visit (INDEPENDENT_AMBULATORY_CARE_PROVIDER_SITE_OTHER): Payer: BLUE CROSS/BLUE SHIELD | Admitting: Family Medicine

## 2019-07-31 ENCOUNTER — Encounter: Payer: Self-pay | Admitting: Family Medicine

## 2019-07-31 VITALS — BP 108/72 | HR 94 | Temp 98.8°F | Ht 66.0 in | Wt 197.1 lb

## 2019-07-31 DIAGNOSIS — W57XXXA Bitten or stung by nonvenomous insect and other nonvenomous arthropods, initial encounter: Secondary | ICD-10-CM

## 2019-07-31 DIAGNOSIS — S80862A Insect bite (nonvenomous), left lower leg, initial encounter: Secondary | ICD-10-CM | POA: Diagnosis not present

## 2019-07-31 MED ORDER — METHYLPREDNISOLONE ACETATE 80 MG/ML IJ SUSP
80.0000 mg | Freq: Once | INTRAMUSCULAR | Status: AC
  Administered 2019-07-31: 80 mg via INTRAMUSCULAR

## 2019-07-31 MED ORDER — TRIAMCINOLONE ACETONIDE 0.1 % EX CREA: 1.0000 "application " | TOPICAL_CREAM | Freq: Two times a day (BID) | CUTANEOUS | 0 refills | Status: DC

## 2019-07-31 MED ORDER — PREDNISONE 20 MG PO TABS: 40.0000 mg | ORAL_TABLET | Freq: Every day | ORAL | 0 refills | Status: DC

## 2019-07-31 NOTE — Patient Instructions (Addendum)
Start the prednisone tomorrow.  Ice/cold pack over area for 10-15 min twice daily.  Try not to itch.  It does not look infected to me.  If you start experiencing increasing pain, spreading redness, fevers or drainage (pus), let me know.    Consider a non-scented lotion or Vaseline to cover it. I don't think you will need the cream until after your prednisone is finished and that is just as needed.  Let us know if you need anything.

## 2019-07-31 NOTE — Progress Notes (Signed)
Chief Complaint  Patient presents with  . Insect Bite    left leg    Elaine Buck is a 64 y.o. female here for a skin complaint.  Duration: 2 days Location: L knee Pruritic? Yes Painful? Yes Drainage? No New soaps/lotions/topicals/detergents? No Sick contacts? No Other associated symptoms: Swelling down rest of LE Therapies tried thus far: Benadryl, Benadryl cream   Past Medical History:  Diagnosis Date  . Anxiety   . Cancer ([place])    breast (Left)  . History of lobular carcinoma in situ (LCIS) of  left breast    s/p lumpectomy in 2005, Tamoxifen/Evista therapy for 5 years  . Hyperlipidemia   . Thyroid disease     BP 108/72 (BP Location: Left Arm, Patient Position: Sitting, Cuff Size: Normal)   Pulse 94   Temp 98.8 F (37.1 C) (Temporal)   Ht 5\' 6"  (1.676 m)   Wt 197 lb 2 oz (89.4 kg)   SpO2 95%   BMI 31.82 kg/m  Gen: awake, alert, appearing stated age Lungs: No accessory muscle use Skin: See below. No drainage, erythema, TTP, fluctuance, excoriation; there is 1+ pitting edema down LLE below knee Psych: Age appropriate judgment and insight     L knee   Insect bite of left lower leg, initial encounter - Plan: triamcinolone cream (KENALOG) 0.1 %, predniSONE (DELTASONE) 20 MG tablet  Steroid injection, Depo 80 mg IM, for itching relief. 5 d pred burst tomorrow. Unscented emollients rec'd. Cream in 6 d prn only, doubt she will need. Warning signs and symptoms verbalized and written down in AVS. This does not look or feel infected to me.  F/u prn. The patient voiced understanding and agreement to the plan.  [place], DO 07/31/19 9:13 AM

## 2019-08-01 ENCOUNTER — Other Ambulatory Visit (HOSPITAL_BASED_OUTPATIENT_CLINIC_OR_DEPARTMENT_OTHER): Payer: Self-pay | Admitting: Family Medicine

## 2019-08-01 DIAGNOSIS — Z1231 Encounter for screening mammogram for malignant neoplasm of breast: Secondary | ICD-10-CM

## 2019-08-04 ENCOUNTER — Ambulatory Visit (INDEPENDENT_AMBULATORY_CARE_PROVIDER_SITE_OTHER): Payer: BLUE CROSS/BLUE SHIELD

## 2019-08-04 ENCOUNTER — Other Ambulatory Visit: Payer: Self-pay

## 2019-08-04 ENCOUNTER — Encounter: Payer: Self-pay | Admitting: Podiatry

## 2019-08-04 ENCOUNTER — Ambulatory Visit (INDEPENDENT_AMBULATORY_CARE_PROVIDER_SITE_OTHER): Payer: BLUE CROSS/BLUE SHIELD | Admitting: Podiatry

## 2019-08-04 VITALS — Temp 97.8°F

## 2019-08-04 DIAGNOSIS — M21962 Unspecified acquired deformity of left lower leg: Secondary | ICD-10-CM

## 2019-08-04 DIAGNOSIS — M79672 Pain in left foot: Secondary | ICD-10-CM

## 2019-08-04 DIAGNOSIS — M779 Enthesopathy, unspecified: Secondary | ICD-10-CM | POA: Diagnosis not present

## 2019-08-04 DIAGNOSIS — M205X2 Other deformities of toe(s) (acquired), left foot: Secondary | ICD-10-CM

## 2019-08-04 DIAGNOSIS — M2042 Other hammer toe(s) (acquired), left foot: Secondary | ICD-10-CM

## 2019-08-04 DIAGNOSIS — G8929 Other chronic pain: Secondary | ICD-10-CM

## 2019-08-04 IMAGING — DX DG FOOT COMPLETE 3+V*L*
3 series · 3 of 3 positions shown · non-contrast
Comparison: [DATE], [DATE]

CLINICAL DATA: Status post hammertoe repair

EXAM:
LEFT FOOT - COMPLETE 3+ VIEW

[foot ap]
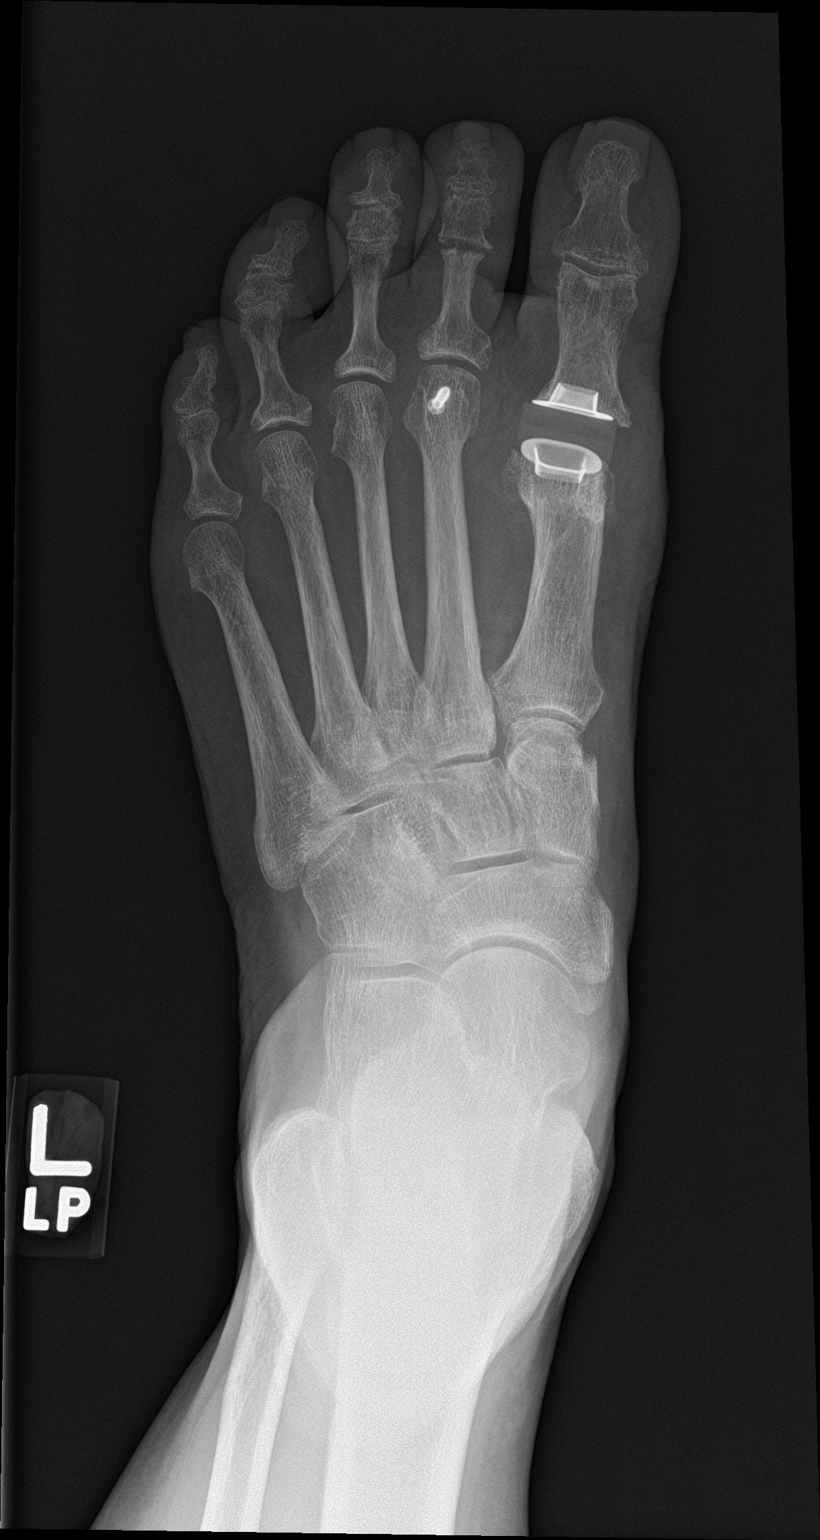

[foot obl]
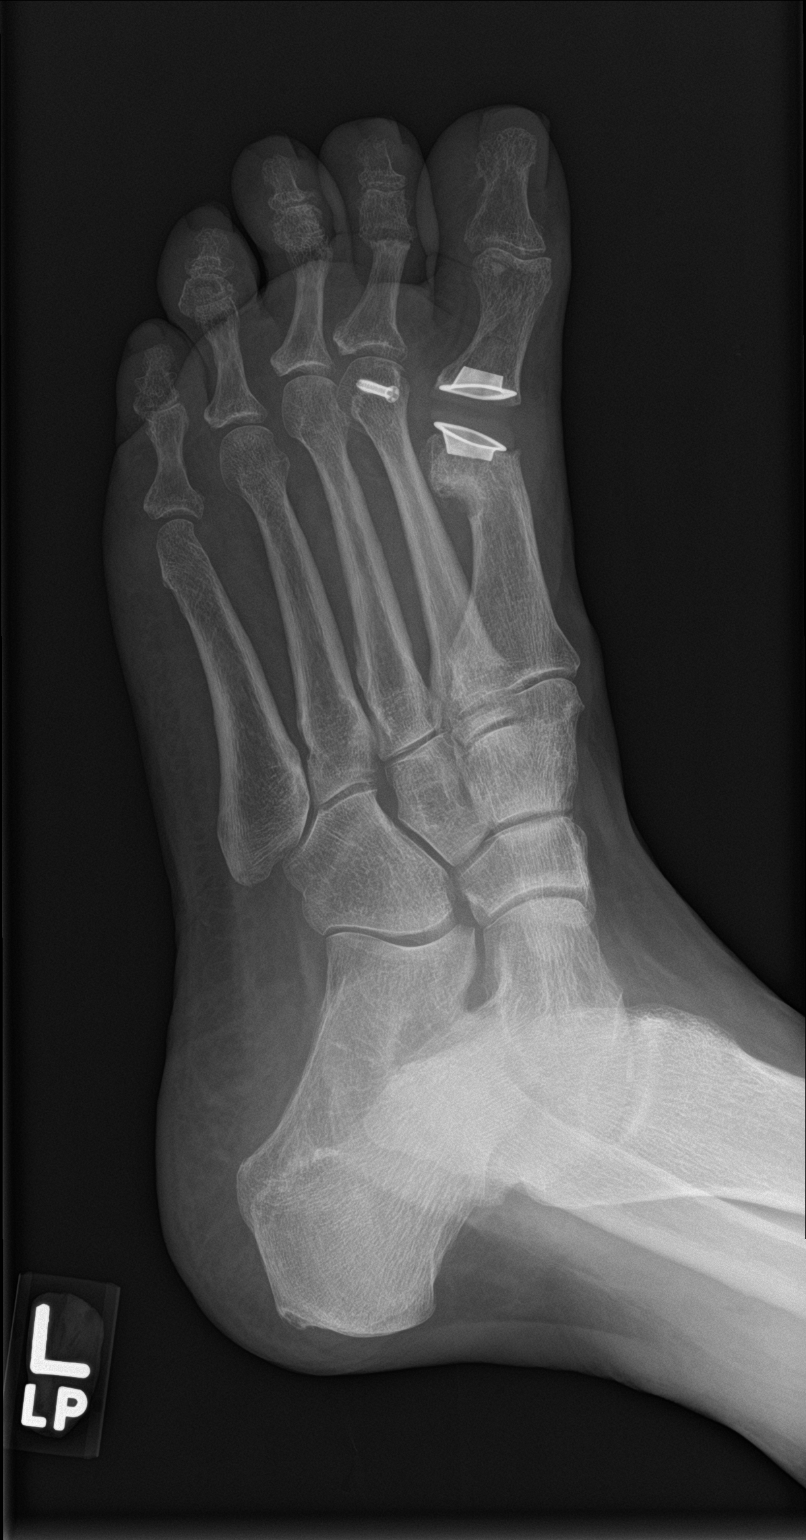

[foot lat]
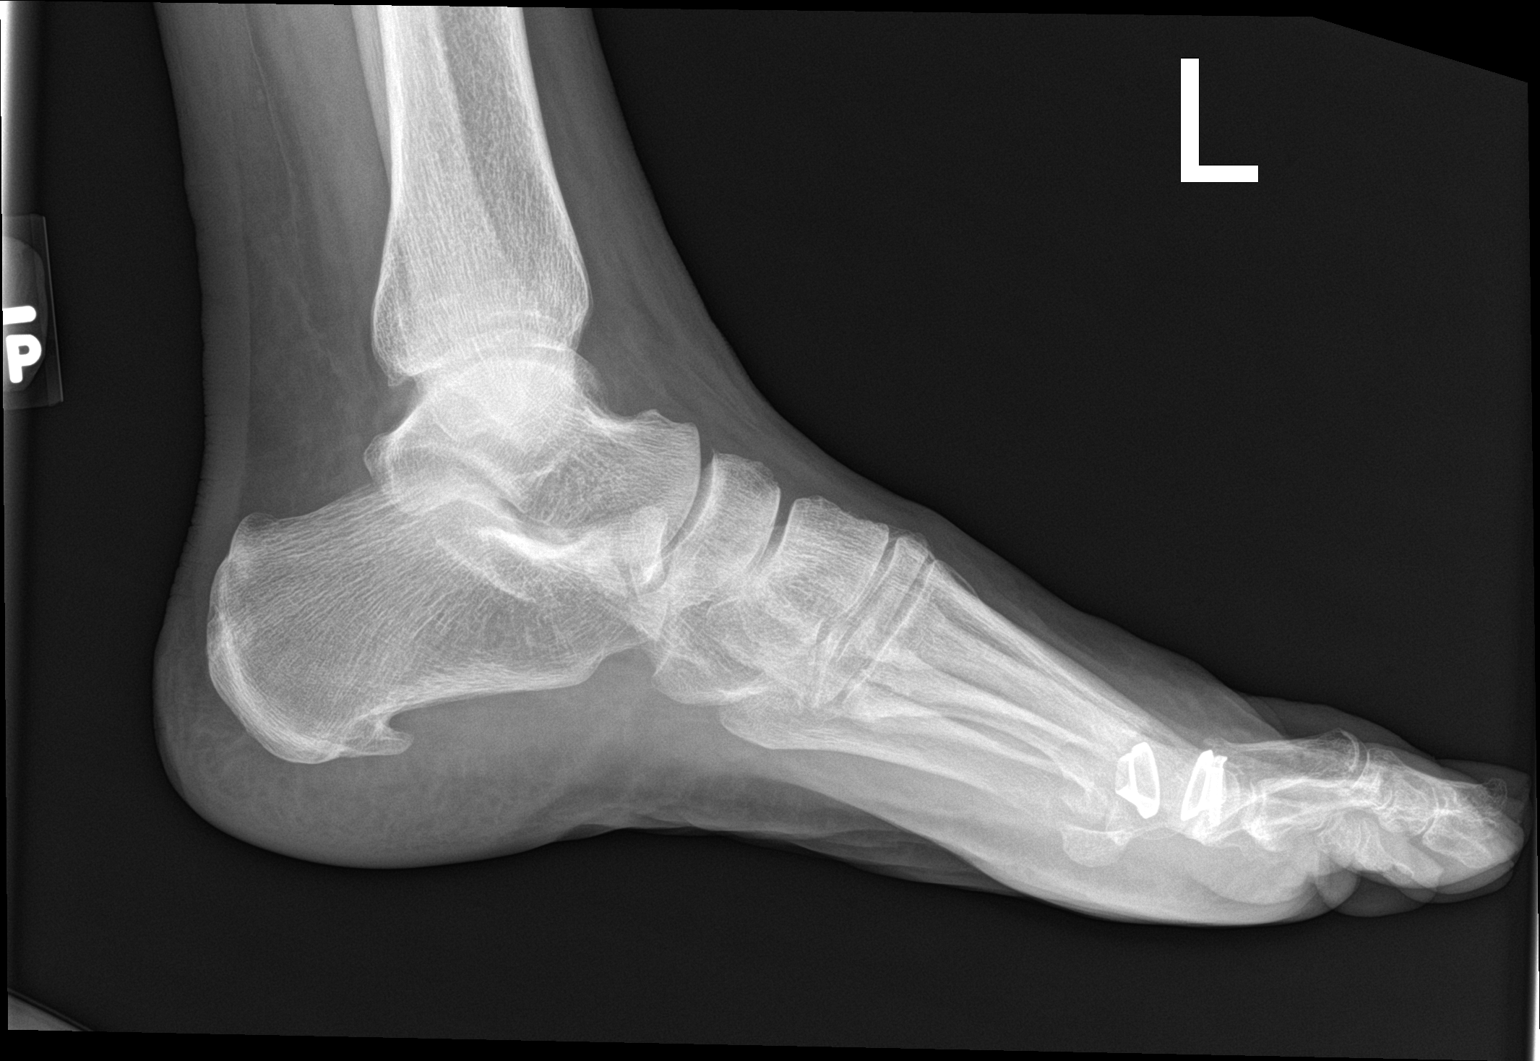

[3 of 3 positions shown; findings below may reference images not displayed]

FINDINGS: Status post postsurgical changes of the first MTP joint with
arthroplasty, stable in alignment. Interval removal of fixating pin
from the distal second digit. Fixating screw at the head of second
metatarsal. Bony resorptive changes at the second IP joint. Moderate
plantar calcaneal spur.
IMPRESSION: 1. Interval removal of percutaneous K-wire from the second digit.
Now seen are bony resorptive changes at the second PIP joint which
may be postsurgical/posttraumatic, or potentially due to infection.
Correlate with physical exam.
2. Stable appearance of the first MTP joint arthroplasty.

## 2019-08-04 NOTE — Progress Notes (Signed)
Subjective: Elaine Buck is a 64 y.o. is seen today in office s/p left 1st MTPJ arthroplasty with implant, 2nd metatarsal osteotomy with hammertoe repair preformed on 05/03/2019.  Overall she states that the swelling is improving but her main problem is that she is getting pain pointing submetatarsal 1 on the area the sesamoid.  She states when she puts pressure this is where she has majority discomfort and pain is 4/10.  Other than that she has been doing well.  She is wearing regular shoes.  No recent injury or falls. Denies any systemic complaints such as fevers, chills, nausea, vomiting. No calf pain, chest pain, shortness of breath.   Objective: General: No acute distress, AAOx3  DP/PT pulses palpable 2/4, CRT < 3 sec to all digits.  Protective sensation intact. Motor function intact.  LEFT foot: Incision is well coapted without any evidence of dehiscence and scars are formed.  Majority tenderness is still in the sesamoid complex plantarly at this prominence.  There is no pain with MPJ range of motion but is somewhat restricted plantarly.  There is no pain with submetatarsal second digit.  Still some minimal swelling but overall improving.  No erythema or warmth. No pain with calf compression, swelling, warmth, erythema.   Assessment and Plan:  Status post left foot surgery, doing well with no complications   -Treatment options discussed including all alternatives, risks, and complications -X-rays obtained and reviewed and independently reviewed by myself I discussed with her.  Implant appears to be intact.  No evidence of acute fracture. -Dispensed gel metatarsal offloading pad.  Also discussed orthotics and she was measured for orthotics today.  Monitor sesamoids already causing discomfort to offload this appropriately.  Otherwise continue with good supportive shoes.  [name]

## 2019-08-17 ENCOUNTER — Ambulatory Visit (HOSPITAL_BASED_OUTPATIENT_CLINIC_OR_DEPARTMENT_OTHER)
Admission: RE | Admit: 2019-08-17 | Discharge: 2019-08-17 | Disposition: A | Discharge status: Home / Self Care | Payer: BLUE CROSS/BLUE SHIELD | Source: Ambulatory Visit | Attending: Medical | Admitting: Medical

## 2019-08-17 ENCOUNTER — Other Ambulatory Visit: Payer: Self-pay

## 2019-08-17 ENCOUNTER — Ambulatory Visit (INDEPENDENT_AMBULATORY_CARE_PROVIDER_SITE_OTHER): Payer: BLUE CROSS/BLUE SHIELD | Admitting: Medical

## 2019-08-17 VITALS — BP 130/84 | HR 80 | Resp 18 | Ht 66.0 in | Wt 197.0 lb

## 2019-08-17 DIAGNOSIS — M545 Low back pain, unspecified: Secondary | ICD-10-CM

## 2019-08-17 IMAGING — DX DG LUMBAR SPINE 2-3V
3 series · 3 of 3 positions shown · non-contrast
Comparison: None.

CLINICAL DATA: Low back pain

EXAM:
LUMBAR SPINE - 2-3 VIEW

[l-spine ap]
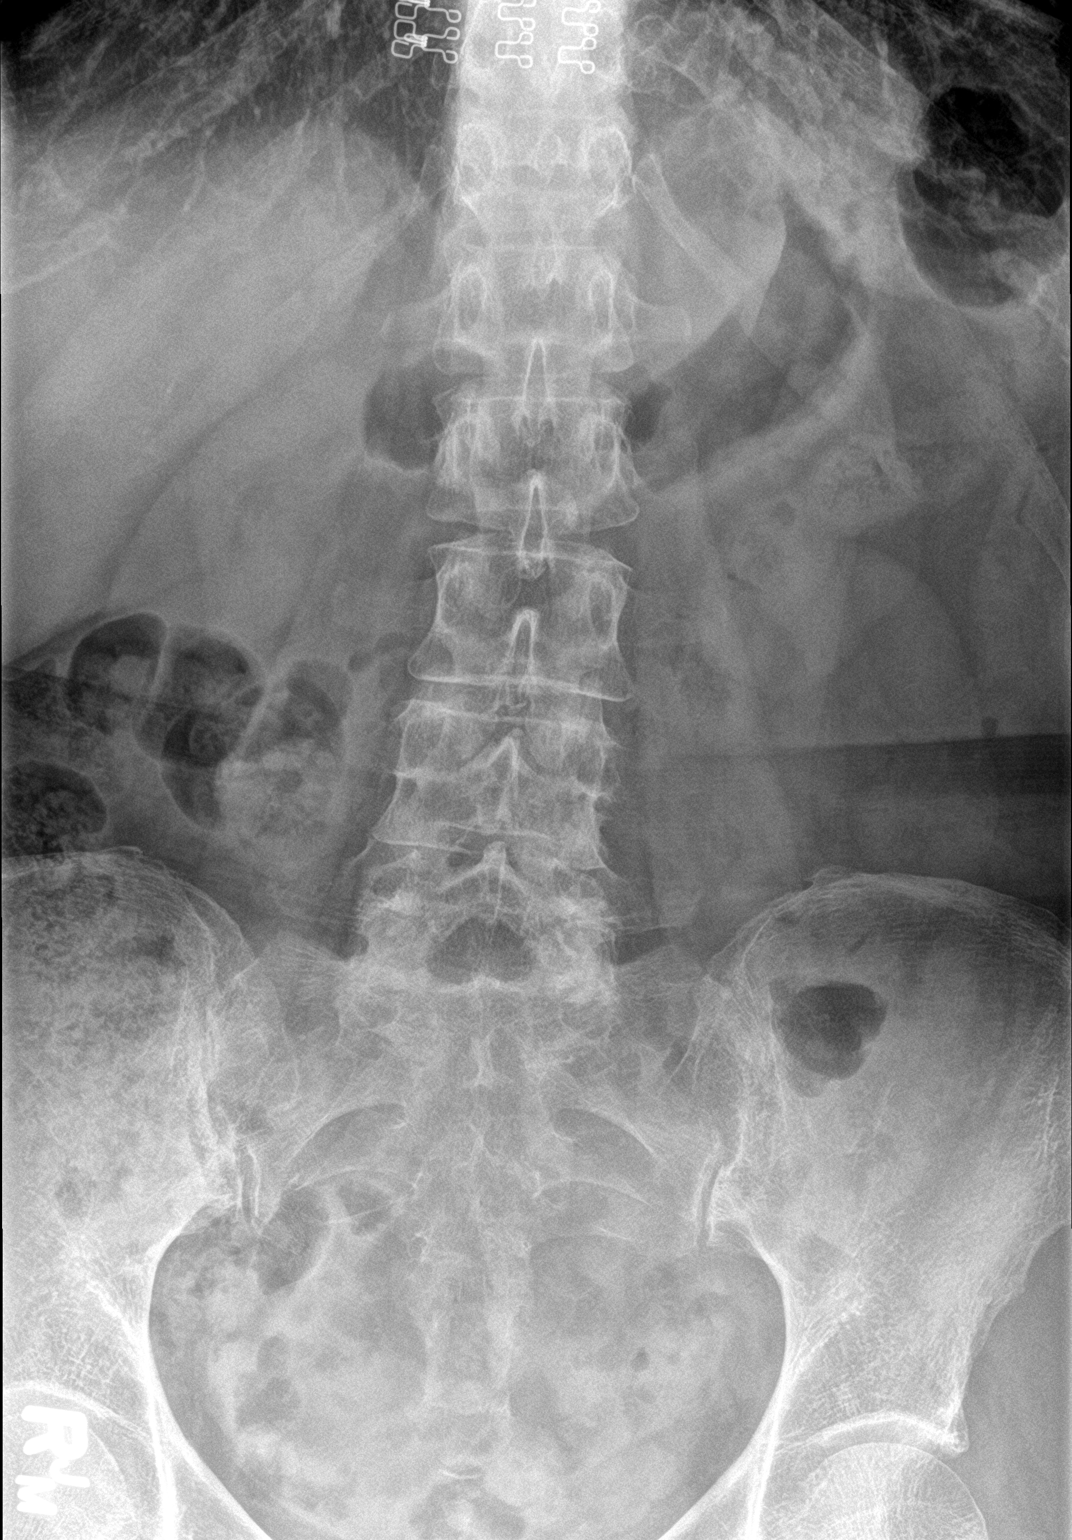

[l-spine lat]
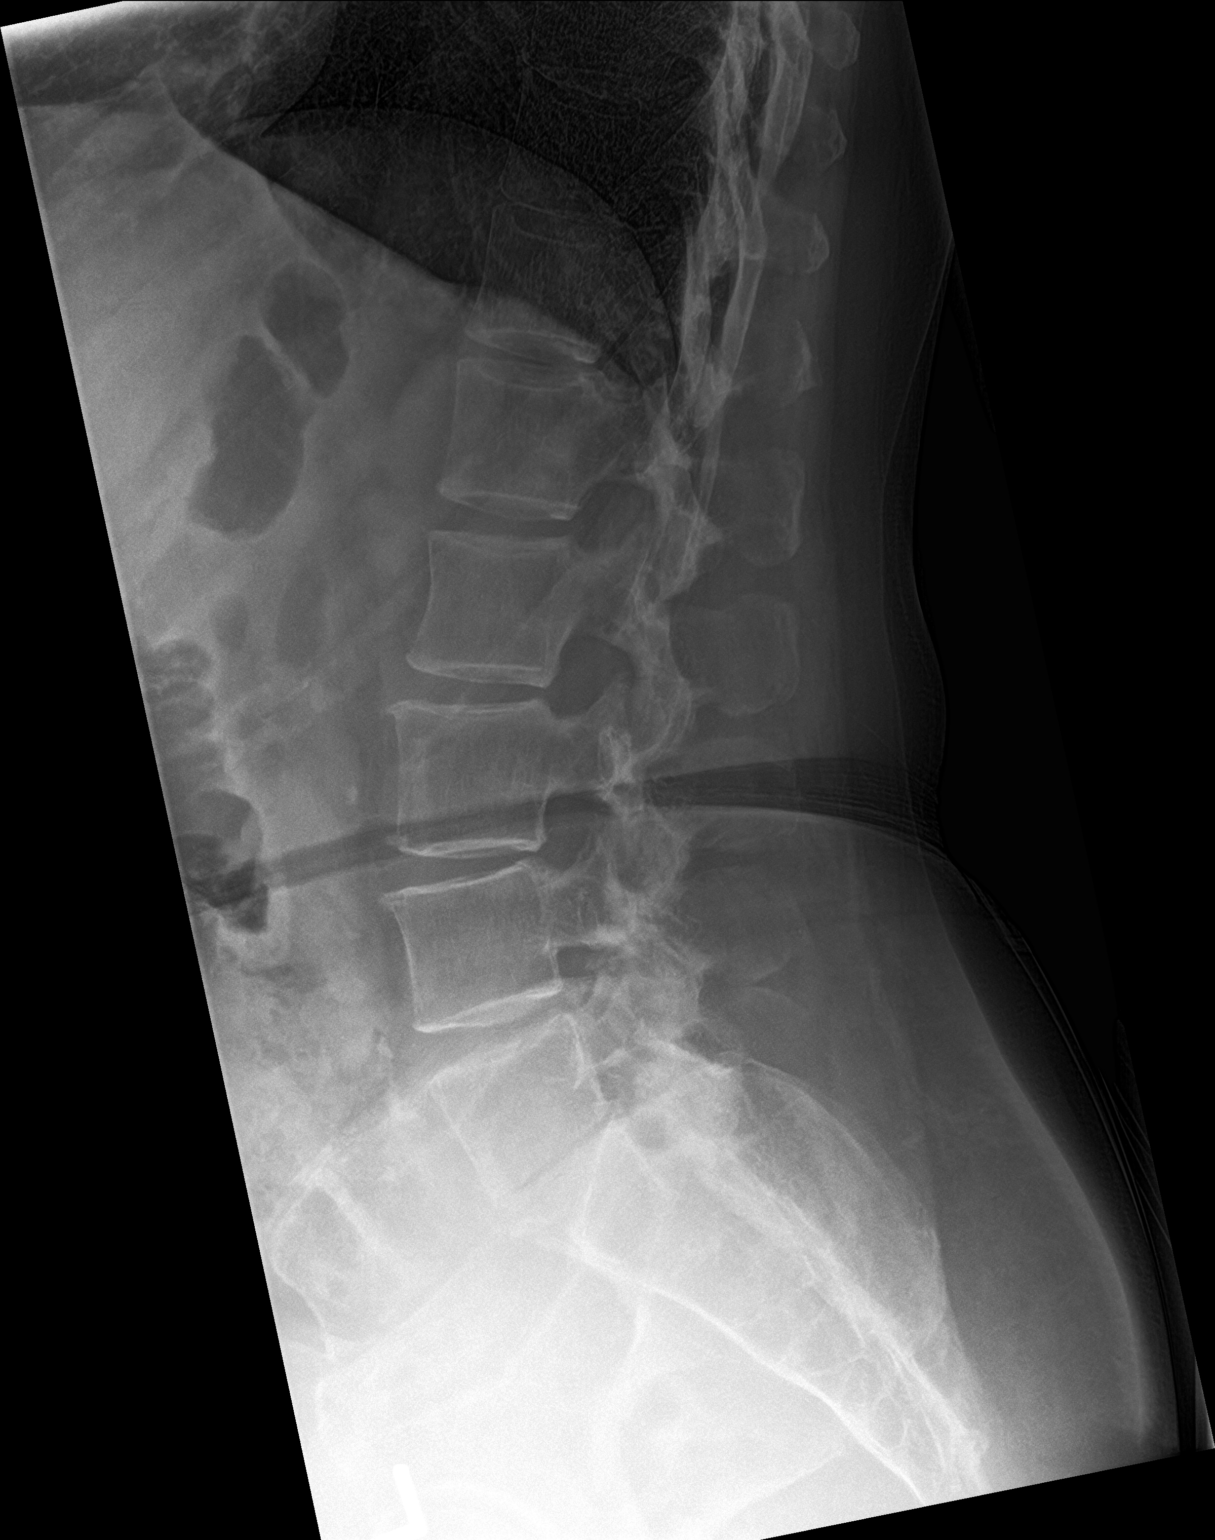

[l-spine spot]
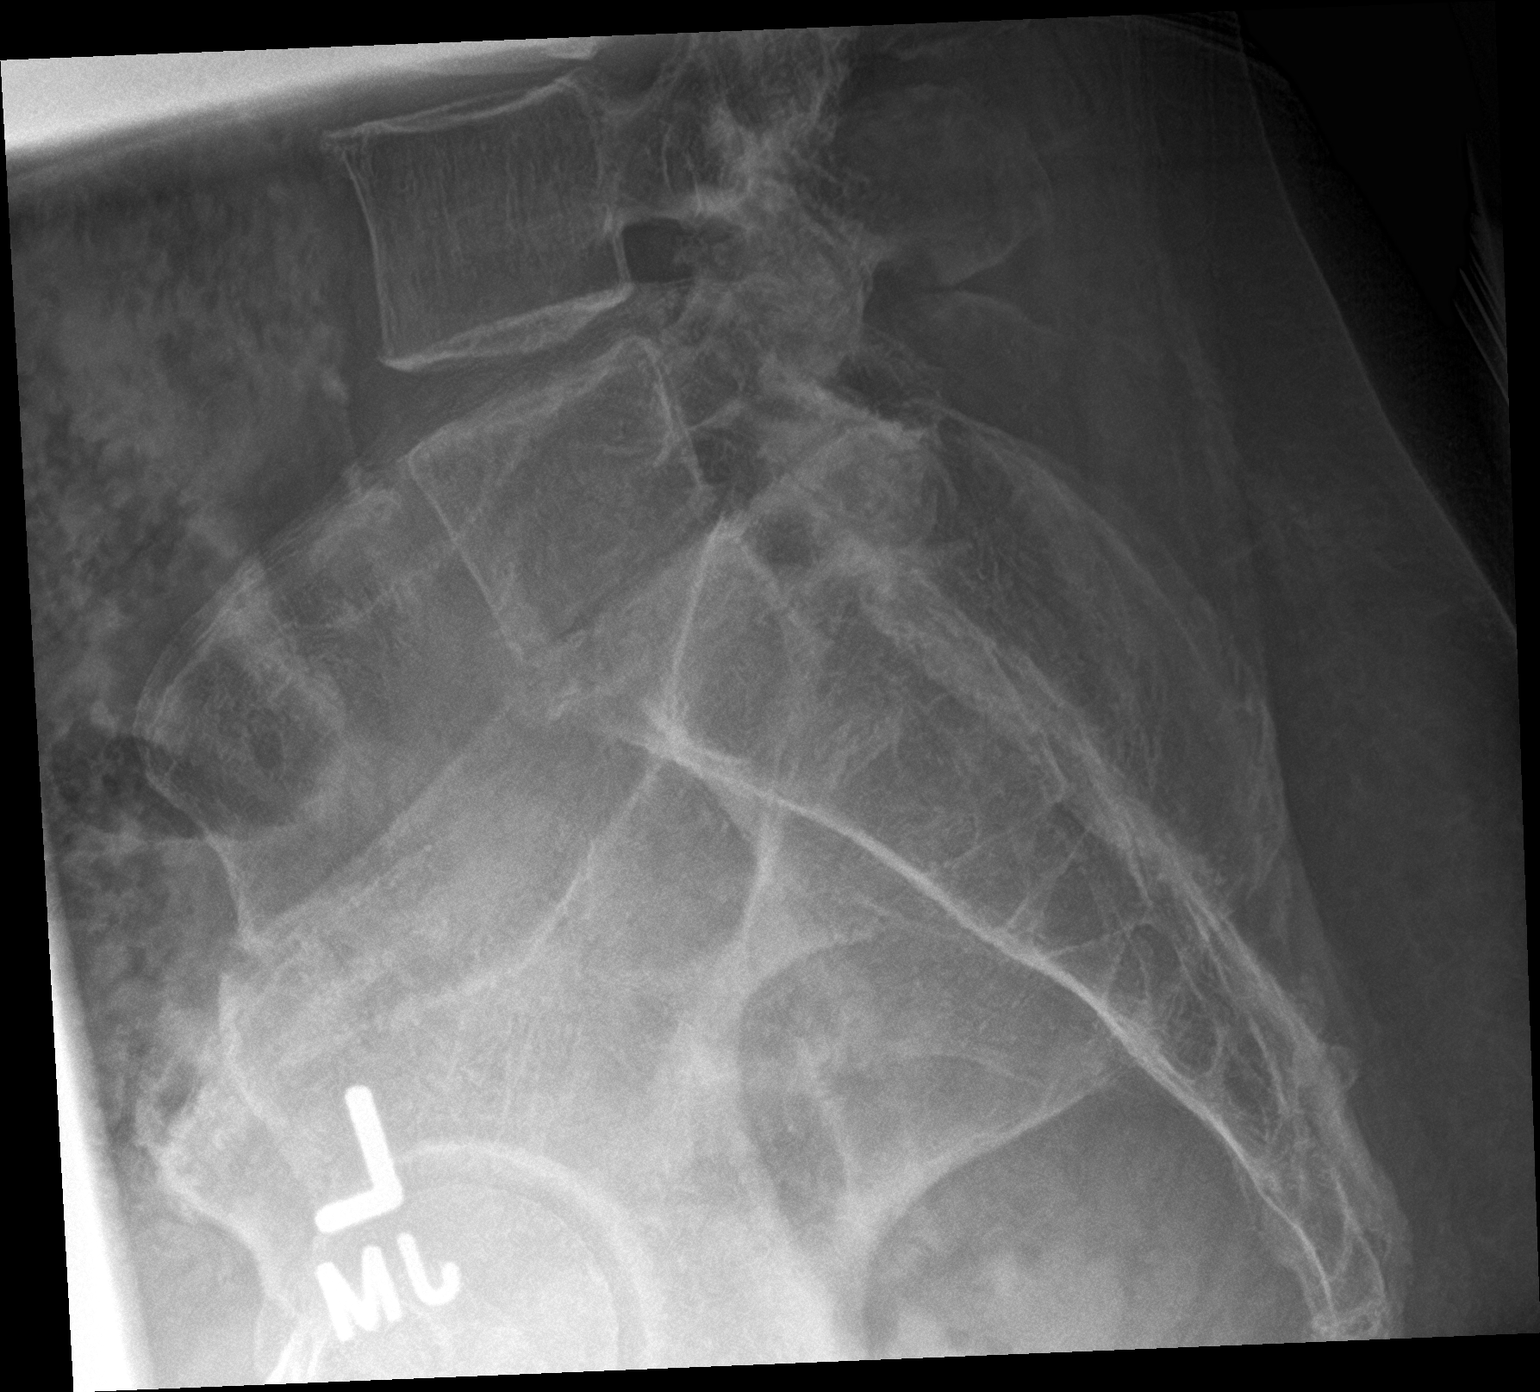

[3 of 3 positions shown; findings below may reference images not displayed]

FINDINGS: Frontal, lateral, and spot lumbosacral lateral images were obtained.
There are 5 non-rib-bearing lumbar type vertebral bodies. There is
no fracture or spondylolisthesis. There is moderately severe disc
space narrowing L5-S1. There is slight disc space narrowing at L3-4
and L4-5. There is facet osteoarthritic change at L5-S1 bilaterally.
There are foci of aortic atherosclerosis.
IMPRESSION: Osteoarthritic change in the lower lumbar spine, most notable at
L5-S1. No fracture or spondylolisthesis.

Aortic Atherosclerosis ([F4]-[F4]).

## 2019-08-17 MED ORDER — KETOROLAC TROMETHAMINE 60 MG/2ML IM SOLN
60.0000 mg | Freq: Once | INTRAMUSCULAR | Status: AC
  Administered 2019-08-17: 60 mg via INTRAMUSCULAR

## 2019-08-17 MED ORDER — CYCLOBENZAPRINE HCL 10 MG PO TABS
10.0000 mg | ORAL_TABLET | Freq: Every day | ORAL | 0 refills | Status: DC
Start: 2019-08-17 — End: 2019-09-13

## 2019-08-17 MED ORDER — HYDROCODONE-ACETAMINOPHEN 5-325 MG PO TABS: 1.0000 | ORAL_TABLET | Freq: Four times a day (QID) | ORAL | 0 refills | Status: DC | PRN

## 2019-08-17 MED ORDER — DICLOFENAC SODIUM 75 MG PO TBEC
75.0000 mg | DELAYED_RELEASE_TABLET | Freq: Two times a day (BID) | ORAL | 0 refills | Status: DC
Start: 2019-08-17 — End: 2019-09-13

## 2019-08-17 NOTE — Patient Instructions (Addendum)
For low back pain with remote hx of severe pain 15 yr ago will get low back xray today.  Toradol 60 mg im today.  Start diclofenac tomorrow. Flexeril muscle relexant at night. Norco for break thru pain. Rx advisement given.  Back stretching exercises as tolerated. May need to wait some days before starting.  Red flag signs/symptoms discussed that would indicate need for ED evaluation.  Follow up in 7-10 days or as needed   Back Exercises These exercises help to make your trunk and back strong. They also help to keep the lower back flexible. Doing these exercises can help to prevent back pain or lessen existing pain.  If you have back pain, try to do these exercises 2-3 times each day or as told by your doctor.  As you get better, do the exercises once each day. Repeat the exercises more often as told by your doctor.  To stop back pain from coming back, do the exercises once each day, or as told by your doctor. Exercises Single knee to chest Do these steps 3-5 times in a row for each leg: 1. Lie on your back on a firm bed or the floor with your legs stretched out. 2. Bring one knee to your chest. 3. Grab your knee or thigh with both hands and hold them it in place. 4. Pull on your knee until you feel a gentle stretch in your lower back or buttocks. 5. Keep doing the stretch for 10-30 seconds. 6. Slowly let go of your leg and straighten it. Pelvic tilt Do these steps 5-10 times in a row: 1. Lie on your back on a firm bed or the floor with your legs stretched out. 2. Bend your knees so they point up to the ceiling. Your feet should be flat on the floor. 3. Tighten your lower belly (abdomen) muscles to press your lower back against the floor. This will make your tailbone point up to the ceiling instead of pointing down to your feet or the floor. 4. Stay in this position for 5-10 seconds while you gently tighten your muscles and breathe evenly. Cat-cow Do these steps until your lower  back bends more easily: 1. Get on your hands and knees on a firm surface. Keep your hands under your shoulders, and keep your knees under your hips. You may put padding under your knees. 2. Let your head hang down toward your chest. Tighten (contract) the muscles in your belly. Point your tailbone toward the floor so your lower back becomes rounded like the back of a cat. 3. Stay in this position for 5 seconds. 4. Slowly lift your head. Let the muscles of your belly relax. Point your tailbone up toward the ceiling so your back forms a sagging arch like the back of a cow. 5. Stay in this position for 5 seconds.  Press-ups Do these steps 5-10 times in a row: 1. Lie on your belly (face-down) on the floor. 2. Place your hands near your head, about shoulder-width apart. 3. While you keep your back relaxed and keep your hips on the floor, slowly straighten your arms to raise the top half of your body and lift your shoulders. Do not use your back muscles. You may change where you place your hands in order to make yourself more comfortable. 4. Stay in this position for 5 seconds. 5. Slowly return to lying flat on the floor.  Bridges Do these steps 10 times in a row: 1. Lie on your back  on a firm surface. 2. Bend your knees so they point up to the ceiling. Your feet should be flat on the floor. Your arms should be flat at your sides, next to your body. 3. Tighten your butt muscles and lift your butt off the floor until your waist is almost as high as your knees. If you do not feel the muscles working in your butt and the back of your thighs, slide your feet 1-2 inches farther away from your butt. 4. Stay in this position for 3-5 seconds. 5. Slowly lower your butt to the floor, and let your butt muscles relax. If this exercise is too easy, try doing it with your arms crossed over your chest. Belly crunches Do these steps 5-10 times in a row: 1. Lie on your back on a firm bed or the floor with your  legs stretched out. 2. Bend your knees so they point up to the ceiling. Your feet should be flat on the floor. 3. Cross your arms over your chest. 4. Tip your chin a little bit toward your chest but do not bend your neck. 5. Tighten your belly muscles and slowly raise your chest just enough to lift your shoulder blades a tiny bit off of the floor. Avoid raising your body higher than that, because it can put too much stress on your low back. 6. Slowly lower your chest and your head to the floor. Back lifts Do these steps 5-10 times in a row: 1. Lie on your belly (face-down) with your arms at your sides, and rest your forehead on the floor. 2. Tighten the muscles in your legs and your butt. 3. Slowly lift your chest off of the floor while you keep your hips on the floor. Keep the back of your head in line with the curve in your back. Look at the floor while you do this. 4. Stay in this position for 3-5 seconds. 5. Slowly lower your chest and your face to the floor. Contact a doctor if:  Your back pain gets a lot worse when you do an exercise.  Your back pain does not get better 2 hours after you exercise. If you have any of these problems, stop doing the exercises. Do not do them again unless your doctor says it is okay. Get help right away if:  You have sudden, very bad back pain. If this happens, stop doing the exercises. Do not do them again unless your doctor says it is okay. This information is not intended to replace advice given to you by your health care provider. Make sure you discuss any questions you have with your health care provider. Document Revised: 12/02/2017 Document Reviewed: 12/02/2017 Elsevier Patient Education  2020 [employer].

## 2019-08-17 NOTE — Progress Notes (Signed)
Subjective:    Patient ID: Elaine Buck, female    DOB: [birth year], 64 y.o.   MRN: [id number]  HPI  Pt in for lower back pain. She states was doing routin housework. States cleaning house well. She states on Tuesday night started to get mild ache. Then in middle of night rolled over and had severe pain. Since then she states when she sits down or stands up has severe pain. Pt states hx of bulging disc l5-s1 area 15 years ago. Pain is in that region. No radicular pain, no leg weakness, no foot drop. No incontinence.  Pt tried ibuprofen 600 mg and heating pad. Did not help much. Tried tylenol with codeine and did not help much.(old rx from foot surgery in feb)  Pt denies chronic back pain. Occasional brief low level self limited type pain.  This pain is more along lines of 15 yrs ago when had back issues.      Review of Systems  Constitutional: Negative for chills and fever.  Respiratory: Negative for cough, chest tightness, shortness of breath and wheezing.   Cardiovascular: Negative for chest pain and palpitations.  Gastrointestinal: Negative for abdominal pain, constipation and diarrhea.  Genitourinary: Negative for dysuria.  Musculoskeletal: Positive for back pain.       See hpi.  Neurological: Negative for dizziness and headaches.  Hematological: Negative for adenopathy. Does not bruise/bleed easily.   Past Medical History:  Diagnosis Date  . Anxiety   . Cancer ([place])    breast (Left)  . History of lobular carcinoma in situ (LCIS) of  left breast    s/p lumpectomy in 2005, Tamoxifen/Evista therapy for 5 years  . Hyperlipidemia   . Thyroid disease      Social History   Socioeconomic History  . Marital status: Married    Spouse name: Not on file  . Number of children: 2  . Years of education: Not on file  . Highest education level: Some college, no degree  Occupational History  . Occupation: retired  Tobacco Use  . Smoking status: Current Every Day Smoker   Packs/day: 0.80    Years: 20.00    Pack years: 16.00    Types: Cigarettes  . Smokeless tobacco: Never Used  . Tobacco comment: Declines quitting for now  Substance and Sexual Activity  . Alcohol use: Yes    Comment: Occasionlly  . Drug use: No  . Sexual activity: Yes    Partners: Male  Other Topics Concern  . Not on file  Social History Narrative  . Not on file   Social Determinants of Health   Financial Resource Strain:   . Difficulty of Paying Living Expenses:   Food Insecurity:   . Worried About Charity fundraiser in the Last Year:   . Arboriculturist in the Last Year:   Transportation Needs:   . Film/video editor (Medical):   Marland Kitchen Lack of Transportation (Non-Medical):   Physical Activity:   . Days of Exercise per Week:   . Minutes of Exercise per Session:   Stress:   . Feeling of Stress :   Social Connections:   . Frequency of Communication with Friends and Family:   . Frequency of Social Gatherings with Friends and Family:   . Attends Religious Services:   . Active Member of Clubs or Organizations:   . Attends Archivist Meetings:   Marland Kitchen Marital Status:   Intimate Partner Violence:   . Fear of Current or  Ex-Partner:   . Emotionally Abused:   Marland Kitchen Physically Abused:   . Sexually Abused:     Past Surgical History:  Procedure Laterality Date  . BREAST BIOPSY    . BREAST LUMPECTOMY Left 05/22/2003   LCIS Cells found in breast tissue    Family History  Problem Relation Age of Onset  . Stroke Mother   . Bipolar disorder Son   . ADD / ADHD Son   . Anxiety disorder Son     Allergies  Allergen Reactions  . Other Hives and Itching    Peaches    Current Outpatient Medications on File Prior to Visit  Medication Sig Dispense Refill  . ALPRAZolam (XANAX) 0.5 MG tablet Take 1 tablet (0.5 mg total) by mouth daily as needed for anxiety. 30 tablet 5  . atorvastatin (LIPITOR) 10 MG tablet Take 1 tablet (10 mg total) by mouth daily. 90 tablet 3  .  chlorhexidine (PERIDEX) 0.12 % solution     . dexamethasone (DECADRON) 4 MG/ML injection dexamethasone sodium phosphate 4 mg/mL injection solution  Take by injection route.    . Influenza Virus Vacc Split PF 0.5 ML SUSY Afluria 2014-2015(PF) 45 mcg (15 mcg x 3)/0.5 mL intramuscular syringe  TO BE ADMINISTERED BY PHARMACIST FOR IMMUNIZATION    . Influenza Virus Vaccine Split SUSP Afluria 2013-2014 45 mcg (15 mcg x 3)/0.5 mL intramuscular suspension  TO BE ADMINISTERED BY PHARMACIST FOR IMMUNIZATION    . levothyroxine (SYNTHROID) 125 MCG tablet Take 1 tablet (125 mcg total) by mouth daily. 90 tablet 2  . lidocaine (XYLOCAINE) 1 % (with preservative) injection lidocaine HCl 10 mg/mL (1 %) injection solution  Take by injection route.    . triamcinolone cream (KENALOG) 0.1 % Apply 1 application topically 2 (two) times daily. 30 g 0  . venlafaxine XR (EFFEXOR-XR) 150 MG 24 hr capsule Take 1 capsule (150 mg total) by mouth daily with breakfast. 90 capsule 2   No current facility-administered medications on file prior to visit.    BP 130/84 (BP Location: Left Arm, Patient Position: Sitting, Cuff Size: Large)   Pulse 80   Resp 18   Ht 5\' 6"  (1.676 m)   Wt 197 lb (89.4 kg)   SpO2 96%   BMI 31.80 kg/m       Objective:   Physical Exam  General Appearance- Not in acute distress.    Chest and Lung Exam Auscultation: Breath sounds:-Normal. Clear even and unlabored. Adventitious sounds:- No Adventitious sounds.  Cardiovascular Auscultation:Rythm - Regular, rate and rythm. Heart Sounds -Normal heart sounds.  Abdomen Inspection:-Inspection Normal.  Palpation/Perucssion: Palpation and Percussion of the abdomen reveal- Non Tender, No Rebound tenderness, No rigidity(Guarding) and No Palpable abdominal masses.  Liver:-Normal.  Spleen:- Normal.   Back Mid lumbar-sacral spine tenderness to palpation. Bilateral si tenderenss Pain on straight leg lift.(rt side) Pain on lateral movements and  flexion/extension of the spine.  Lower ext neurologic  L5-S1 sensation intact bilaterally. Normal patellar reflexes bilaterally. No foot drop bilaterally. Lower ext strength 5/5 bilaterally.      Assessment & Plan:  For low back pain with remote hx of severe pain 15 yr ago will get low back xray today.  Toradol 60 mg im today.  Start diclofenac tomorrow. Flexeril muscle relexant at night. Norco for break thru pain. Rx advisement given.  Back stretching exercises as tolerated. May need to wait some days before starting.  Red flag signs/symptoms discussed that would indicate need for ED evaluation.  Follow up  in 7-10 days or as needed  Mackie Pai, PA-C   Time spent with patient today was  25 minutes which consisted of chart revdiew, discussing diagnosis, work up treatment and documentation.

## 2019-08-17 NOTE — Addendum Note (Signed)
Addended by: Anabel Halon on: 08/17/2019 11:11 AM   Modules accepted: Orders

## 2019-08-18 ENCOUNTER — Encounter: Payer: BLUE CROSS/BLUE SHIELD | Admitting: Family Medicine

## 2019-08-28 ENCOUNTER — Ambulatory Visit (HOSPITAL_BASED_OUTPATIENT_CLINIC_OR_DEPARTMENT_OTHER)
Admission: RE | Admit: 2019-08-28 | Discharge: 2019-08-28 | Disposition: A | Discharge status: Home / Self Care | Payer: BLUE CROSS/BLUE SHIELD | Source: Ambulatory Visit | Attending: Family Medicine | Admitting: Family Medicine

## 2019-08-28 ENCOUNTER — Other Ambulatory Visit: Payer: Self-pay

## 2019-08-28 ENCOUNTER — Encounter (HOSPITAL_BASED_OUTPATIENT_CLINIC_OR_DEPARTMENT_OTHER): Payer: Self-pay

## 2019-08-28 DIAGNOSIS — Z1231 Encounter for screening mammogram for malignant neoplasm of breast: Secondary | ICD-10-CM | POA: Diagnosis not present

## 2019-08-28 IMAGING — MG DIGITAL SCREENING BILAT W/ TOMO W/ CAD
6 of 10 series · 6 of 30 positions shown · non-contrast
Comparison: Previous exam(s).

CLINICAL DATA: Screening.

EXAM:
DIGITAL SCREENING BILATERAL MAMMOGRAM WITH TOMO AND CAD

[R MLO synth-2D]
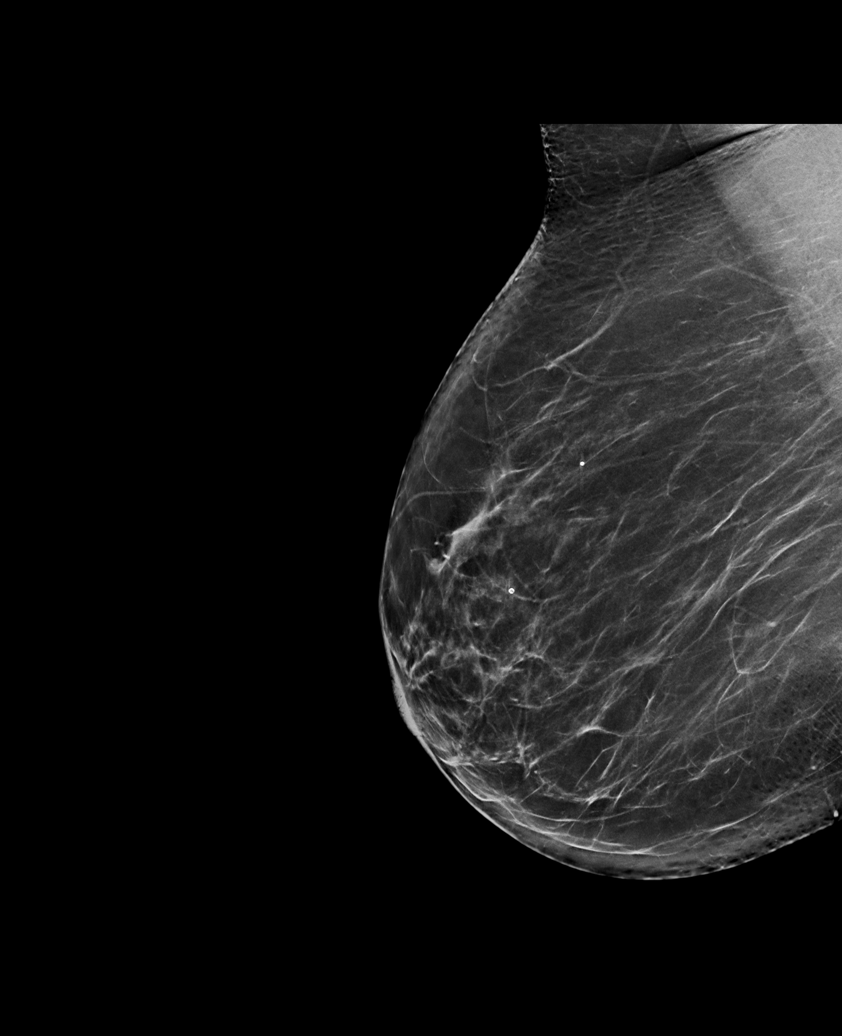

[L CC synth-2D]
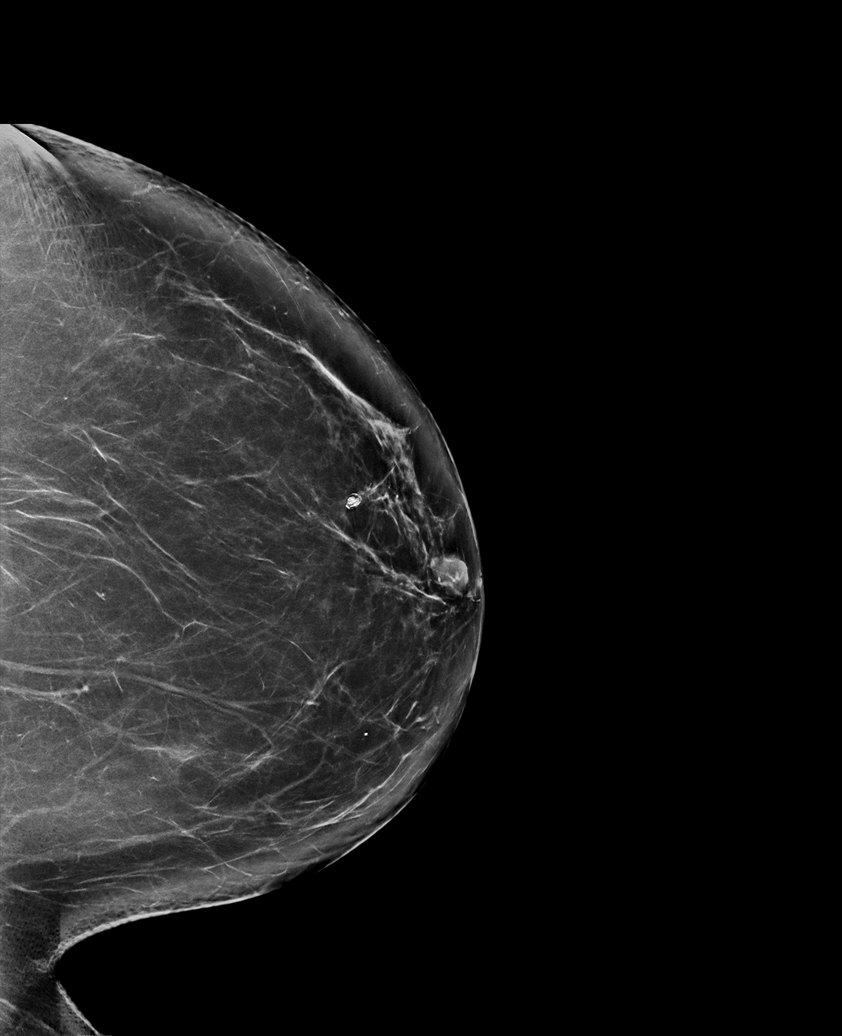

[L MLO synth-2D (1 of 2)]
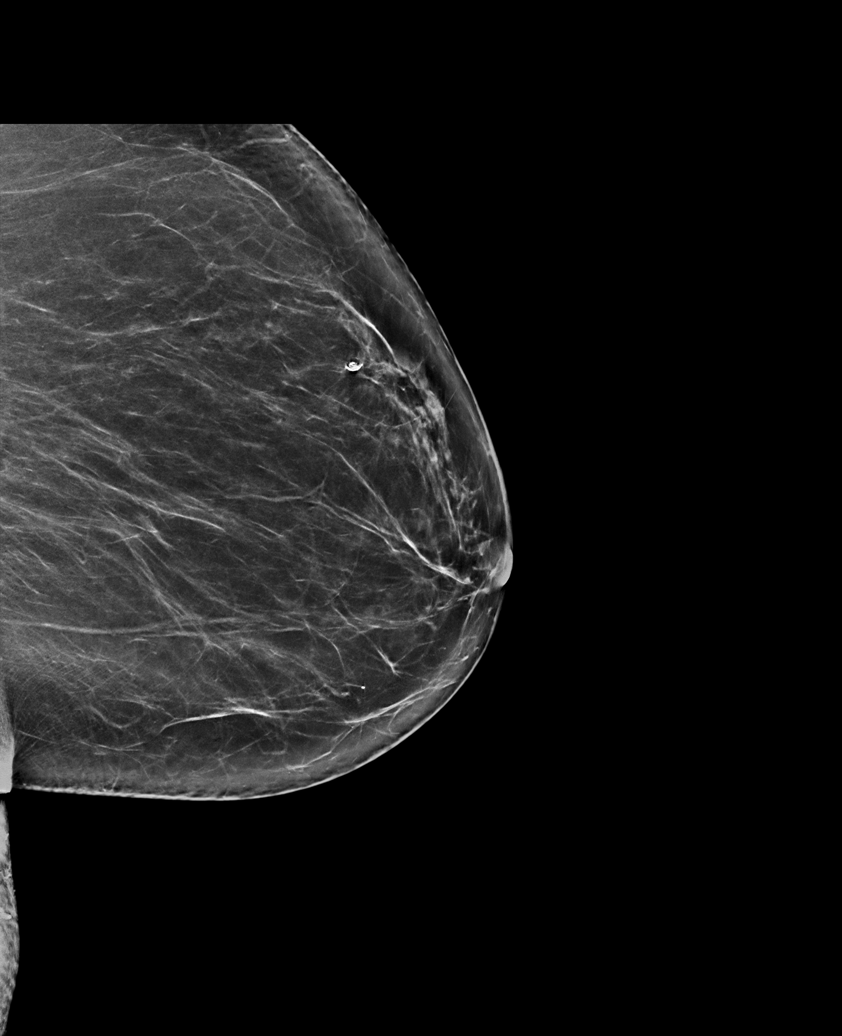

[R CC synth-2D]
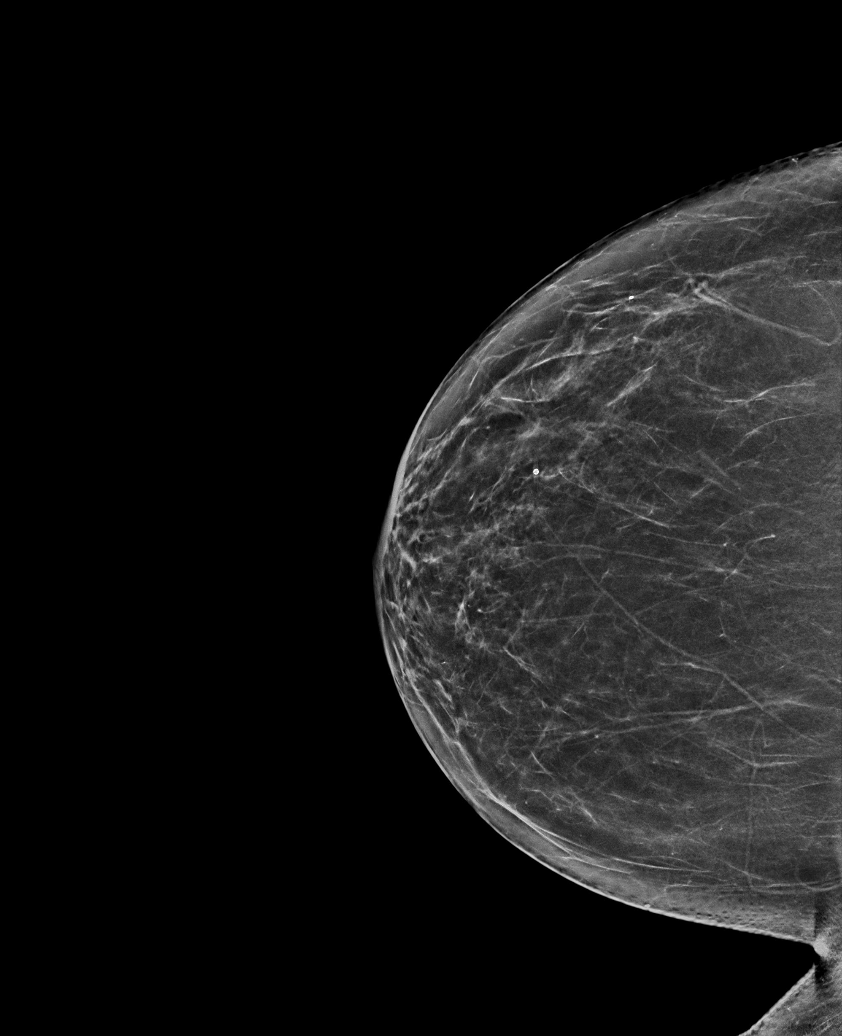

[L MLO synth-2D (2 of 2)]
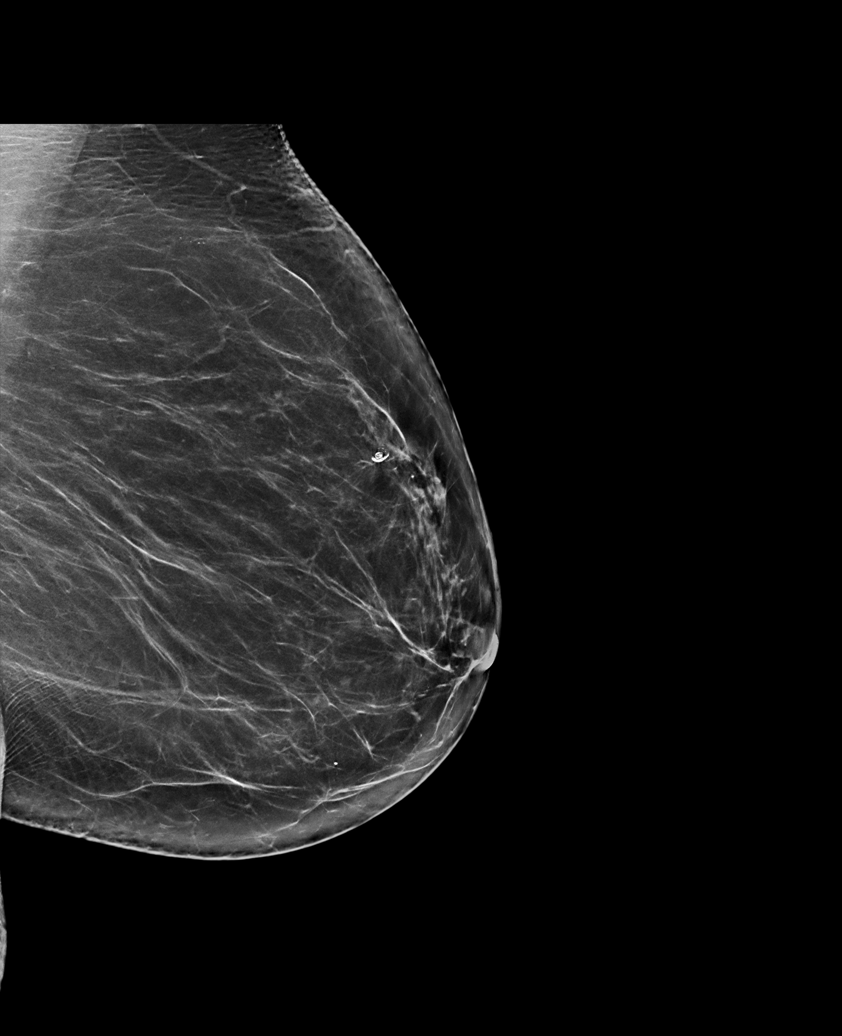

[R MLO tomo · tomo slice 41/80.0]
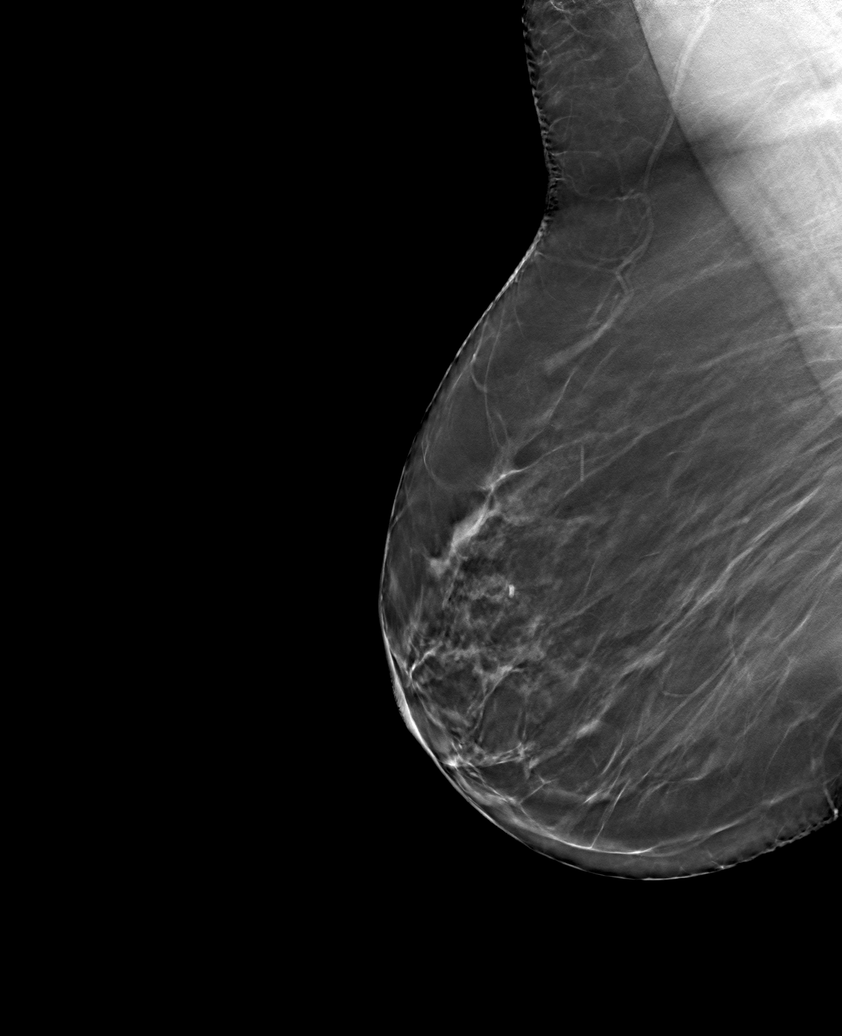

[6 of 30 positions shown; findings below may reference images not displayed]

ACR Breast Density Category b: There are scattered areas of
fibroglandular density.
FINDINGS: There are no findings suspicious for malignancy. Images were
processed with CAD.
IMPRESSION: No mammographic evidence of malignancy. A result letter of this
screening mammogram will be mailed directly to the patient.

RECOMMENDATION:
Screening mammogram in one year. (Code:[TQ])

BI-RADS CATEGORY  1: Negative.

## 2019-09-13 ENCOUNTER — Other Ambulatory Visit: Payer: Self-pay

## 2019-09-13 ENCOUNTER — Encounter: Payer: Self-pay | Admitting: Family Medicine

## 2019-09-13 ENCOUNTER — Ambulatory Visit (INDEPENDENT_AMBULATORY_CARE_PROVIDER_SITE_OTHER): Payer: BLUE CROSS/BLUE SHIELD | Admitting: Family Medicine

## 2019-09-13 VITALS — BP 108/62 | HR 82 | Temp 95.3°F | Ht 66.0 in | Wt 194.1 lb

## 2019-09-13 DIAGNOSIS — K635 Polyp of colon: Secondary | ICD-10-CM | POA: Diagnosis not present

## 2019-09-13 DIAGNOSIS — Z0000 Encounter for general adult medical examination without abnormal findings: Secondary | ICD-10-CM

## 2019-09-13 DIAGNOSIS — Z1211 Encounter for screening for malignant neoplasm of colon: Secondary | ICD-10-CM

## 2019-09-13 DIAGNOSIS — R7989 Other specified abnormal findings of blood chemistry: Secondary | ICD-10-CM

## 2019-09-13 LAB — COMPREHENSIVE METABOLIC PANEL
ALT: 26 U/L (ref 0–35)
AST: 23 U/L (ref 0–37)
Albumin: 4.7 g/dL (ref 3.5–5.2)
Alkaline Phosphatase: 72 U/L (ref 39–117)
BUN: 16 mg/dL (ref 6–23)
CO2: 29 mEq/L (ref 19–32)
Calcium: 9.3 mg/dL (ref 8.4–10.5)
Chloride: 103 mEq/L (ref 96–112)
Creatinine, Ser: 0.79 mg/dL (ref 0.40–1.20)
GFR: 73.06 mL/min (ref >60.00)
Glucose, Bld: 101 mg/dL — ABNORMAL HIGH (ref 70–99)
Potassium: 4.1 mEq/L (ref 3.5–5.1)
Sodium: 138 mEq/L (ref 135–145)
Total Bilirubin: 0.6 mg/dL (ref 0.2–1.2)
Total Protein: 6.9 g/dL (ref 6.0–8.3)

## 2019-09-13 LAB — CBC
HCT: 44 % (ref 36.0–46.0)
Hemoglobin: 14.5 g/dL (ref 12.0–15.0)
MCHC: 33 g/dL (ref 30.0–36.0)
MCV: 86.6 fl (ref 78.0–100.0)
Platelets: 232 10*3/uL (ref 150.0–400.0)
RBC: 5.08 Mil/uL (ref 3.87–5.11)
RDW: 13 % (ref 11.5–15.5)
WBC: 5.6 10*3/uL (ref 4.0–10.5)

## 2019-09-13 LAB — LIPID PANEL
Cholesterol: 176 mg/dL (ref 0–200)
HDL: 41.5 mg/dL
LDL Cholesterol: 98 mg/dL (ref 0–99)
NonHDL: 134.5
Total CHOL/HDL Ratio: 4
Triglycerides: 185 mg/dL — ABNORMAL HIGH (ref 0.0–149.0)
VLDL: 37 mg/dL (ref 0.0–40.0)

## 2019-09-13 LAB — MAGNESIUM: Magnesium: 1.9 mg/dL (ref 1.5–2.5)

## 2019-09-13 NOTE — Progress Notes (Signed)
Chief Complaint  Patient presents with  . Follow-up    6 month  . Leg Pain    left leg cramping at night     Well Woman Elaine Buck is here for a complete physical.   Her last physical was >1 year ago.  Current diet: in general, diet could be better. Current exercise: none . Weight is increasing and she denies fatigue out of ordinary. Seatbelt? Yes  Health Maintenance Pap/HPV- Yes Mammogram- Yes Colon cancer screening-Yes Shingrix- Yes Tetanus- Yes Hep C screening- Yes HIV screening- Yes  Past Medical History:  Diagnosis Date  . Anxiety   . Cancer ([place])    breast (Left)  . History of lobular carcinoma in situ (LCIS) of  left breast    s/p lumpectomy in 2005, Tamoxifen/Evista therapy for 5 years  . Hyperlipidemia   . Thyroid disease      Past Surgical History:  Procedure Laterality Date  . BREAST BIOPSY    . BREAST LUMPECTOMY Left 05/22/2003   LCIS Cells found in breast tissue    Medications  Current Outpatient Medications on File Prior to Visit  Medication Sig Dispense Refill  . ALPRAZolam (XANAX) 0.5 MG tablet Take 1 tablet (0.5 mg total) by mouth daily as needed for anxiety. 30 tablet 5  . atorvastatin (LIPITOR) 10 MG tablet Take 1 tablet (10 mg total) by mouth daily. 90 tablet 3  . chlorhexidine (PERIDEX) 0.12 % solution     . cyclobenzaprine (FLEXERIL) 10 MG tablet Take 1 tablet (10 mg total) by mouth at bedtime. 5 tablet 0  . dexamethasone (DECADRON) 4 MG/ML injection dexamethasone sodium phosphate 4 mg/mL injection solution  Take by injection route.    . diclofenac (VOLTAREN) 75 MG EC tablet Take 1 tablet (75 mg total) by mouth 2 (two) times daily. 20 tablet 0  . HYDROcodone-acetaminophen (NORCO) 5-325 MG tablet Take 1 tablet by mouth every 6 (six) hours as needed for moderate pain. 16 tablet 0  . Influenza Virus Vacc Split PF 0.5 ML SUSY Afluria 2014-2015(PF) 45 mcg (15 mcg x 3)/0.5 mL intramuscular syringe  TO BE ADMINISTERED BY PHARMACIST FOR  IMMUNIZATION    . Influenza Virus Vaccine Split SUSP Afluria 2013-2014 45 mcg (15 mcg x 3)/0.5 mL intramuscular suspension  TO BE ADMINISTERED BY PHARMACIST FOR IMMUNIZATION    . levothyroxine (SYNTHROID) 125 MCG tablet Take 1 tablet (125 mcg total) by mouth daily. 90 tablet 2  . lidocaine (XYLOCAINE) 1 % (with preservative) injection lidocaine HCl 10 mg/mL (1 %) injection solution  Take by injection route.    . triamcinolone cream (KENALOG) 0.1 % Apply 1 application topically 2 (two) times daily. 30 g 0  . venlafaxine XR (EFFEXOR-XR) 150 MG 24 hr capsule Take 1 capsule (150 mg total) by mouth daily with breakfast. 90 capsule 2    Allergies Allergies  Allergen Reactions  . Other Hives and Itching    Peaches    Review of Systems: Constitutional:  no unexpected weight changes Eye:  no recent significant change in vision Ear/Nose/Mouth/Throat:  Ears:  no recent change in hearing Nose/Mouth/Throat:  no complaints of nasal congestion, no sore throat Cardiovascular: no chest pain Respiratory:  no shortness of breath Gastrointestinal:  no abdominal pain, no change in bowel habits GU:  Female: negative for dysuria or pelvic pain Musculoskeletal/Extremities:  +L foot pain that is chronic, +continued back pain; otherwise no pain of the joints Integumentary (Skin/Breast): No new skin lesions Neurologic:  no headaches Endocrine:  denies fatigue  Exam BP 108/62 (BP Location: Left Arm, Patient Position: Sitting, Cuff Size: Normal)   Pulse 82   Temp (!) 95.3 F (35.2 C) (Temporal)   Ht 5\' 6"  (1.676 m)   Wt 194 lb 2 oz (88.1 kg)   SpO2 99%   BMI 31.33 kg/m  General:  well developed, well nourished, in no apparent distress Skin:  no significant moles, warts, or growths Head:  no masses, lesions, or tenderness Eyes:  pupils equal and round, sclera anicteric without injection Ears:  canals without lesions, TMs shiny without retraction, no obvious effusion, no erythema Nose:  nares patent,  septum midline, mucosa normal, and no drainage or sinus tenderness Throat/Pharynx:  lips and gingiva without lesion; tongue and uvula midline; non-inflamed pharynx; no exudates or postnasal drainage Neck: neck supple without adenopathy, thyromegaly, or masses Lungs:  clear to auscultation, breath sounds equal bilaterally, no respiratory distress Cardio:  regular rate and rhythm, no LE edema Abdomen:  abdomen soft, nontender; bowel sounds normal; no masses or organomegaly Genital: Defer to GYN Musculoskeletal:  symmetrical muscle groups noted without atrophy or deformity Extremities:  no clubbing, cyanosis, or edema, no deformities, no skin discoloration Neuro:  gait normal; deep tendon reflexes normal and symmetric Psych: well oriented with normal range of affect and appropriate judgment/insight  Assessment and Plan  Well adult exam - Plan: CBC, Comprehensive metabolic panel, Lipid panel, Magnesium  Screen for colon cancer - Plan: Ambulatory referral to Gastroenterology  Polyp of colon, unspecified part of colon, unspecified type - Plan: Ambulatory referral to Gastroenterology   Well 64 y.o. female. Counseled on diet and exercise. Other orders as above. Pt has f/u w her podiatrist Fri, will let us know if she needs PT for her back.  If labs are neg, will trial muscle relaxer for nighttime use for leg cramping.  Follow up in 6 mo. The patient voiced understanding and agreement to the plan.  [place], DO 09/13/19 10:02 AM

## 2019-09-13 NOTE — Patient Instructions (Addendum)
Give [place] 2-3 business days to get the results of your labs back. If they are normal, we will send in a muscle relaxer.  Keep the diet clean and stay active.   Send me a message on July 1st reminding [place] about the pharmacy change.  Send me a message if you wish to see physical therapy for your back.   Let us know if you need anything.

## 2019-09-14 MED ORDER — BACLOFEN 10 MG PO TABS
10.0000 mg | ORAL_TABLET | Freq: Every day | ORAL | 2 refills | Status: DC
Start: 2019-09-14 — End: 2019-09-19

## 2019-09-15 ENCOUNTER — Encounter: Payer: Self-pay | Admitting: Podiatry

## 2019-09-15 ENCOUNTER — Other Ambulatory Visit: Payer: Self-pay

## 2019-09-15 ENCOUNTER — Telehealth: Payer: Self-pay | Admitting: *Deleted

## 2019-09-15 ENCOUNTER — Ambulatory Visit (INDEPENDENT_AMBULATORY_CARE_PROVIDER_SITE_OTHER): Payer: BLUE CROSS/BLUE SHIELD | Admitting: Podiatry

## 2019-09-15 DIAGNOSIS — M79672 Pain in left foot: Secondary | ICD-10-CM

## 2019-09-15 DIAGNOSIS — Z9889 Other specified postprocedural states: Secondary | ICD-10-CM

## 2019-09-15 DIAGNOSIS — M205X2 Other deformities of toe(s) (acquired), left foot: Secondary | ICD-10-CM

## 2019-09-15 DIAGNOSIS — M779 Enthesopathy, unspecified: Secondary | ICD-10-CM

## 2019-09-15 DIAGNOSIS — M2042 Other hammer toe(s) (acquired), left foot: Secondary | ICD-10-CM

## 2019-09-15 DIAGNOSIS — M21962 Unspecified acquired deformity of left lower leg: Secondary | ICD-10-CM

## 2019-09-15 DIAGNOSIS — G8929 Other chronic pain: Secondary | ICD-10-CM

## 2019-09-15 MED ORDER — DICLOFENAC SODIUM 1 % EX GEL: 2.0000 g | Freq: Four times a day (QID) | CUTANEOUS | 2 refills | Status: DC

## 2019-09-15 NOTE — Telephone Encounter (Signed)
-----   Message from Trula Slade, DPM sent at 09/15/2019 10:00 AM EDT ----- Can you please order PT for [place]? This is to work on gait training, muscle tightness in the leg and work 1st MTPJ ROM. Thanks.

## 2019-09-16 NOTE — Progress Notes (Signed)
Subjective: Elaine Buck is a 64 y.o. is seen today in office s/p left 1st MTPJ arthroplasty with implant, 2nd metatarsal osteotomy with hammertoe repair preformed on 05/03/2019.  States that she is doing about the same.  She gets pain the second toe as well as the ball of her foot on the sesamoids.  Saw her she is describing some cramps in her legs but she did follow-up with her primary care physician.  Not sure if this is coming from the foot.  She also describes that when she sits in her foot is extended as well as her leg to get tingling as well as some numbness to the entire foot.  This is new for her.  It is not just localized to the surgical site of the entire foot.  Denies any recent injury or trauma since I last saw her any other changes. Denies any systemic complaints such as fevers, chills, nausea, vomiting. No calf pain, chest pain, shortness of breath.   Objective: General: No acute distress, AAOx3  DP/PT pulses palpable 2/4, CRT < 3 sec to all digits.  Protective sensation intact. Motor function intact.  LEFT foot: Incision is well coapted without any evidence of dehiscence and scars are formed.  The first MPJ there is adequate dorsiflexion.  Decreased plantarflexion of the first MPJ.  Prominence of the sesamoid complex.  Mild discomfort the second toe the toes are in rectus position.  Mild edema to the second toe as well as submetatarsal 1.  There is no areas of fluctuation.  No other areas of discomfort identified. No pain with calf compression, swelling, warmth, erythema.   Assessment and Plan:  Status post left foot surgery, neuritis  -Treatment options discussed including all alternatives, risks, and complications -Orthotics were dispensed today.  Hopefully this will help her walk more normally take pressure off the sesamoid complex.  I want her to return to physical therapy given the cramps her legs as well as a tightness to her legs.  Even before the surgery she was not as active  given the pain she was having.  He thinks she is becoming more active now and with the tightness in her legs this is causing discomfort.  Also concerned that when she straightens her leg is causing numbness to her foot.  This could be coming from the tightness and nerve issues.  Ultimately if she continued get pain discussed with her first MPJ arthrodesis would like to hold off any further surgery if possible.  Referral to be placed for [facility] med center physical therapy.  Return in about 6 weeks (around 10/27/2019).  [name]

## 2019-09-19 ENCOUNTER — Other Ambulatory Visit (HOSPITAL_COMMUNITY)
Admission: RE | Admit: 2019-09-19 | Discharge: 2019-09-19 | Disposition: A | Discharge status: Home / Self Care | Payer: BLUE CROSS/BLUE SHIELD | Source: Ambulatory Visit | Attending: Family Medicine | Admitting: Family Medicine

## 2019-09-19 ENCOUNTER — Other Ambulatory Visit: Payer: Self-pay

## 2019-09-19 ENCOUNTER — Encounter: Payer: Self-pay | Admitting: Family Medicine

## 2019-09-19 ENCOUNTER — Ambulatory Visit (INDEPENDENT_AMBULATORY_CARE_PROVIDER_SITE_OTHER): Payer: BLUE CROSS/BLUE SHIELD | Admitting: Family Medicine

## 2019-09-19 DIAGNOSIS — Z01419 Encounter for gynecological examination (general) (routine) without abnormal findings: Secondary | ICD-10-CM

## 2019-09-19 DIAGNOSIS — Z124 Encounter for screening for malignant neoplasm of cervix: Secondary | ICD-10-CM | POA: Diagnosis not present

## 2019-09-19 DIAGNOSIS — M19012 Primary osteoarthritis, left shoulder: Secondary | ICD-10-CM | POA: Insufficient documentation

## 2019-09-19 NOTE — Progress Notes (Signed)
  Subjective:     Elaine Buck is a 64 y.o. female and is here for a comprehensive physical exam. The patient reports no problems. Moved here from [place]. Reports h/o abnormal pap due to HPV. Last 2 were normal here. Has kid with mental health disability. Daughter in [place]. 2 grandkids. She is planning to move down after last kid finishes school  The following portions of the patient's history were reviewed and updated as appropriate: allergies, current medications, past family history, past medical history, past social history, past surgical history and problem list.  Review of Systems Pertinent items noted in HPI and remainder of comprehensive ROS otherwise negative.   Objective:    BP 126/75   Pulse 86   Ht 5\' 6"  (1.676 m)   Wt 194 lb (88 kg)   BMI 31.31 kg/m  General appearance: alert, cooperative and appears stated age Head: Normocephalic, without obvious abnormality, atraumatic Neck: no adenopathy, supple, symmetrical, trachea midline and thyroid not enlarged, symmetric, no tenderness/mass/nodules Lungs: clear to auscultation bilaterally Breasts: normal appearance, no masses or tenderness Heart: regular rate and rhythm Abdomen: soft, non-tender; bowel sounds normal; no masses,  no organomegaly Pelvic: cervix normal in appearance, external genitalia normal, no adnexal masses or tenderness, no cervical motion tenderness, uterus normal size, shape, and consistency and vaginal atrophy Extremities: Homans sign is negative, no sign of DVT Pulses: 2+ and symmetric Skin: Skin color, texture, turgor normal. No rashes or lesions Lymph nodes: Cervical, supraclavicular, and axillary nodes normal. Neurologic: Grossly normal    Assessment:    Healthy female exam.      Plan:  PCP does her other HM Screening for malignant neoplasm of cervix - Plan: Cytology - PAP  Encounter for gynecological examination without abnormal finding  Return in 1 year (on 09/18/2020).     See After Visit Summary for Counseling Recommendations

## 2019-09-19 NOTE — Patient Instructions (Signed)
Preventive Care 65-65 Years Old, Female Preventive care refers to visits with your health care provider and lifestyle choices that can promote health and wellness. This includes:  A yearly physical exam. This may also be called an annual well check.  Regular dental visits and eye exams.  Immunizations.  Screening for certain conditions.  Healthy lifestyle choices, such as eating a healthy diet, getting regular exercise, not using drugs or products that contain nicotine and tobacco, and limiting alcohol use. What can I expect for my preventive care visit? Physical exam Your health care provider will check your:  Height and weight. This may be used to calculate body mass index (BMI), which tells if you are at a healthy weight.  Heart rate and blood pressure.  Skin for abnormal spots. Counseling Your health care provider may ask you questions about your:  Alcohol, tobacco, and drug use.  Emotional well-being.  Home and relationship well-being.  Sexual activity.  Eating habits.  Work and work Statistician.  Method of birth control.  Menstrual cycle.  Pregnancy history. What immunizations do I need?  Influenza (flu) vaccine  This is recommended every year. Tetanus, diphtheria, and pertussis (Tdap) vaccine  You may need a Td booster every 10 years. Varicella (chickenpox) vaccine  You may need this if you have not been vaccinated. Zoster (shingles) vaccine  You may need this after age 11. Measles, mumps, and rubella (MMR) vaccine  You may need at least one dose of MMR if you were born in 1957 or later. You may also need a second dose. Pneumococcal conjugate (PCV13) vaccine  You may need this if you have certain conditions and were not previously vaccinated. Pneumococcal polysaccharide (PPSV23) vaccine  You may need one or two doses if you smoke cigarettes or if you have certain conditions. Meningococcal conjugate (MenACWY) vaccine  You may need this if you  have certain conditions. Hepatitis A vaccine  You may need this if you have certain conditions or if you travel or work in places where you may be exposed to hepatitis A. Hepatitis B vaccine  You may need this if you have certain conditions or if you travel or work in places where you may be exposed to hepatitis B. Haemophilus influenzae type b (Hib) vaccine  You may need this if you have certain conditions. Human papillomavirus (HPV) vaccine  If recommended by your health care provider, you may need three doses over 6 months. You may receive vaccines as individual doses or as more than one vaccine together in one shot (combination vaccines). Talk with your health care provider about the risks and benefits of combination vaccines. What tests do I need? Blood tests  Lipid and cholesterol levels. These may be checked every 5 years, or more frequently if you are over 65 years old.  Hepatitis C test.  Hepatitis B test. Screening  Lung cancer screening. You may have this screening every year starting at age 65 if you have a 30-pack-year history of smoking and currently smoke or have quit within the past 15 years.  Colorectal cancer screening. All adults should have this screening starting at age 65 and continuing until age 65. Your health care provider may recommend screening at age 65 if you are at increased risk. You will have tests every 1-10 years, depending on your results and the type of screening test.  Diabetes screening. This is done by checking your blood sugar (glucose) after you have not eaten for a while (fasting). You may have this  done every 1-3 years.  Mammogram. This may be done every 1-2 years. Talk with your health care provider about when you should start having regular mammograms. This may depend on whether you have a family history of breast cancer.  BRCA-related cancer screening. This may be done if you have a family history of breast, ovarian, tubal, or peritoneal  cancers.  Pelvic exam and Pap test. This may be done every 3 years starting at age 65. Starting at age 65, this may be done every 5 years if you have a Pap test in combination with an HPV test. Other tests  Sexually transmitted disease (STD) testing.  Bone density scan. This is done to screen for osteoporosis. You may have this scan if you are at high risk for osteoporosis. Follow these instructions at home: Eating and drinking  Eat a diet that includes fresh fruits and vegetables, whole grains, lean protein, and low-fat dairy.  Take vitamin and mineral supplements as recommended by your health care provider.  Do not drink alcohol if: ? Your health care provider tells you not to drink. ? You are pregnant, may be pregnant, or are planning to become pregnant.  If you drink alcohol: ? Limit how much you have to 0-1 drink a day. ? Be aware of how much alcohol is in your drink. In the [place] one drink equals one 12 oz bottle of beer (355 mL), one 5 oz glass of wine (148 mL), or one 1 oz glass of hard liquor (44 mL). Lifestyle  Take daily care of your teeth and gums.  Stay active. Exercise for at least 30 minutes on 5 or more days each week.  Do not use any products that contain nicotine or tobacco, such as cigarettes, e-cigarettes, and chewing tobacco. If you need help quitting, ask your health care provider.  If you are sexually active, practice safe sex. Use a condom or other form of birth control (contraception) in order to prevent pregnancy and STIs (sexually transmitted infections).  If told by your health care provider, take low-dose aspirin daily starting at age 65. What's next?  Visit your health care provider once a year for a well check visit.  Ask your health care provider how often you should have your eyes and teeth checked.  Stay up to date on all vaccines. This information is not intended to replace advice given to you by your health care provider. Make sure you  discuss any questions you have with your health care provider. Document Revised: 11/18/2017 Document Reviewed: 11/18/2017 Elsevier Patient Education  2020 [employer].

## 2019-09-19 NOTE — Progress Notes (Signed)
Error

## 2019-09-21 LAB — CYTOLOGY - PAP
Comment: NEGATIVE
Diagnosis: NEGATIVE
High risk HPV: NEGATIVE

## 2019-09-25 ENCOUNTER — Encounter: Payer: Self-pay | Admitting: Podiatry

## 2019-09-26 MED ORDER — METHYLPREDNISOLONE 4 MG PO TBPK
ORAL_TABLET | ORAL | 0 refills | Status: DC
Start: 2019-09-26 — End: 2019-10-13

## 2019-09-27 ENCOUNTER — Other Ambulatory Visit: Payer: Self-pay | Admitting: Family Medicine

## 2019-09-27 MED ORDER — GABAPENTIN 300 MG PO CAPS: 300.0000 mg | ORAL_CAPSULE | Freq: Every day | ORAL | 2 refills | Status: DC

## 2019-09-27 MED ORDER — ROPINIROLE HCL 2 MG PO TABS: 1.0000 mg | ORAL_TABLET | Freq: Every day | ORAL | 2 refills | Status: DC

## 2019-09-27 NOTE — Addendum Note (Signed)
Addended by: Ames Coupe on: 09/27/2019 04:28 PM   Modules accepted: Orders

## 2019-10-04 ENCOUNTER — Ambulatory Visit: Payer: Medicare Other | Attending: Podiatry | Admitting: Physical Therapy

## 2019-10-04 ENCOUNTER — Encounter: Payer: Self-pay | Admitting: Physical Therapy

## 2019-10-04 ENCOUNTER — Other Ambulatory Visit: Payer: Self-pay

## 2019-10-04 DIAGNOSIS — M79675 Pain in left toe(s): Secondary | ICD-10-CM | POA: Diagnosis not present

## 2019-10-04 DIAGNOSIS — M25675 Stiffness of left foot, not elsewhere classified: Secondary | ICD-10-CM | POA: Insufficient documentation

## 2019-10-04 DIAGNOSIS — M62838 Other muscle spasm: Secondary | ICD-10-CM | POA: Insufficient documentation

## 2019-10-04 DIAGNOSIS — R2689 Other abnormalities of gait and mobility: Secondary | ICD-10-CM | POA: Diagnosis not present

## 2019-10-04 DIAGNOSIS — R262 Difficulty in walking, not elsewhere classified: Secondary | ICD-10-CM | POA: Diagnosis not present

## 2019-10-04 NOTE — Therapy (Signed)
[facility] [address]  [place], [place], [address] Phone: [phone]   Fax:  [phone]  Physical Therapy Evaluation  Patient Details  Name: Elaine Buck MRN: [id number] Date of Birth: [birth year] Referring Provider (PT): Celesta Gentile, DPM   Encounter Date: 10/04/2019   PT End of Session - 10/04/19 1153    Visit Number 1    Number of Visits 13    Date for PT Re-Evaluation 11/15/19    Authorization Type UHC Medicare    PT Start Time 1053    PT Stop Time 1142    PT Time Calculation (min) 49 min    Activity Tolerance Patient tolerated treatment well;Patient limited by pain    Behavior During Therapy [facility] for tasks assessed/performed           Past Medical History:  Diagnosis Date  . Anxiety   . Cancer ([place])    breast (Left)  . History of lobular carcinoma in situ (LCIS) of  left breast    s/p lumpectomy in 2005, Tamoxifen/Evista therapy for 5 years  . Hyperlipidemia   . Thyroid disease     Past Surgical History:  Procedure Laterality Date  . BREAST BIOPSY    . BREAST LUMPECTOMY Left 05/22/2003   LCIS Cells found in breast tissue  . FOOT SURGERY  04/2019    There were no vitals filed for this visit.    Subjective Assessment - 10/04/19 1055    Subjective Patient reports undergoing L 1st MTPJ arthroplasty and 2nd hammertoe repair in February 2021. Had PT at [facility] in April and May with good recovery. Starting in June, she started to experience pain over the plantar surface of the 1st MTP with walking. Toes feel crushed in her shoes and has numbness in her toes. Denies balance difficulties but notes some LBP as her "gait is off." Wakes up during the night with intense cramps in the calf or quad but can also simulate this cramping by pointing her toes up or down. All imaging showed normal healing but is scheduled for an MRI. Using an orthotic given to her by MD with little benefit.    Pertinent  History thyroid disease, HLD, hx breast CA with lumpectomy 2005, anxiety    Limitations Standing;Walking;House hold activities    How long can you sit comfortably? unlimited if LEs are dependent    How long can you stand comfortably? 5 min    How long can you walk comfortably? 2 min    Diagnostic tests 08/04/19 L foot xray: bony resorptive changes at the second PIP joint whichmay be postsurgical/posttraumatic, or due to infection. Stable appearance of the first MTPJ arthroplasty    Patient Stated Goals "walk without pain and wear shoes without pain"    Currently in Pain? Yes    Pain Score 0-No pain    Pain Location Toe (Comment which one)   L toes   Pain Orientation Left    Pain Descriptors / Indicators Aching    Pain Type Chronic pain              [facility] PT Assessment - 10/04/19 1105      Assessment   Medical Diagnosis Hallux limitus of L foot, Hammer toe of L foot, L chronic foot pain, Capsulitis, L metatarsal deformity, s/p L foot surgery    Referring Provider (PT) Celesta Gentile, DPM    Onset Date/Surgical Date 05/03/19    Next MD Visit 10/27/19  Prior Therapy yes- s/p foot surgery      Precautions   Precautions None      Balance Screen   Has the patient fallen in the past 6 months No    Has the patient had a decrease in activity level because of a fear of falling?  No    Is the patient reluctant to leave their home because of a fear of falling?  No      Home Ecologist residence    Living Arrangements Spouse/significant other    Available Help at Discharge Family    Type of [place] to enter    Entrance Stairs-Number of Steps 3    [place] One level    [place] None      Prior Function   Level of [place] Retired    Leisure gardening      Cognition   Overall Cognitive Status Within Functional Limits for tasks assessed       Observation/Other Assessments   Observations healed longitudinal incisions over L dorsal 1st and 2nd digits      Sensation   Light Touch Impaired by gross assessment   decreased over L foot     Coordination   Gross Motor Movements are Fluid and Coordinated Yes      Posture/Postural Control   Posture/Postural Control Postural limitations    Postural Limitations Rounded Shoulders    Posture Comments 1st great toe doesn't make contact with floor in standing, slightly higher medial arch L vs. R foot      ROM / Strength   AROM / PROM / Strength AROM;Strength;PROM      AROM   AROM Assessment Site Ankle    Right/Left Ankle Left;Right    Right Ankle Dorsiflexion 14    Right Ankle Plantar Flexion 40    Left Ankle Dorsiflexion 18   c/o severe cramp in calf   Left Ankle Plantar Flexion 33   severe cramp plantar 1st digit     PROM   Overall PROM Comments L 1st digit flexion 7 deg, extension 25 deg    PROM Assessment Site Ankle    Right/Left Ankle Left      Strength   Overall Strength Comments L great toe flexion 3+/5, extension 3+/5    Strength Assessment Site Hip;Knee;Ankle    Right/Left Hip Right;Left    Right Hip Flexion 5/5    Right Hip ABduction 4+/5    Right Hip ADduction 4+/5    Left Hip Flexion 4+/5    Left Hip ABduction 4+/5    Left Hip ADduction 4+/5    Right/Left Knee Right;Left    Right Knee Flexion 5/5    Right Knee Extension 5/5    Left Knee Flexion 4+/5    Left Knee Extension 4/5    Right/Left Ankle Right;Left    Right Ankle Dorsiflexion 4+/5    Right Ankle Plantar Flexion 4+/5   12 reps   Right Ankle Inversion 4+/5    Right Ankle Eversion 4+/5    Left Ankle Dorsiflexion 4/5    Left Ankle Plantar Flexion 3-/5   able to do 1 rep with limited ROM   Left Ankle Inversion 4+/5    Left Ankle Eversion 4+/5      Palpation   Palpation comment increased soft tissue restriction in B gastroc/soleus complex with increased tenderness L vs. R  Ambulation/Gait   Gait  Pattern Step-through pattern;Decreased stance time - left;Decreased step length - right;Decreased weight shift to left    Ambulation Surface Level;Indoor    Gait velocity WFL                      Objective measurements completed on examination: See above findings.               PT Education - 10/04/19 1153    Education Details prognosis, POC, HEP    Person(s) Educated Patient    Methods Explanation;Demonstration;Tactile cues;Verbal cues;Handout    Comprehension Verbalized understanding;Returned demonstration            PT Short Term Goals - 10/04/19 1200      PT SHORT TERM GOAL #1   Title Patient to be independent with initial HEP.    Time 3    Period Weeks    Status New    Target Date 10/25/19             PT Long Term Goals - 10/04/19 1200      PT LONG TERM GOAL #1   Title Patient to be independent with advanced HEP.    Time 6    Period Weeks    Status New    Target Date 11/15/19      PT LONG TERM GOAL #2   Title Patient to demonstrate 15 degree improvement in L great toe flexion/extension PROM.    Baseline L 1st digit flexion 7 deg, extension 25 deg    Time 6    Period Weeks    Status New    Target Date 11/15/19      PT LONG TERM GOAL #3   Title Patient to demonstrate B LE strength >/=4+/5.    Time 6    Period Weeks    Status New    Target Date 11/15/19      PT LONG TERM GOAL #4   Title Patient to report tolerance for 30 minutes of standing/walking without pain limiting.    Time 6    Period Weeks    Status New    Target Date 11/15/19      PT LONG TERM GOAL #5   Title Patient to report 70% improvement in frequency of L LE cramping.    Time 6    Period Weeks    Status New    Target Date 11/15/19                  Plan - 10/04/19 1154    Clinical Impression Statement Patient is a 64y/o F presenting to OPPT with c/o L 1 digit toe pain s/p L MTP arthroplasty and 2nd metatarsal osteotomy with hammertoe repair  preformed on 05/03/2019. Notes that she had a good recovery with PT in April and May, but pain began in June. Now struggling with pain over the plantar 1st MTP joint when walking, intense cramping in L calf and quads at night and with ankle movement, and discomfort wearing shoes. Patient today presenting with poor 1st digit contact with floor in standing, limited and painful L ankle and great toe ROM, decreased L LE strength, increased soft tissue restriction in B gastroc/soleus complex with increased tenderness L vs. R, and gait deviations. Patient was educated on gentle stretching and strengthening HEP and reported understanding. Would benefit from skilled PT services 2x/week for 6 weeks to address aforementioned impairments.    Personal Factors and Comorbidities Age;Sex;Comorbidity 3+;Past/Current Experience;Time  since onset of injury/illness/exacerbation    Comorbidities thyroid disease, HLD, hx breast CA with lumpectomy 2005, anxiety    Examination-Activity Limitations Bend;Squat;Stairs;Carry;Stand;Dressing;Transfers;Hygiene/Grooming;Lift;Locomotion Level    Examination-Participation Restrictions Church;Cleaning;Shop;Community Activity;Yard Work;Laundry;Meal Prep    Stability/Clinical Decision Making Stable/Uncomplicated    Clinical Decision Making Low    Rehab Potential Good    PT Frequency 2x / week    PT Duration 6 weeks    PT Treatment/Interventions ADLs/Self Care Home Management;Cryotherapy;Electrical Stimulation;Moist Heat;Balance training;Therapeutic exercise;Therapeutic activities;Functional mobility training;Stair training;Gait training;Ultrasound;Neuromuscular re-education;Patient/family education;Manual techniques;Vasopneumatic Device;Taping;Energy conservation;Dry needling;Passive range of motion;Scar mobilization    PT Next Visit Plan foot FOTO, reassess HEP, STM to L calf, progress L ankle and great toe mobility    Consulted and Agree with Plan of Care Patient           Patient  will benefit from skilled therapeutic intervention in order to improve the following deficits and impairments:  Abnormal gait, Hypomobility, Increased edema, Decreased scar mobility, Decreased activity tolerance, Decreased strength, Pain, Increased fascial restricitons, Decreased balance, Difficulty walking, Increased muscle spasms, Improper body mechanics, Decreased range of motion, Impaired flexibility, Postural dysfunction  Visit Diagnosis: Pain in left toe(s)  Stiffness of left foot, not elsewhere classified  Other muscle spasm  Difficulty in walking, not elsewhere classified  Other abnormalities of gait and mobility     Problem List Patient Active Problem List   Diagnosis Date Noted  . Arthritis of left acromioclavicular joint 09/19/2019  . Metatarsal deformity, left 05/19/2019  . Hallux limitus of left foot 04/11/2019  . Hammer toe of left foot 04/11/2019  . Low back pain 04/10/2019  . Numbness of hand 04/10/2019  . Shoulder pain 04/10/2019  . History of lobular carcinoma in situ (LCIS) of  left breast   . Anxiety 07/02/2017  . Hypothyroidism 07/02/2017  . Hyperlipidemia 07/02/2017  . Vitamin D deficiency 04/09/2016  . Osteopenia 03/05/2014  . Carpal tunnel syndrome 11/01/2013  . Depressive disorder 01/21/2010  . Polyp of colon 03/26/2009  . Malignant tumor of breast ([place]) 01/19/2008     Janene Harvey, PT, DPT 10/04/19 12:05 PM   [facility] [address]  [address] [place], [place], [address] Phone: [phone]   Fax:  [phone]  Name: Elaine Buck MRN: [id number] Date of Birth: 1955/07/09

## 2019-10-05 ENCOUNTER — Telehealth: Payer: Self-pay | Admitting: *Deleted

## 2019-10-05 DIAGNOSIS — M79672 Pain in left foot: Secondary | ICD-10-CM

## 2019-10-05 DIAGNOSIS — M205X2 Other deformities of toe(s) (acquired), left foot: Secondary | ICD-10-CM

## 2019-10-05 DIAGNOSIS — G8929 Other chronic pain: Secondary | ICD-10-CM

## 2019-10-05 DIAGNOSIS — M779 Enthesopathy, unspecified: Secondary | ICD-10-CM

## 2019-10-05 DIAGNOSIS — M21962 Unspecified acquired deformity of left lower leg: Secondary | ICD-10-CM

## 2019-10-05 NOTE — Telephone Encounter (Signed)
-----   Message from Trula Slade, DPM sent at 10/05/2019 10:42 AM EDT ----- Can you please order an MRI of the left foot without contrast given ongoing submet 1 pain, rule out flexor tear. Thanks   She would like this done at [facility]

## 2019-10-05 NOTE — Telephone Encounter (Signed)
Orders to L. Cox, CMA for pre-cert.

## 2019-10-06 ENCOUNTER — Telehealth: Payer: Self-pay | Admitting: *Deleted

## 2019-10-06 NOTE — Telephone Encounter (Signed)
Called and spoke with Manus Rudd at [facility] does not require prior authorization for the procedure code [id number] and the reference number is [id number]

## 2019-10-07 ENCOUNTER — Ambulatory Visit (INDEPENDENT_AMBULATORY_CARE_PROVIDER_SITE_OTHER): Payer: Medicare Other

## 2019-10-07 ENCOUNTER — Other Ambulatory Visit: Payer: Self-pay

## 2019-10-07 DIAGNOSIS — M21962 Unspecified acquired deformity of left lower leg: Secondary | ICD-10-CM | POA: Diagnosis not present

## 2019-10-07 DIAGNOSIS — M62572 Muscle wasting and atrophy, not elsewhere classified, left ankle and foot: Secondary | ICD-10-CM | POA: Diagnosis not present

## 2019-10-07 DIAGNOSIS — M79672 Pain in left foot: Secondary | ICD-10-CM

## 2019-10-07 DIAGNOSIS — G8929 Other chronic pain: Secondary | ICD-10-CM

## 2019-10-07 DIAGNOSIS — M779 Enthesopathy, unspecified: Secondary | ICD-10-CM

## 2019-10-07 DIAGNOSIS — M205X2 Other deformities of toe(s) (acquired), left foot: Secondary | ICD-10-CM

## 2019-10-07 DIAGNOSIS — M19072 Primary osteoarthritis, left ankle and foot: Secondary | ICD-10-CM | POA: Diagnosis not present

## 2019-10-07 IMAGING — MR MR FOOT*L* W/O CM
5 series · 40 of 40 positions shown · non-contrast
Comparison: Radiographs [DATE]

CLINICAL DATA: Chronic left foot pain

EXAM:
MRI OF THE LEFT FOREFOOT WITHOUT CONTRAST
TECHNIQUE: Multiplanar, multisequence MR imaging of the left forefoot was
performed. No intravenous contrast was administered.

[Series 5: T1 · coronal · 3.0mm · 0.53mm/px · 10 of 45 slices shown (1 of 2)]
[im 1/45]
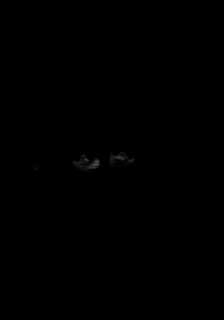
[im 5/45]
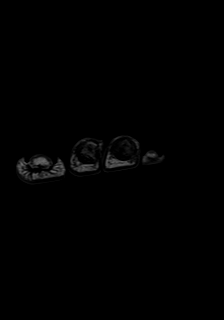
[im 10/45]
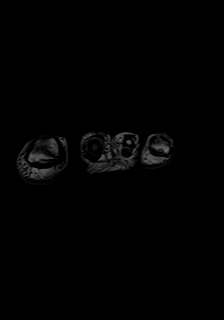
[im 15/45]
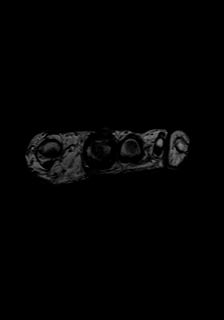
[im 20/45]
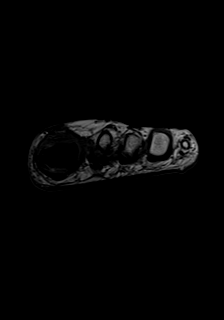
[im 25/45]
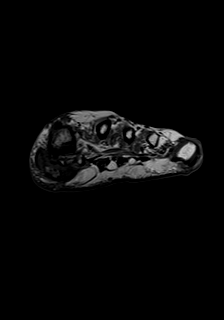
[im 30/45]
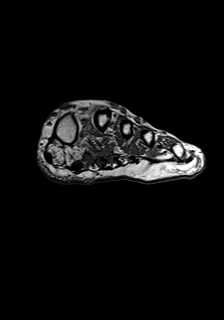
[im 35/45]
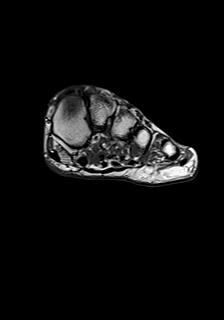
[im 40/45]
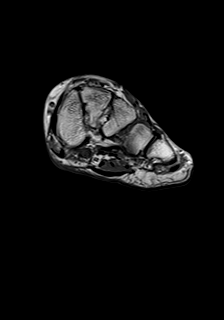
[im 45/45]
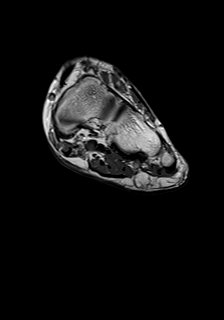

[Series 6: T2 fat-sat · coronal · 3.0mm · 0.53mm/px · 11 of 45 slices shown (1 of 2)]
[im 1/45]
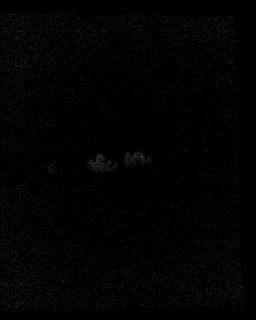
[im 5/45]
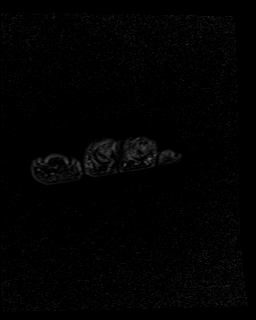
[im 9/45]
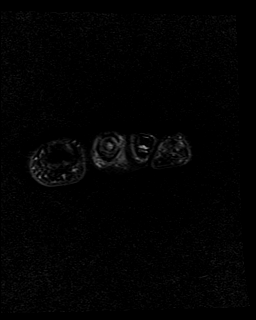
[im 14/45]
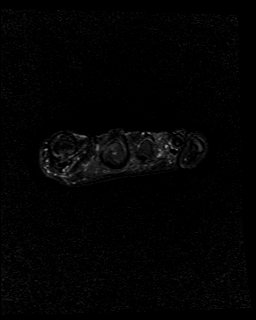
[im 18/45]
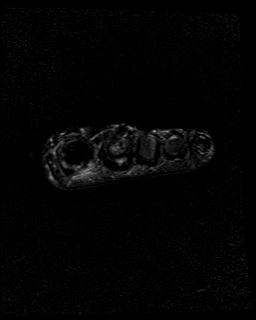
[im 23/45]
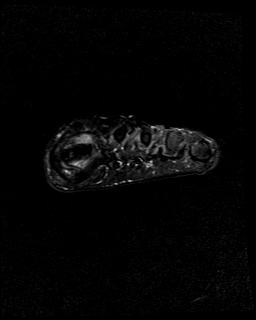
[im 27/45]
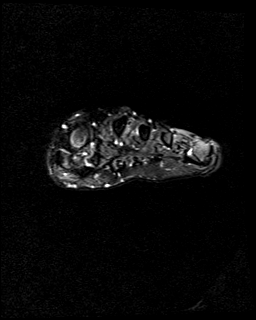
[im 31/45]
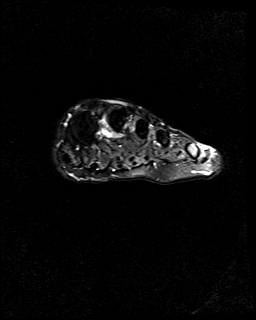
[im 36/45]
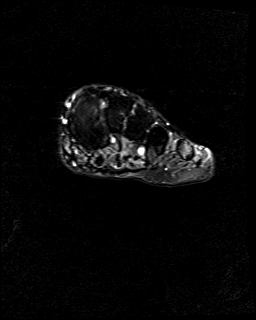
[im 40/45]
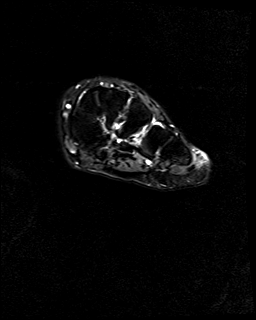
[im 45/45]
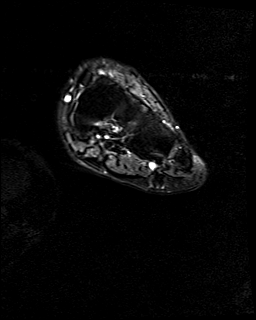

[Series 7: T1 · axial · 3.0mm · 0.53mm/px · z∈[-134,-67]mm · 6 of 23 slices shown (2 of 2)]
[im 1/23]
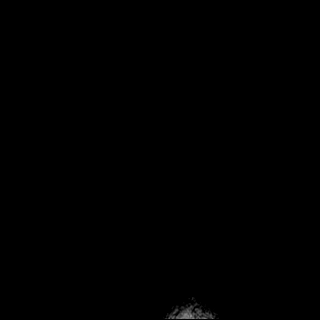
[im 5/23]
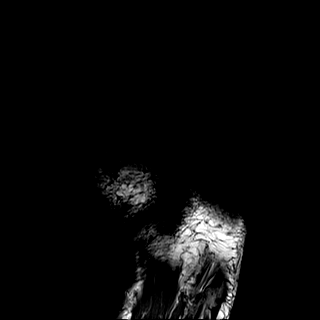
[im 9/23]
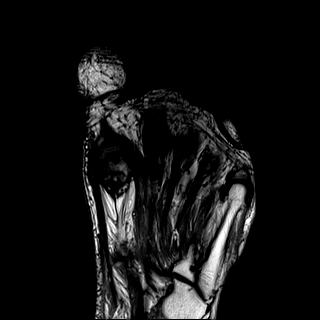
[im 14/23]
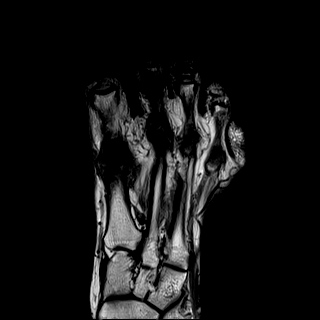
[im 18/23]
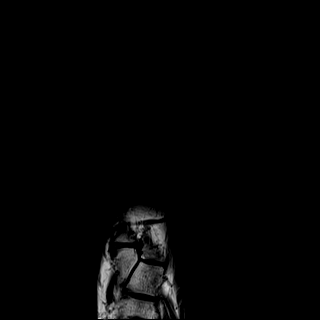
[im 23/23]
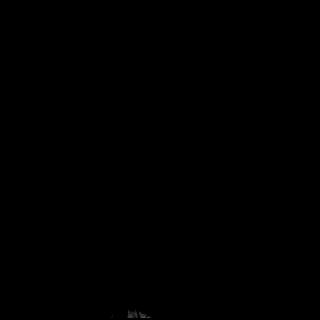

[Series 8: T2 fat-sat · axial · 3.0mm · 0.66mm/px · z∈[-134,-67]mm · 6 of 23 slices shown (2 of 2)]
[im 1/23]
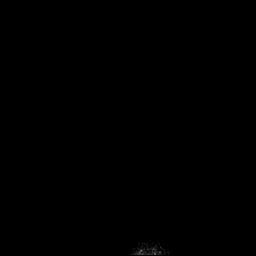
[im 5/23]
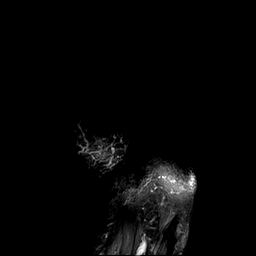
[im 9/23]
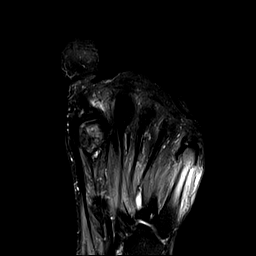
[im 14/23]
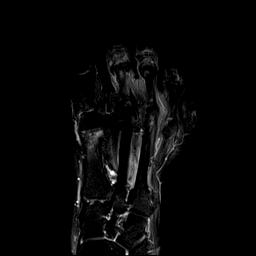
[im 18/23]
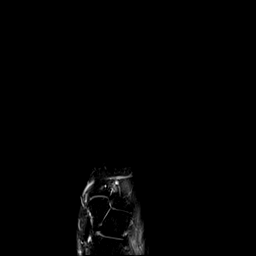
[im 23/23]
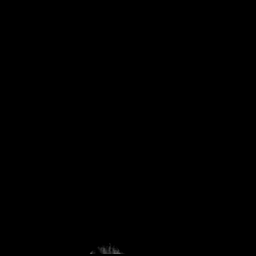

[Series 9: STIR · sagittal · 3.0mm · 0.70mm/px · 7 of 30 slices shown]
[im 1/30]
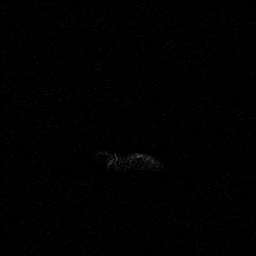
[im 5/30]
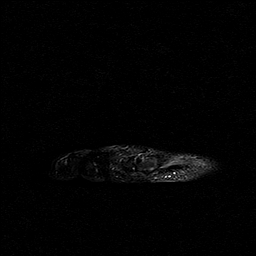
[im 10/30]
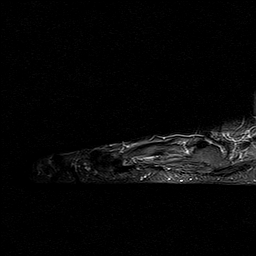
[im 15/30]
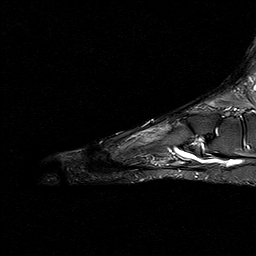
[im 20/30]
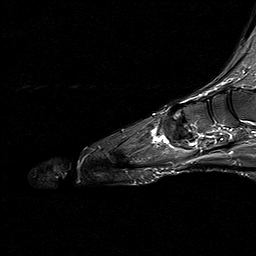
[im 25/30]
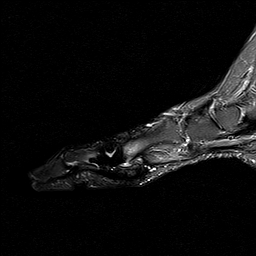
[im 30/30]
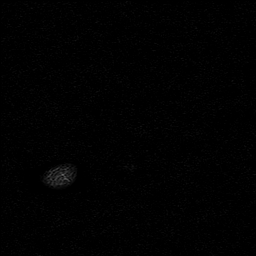

[40 of 40 positions shown; findings below may reference images not displayed]

FINDINGS: Bones/Joint/Cartilage

First MTP joint arthroplasty with marrow edema in the distal shaft
of the first metacarpal and to a lesser extent in the proximal
phalanx of the great toe.

There is marrow edema in the medial sesamoid of the first digit as
shown on image [DATE]. The lateral sesamoid is not well seen.

There is a small screw in the head of the second metatarsal,
traversing a prior osteotomy site presumably with hammertoe
correction mild notching/irregularity of the distal articular
surface of the second metatarsal head centrally on image [DATE]
favoring a small osteochondral lesion. There is low-grade marrow
edema in the head of the second metatarsal.

Suspected osteotomy of the head of the proximal phalanx of the
second toe, with marrow edema in the shaft of the proximal phalanx
and also throughout the middle phalanx.

There is mild diffuse marrow edema in the middle phalanx and distal
phalanx of the third toe as shown on image [DATE], with spurring and
some arthropathy at the distal interphalangeal joint.

Aside from degenerative spurring, no additional arthropathy at the
interphalangeal joints of the fourth and fifth toes.

Ligaments

The Lisfranc ligament appears intact.

Muscles and Tendons

No flexor tendon discontinuity is identified. There is thickening of
the abductor hallucis and flexor hallucis brevis medial head tendons
for example on images 14-15 of series 8 there is also a suggestion
of fatty atrophy involving both the abductor hallucis and flexor
hallucis brevis muscles for example on image [DATE].

Soft tissues

No drainable fluid collection. No significant confluent subcutaneous
edema.
IMPRESSION: 1. First MTP joint arthroplasty with marrow edema in the distal
shaft of the first metacarpal and to a lesser extent in the proximal
phalanx of the great toe. As best I can tell from prior radiographs
the patient's first digit surgery was around [DATE], and the
degree of current marrow edema seems mildly abnormal. Also along the
first digit there is thickening of the abductor hallucis and flexor
hallucis brevis medial head tendons indicating tendinopathy, edema
signal in the medial sesamoid, and resorption or resection of the
lateral sesamoid.
2. Osteotomy/partial resection of the head of the proximal phalanx
of the second toe, with nonspecific marrow edema in the shaft of the
proximal phalanx and also throughout the middle phalanx.
3. Suspected osteotomy of the head of the second metatarsal, with
low-grade marrow edema in the head of the second metatarsal.
4. There is mild diffuse marrow edema in the middle phalanx and
distal phalanx of the third toe, with spurring and some arthropathy
at the distal interphalangeal joint. This edema is probably
secondary to arthropathy.
5. Fatty atrophy involving both the abductor hallucis and flexor
hallucis brevis muscles.

## 2019-10-09 ENCOUNTER — Other Ambulatory Visit: Payer: Self-pay

## 2019-10-09 ENCOUNTER — Ambulatory Visit: Payer: Medicare Other

## 2019-10-09 DIAGNOSIS — M62838 Other muscle spasm: Secondary | ICD-10-CM | POA: Diagnosis not present

## 2019-10-09 DIAGNOSIS — M25675 Stiffness of left foot, not elsewhere classified: Secondary | ICD-10-CM | POA: Diagnosis not present

## 2019-10-09 DIAGNOSIS — R262 Difficulty in walking, not elsewhere classified: Secondary | ICD-10-CM

## 2019-10-09 DIAGNOSIS — R2689 Other abnormalities of gait and mobility: Secondary | ICD-10-CM

## 2019-10-09 DIAGNOSIS — M79675 Pain in left toe(s): Secondary | ICD-10-CM | POA: Diagnosis not present

## 2019-10-09 NOTE — Therapy (Signed)
[facility] [address]  [place], [place], [address] Phone: [phone]   Fax:  [phone]  Physical Therapy Treatment  Patient Details  Name: Elaine Buck MRN: [id number] Date of Birth: June 26, 1955 Referring Provider (PT): Celesta Gentile, DPM   Encounter Date: 10/09/2019   PT End of Session - 10/09/19 1026    Visit Number 2    Number of Visits 13    Date for PT Re-Evaluation 11/15/19    Authorization Type [employer] Medicare    PT Start Time 1019    PT Stop Time 1102    PT Time Calculation (min) 43 min    Activity Tolerance Patient tolerated treatment well;Patient limited by pain    Behavior During Therapy [facility] for tasks assessed/performed           Past Medical History:  Diagnosis Date  . Anxiety   . Cancer ([place])    breast (Left)  . History of lobular carcinoma in situ (LCIS) of  left breast    s/p lumpectomy in 2005, Tamoxifen/Evista therapy for 5 years  . Hyperlipidemia   . Thyroid disease     Past Surgical History:  Procedure Laterality Date  . BREAST BIOPSY    . BREAST LUMPECTOMY Left 05/22/2003   LCIS Cells found in breast tissue  . FOOT SURGERY  04/2019    There were no vitals filed for this visit.   Subjective Assessment - 10/09/19 1023    Subjective Pt. noting some issues with sitting ankle stretch.    Pertinent History thyroid disease, HLD, hx breast CA with lumpectomy 2005, anxiety    Diagnostic tests 08/04/19 L foot xray: bony resorptive changes at the second PIP joint whichmay be postsurgical/posttraumatic, or due to infection. Stable appearance of the first MTPJ arthroplasty    Patient Stated Goals "walk without pain and wear shoes without pain"    Currently in Pain? No/denies    Pain Score 0-No pain   4/10 pain at most when walking   Pain Location --   L toes   Pain Orientation Left    Pain Descriptors / Indicators Aching   some numbness when laying supine or legs crossed                              [facility] Adult PT Treatment/Exercise - 10/09/19 0001      Manual Therapy   Manual Therapy Soft tissue mobilization;Myofascial release    Manual therapy comments prone     Soft tissue mobilization STM/DTM to L gastroc focusing on medial L gastroc     Myofascial Release TPR to L medial proximal gastroc - TPs throughout calf       Ankle Exercises: Stretches   Plantar Fascia Stretch 2 reps;30 seconds    Plantar Fascia Stretch Limitations L only - gentle on wall     Gastroc Stretch 2 reps;30 seconds    Gastroc Stretch Limitations B - focusing on L    Other Stretch L anterior tibialis stretch sitting and standing using counter for support x 30 sec each    pt. preferred standing version thus HEP updated    Other Stretch Sitting L fascia/great toe extension stretch x 30 sec       Ankle Exercises: Standing   Heel Raises 10 reps;Both   painful at end ROM   Heel Raises Limitations at window seal     Toe Raise 10 reps;3 seconds  Toe Raise Limitations at window seal                  PT Education - 10/09/19 1216    Education Details HEP update; great toe extension/fascia stretch with foot on wall, standing tib anterior stretch    Person(s) Educated Patient    Methods Explanation;Demonstration;Verbal cues;Handout    Comprehension Verbalized understanding;Returned demonstration;Verbal cues required            PT Short Term Goals - 10/09/19 1026      PT SHORT TERM GOAL #1   Title Patient to be independent with initial HEP.    Time 3    Period Weeks    Status On-going    Target Date 10/25/19             PT Long Term Goals - 10/09/19 1027      PT LONG TERM GOAL #1   Title Patient to be independent with advanced HEP.    Time 6    Period Weeks    Status On-going      PT LONG TERM GOAL #2   Title Patient to demonstrate 15 degree improvement in L great toe flexion/extension PROM.    Baseline L 1st digit flexion 7 deg, extension 25 deg     Time 6    Period Weeks    Status On-going      PT LONG TERM GOAL #3   Title Patient to demonstrate B LE strength >/=4+/5.    Time 6    Period Weeks    Status On-going      PT LONG TERM GOAL #4   Title Patient to report tolerance for 30 minutes of standing/walking without pain limiting.    Time 6    Period Weeks    Status On-going      PT LONG TERM GOAL #5   Title Patient to report 70% improvement in frequency of L LE cramping.    Time 6    Period Weeks    Status On-going                 Plan - 10/09/19 1026    Clinical Impression Statement Irem doing well.  Does complaint of L foot pain when walking and visibly limited step length on R likely due to limited ankle/foot mobility on L.  HEP review revealing pt. painful with toe raise, runner calf stretch into wall which was relieved with rest.  Was unable to perform sitting anterior tibialis stretch as she was unable to maintain positioning thus provided more tolerable stretch in standing which pt. verbalized worked for her.  HEP updated (see pt. education section).    Comorbidities thyroid disease, HLD, hx breast CA with lumpectomy 2005, anxiety    Rehab Potential Good    PT Treatment/Interventions ADLs/Self Care Home Management;Cryotherapy;Electrical Stimulation;Moist Heat;Balance training;Therapeutic exercise;Therapeutic activities;Functional mobility training;Stair training;Gait training;Ultrasound;Neuromuscular re-education;Patient/family education;Manual techniques;Vasopneumatic Device;Taping;Energy conservation;Dry needling;Passive range of motion;Scar mobilization    PT Next Visit Plan foot FOTO, STM to L calf, progress L ankle and great toe mobility    Consulted and Agree with Plan of Care Patient           Patient will benefit from skilled therapeutic intervention in order to improve the following deficits and impairments:  Abnormal gait, Hypomobility, Increased edema, Decreased scar mobility, Decreased activity  tolerance, Decreased strength, Pain, Increased fascial restricitons, Decreased balance, Difficulty walking, Increased muscle spasms, Improper body mechanics, Decreased range of motion, Impaired flexibility, Postural dysfunction  Visit Diagnosis: Pain in left toe(s)  Stiffness of left foot, not elsewhere classified  Other muscle spasm  Difficulty in walking, not elsewhere classified  Other abnormalities of gait and mobility     Problem List Patient Active Problem List   Diagnosis Date Noted  . Arthritis of left acromioclavicular joint 09/19/2019  . Metatarsal deformity, left 05/19/2019  . Hallux limitus of left foot 04/11/2019  . Hammer toe of left foot 04/11/2019  . Low back pain 04/10/2019  . Numbness of hand 04/10/2019  . Shoulder pain 04/10/2019  . History of lobular carcinoma in situ (LCIS) of  left breast   . Anxiety 07/02/2017  . Hypothyroidism 07/02/2017  . Hyperlipidemia 07/02/2017  . Vitamin D deficiency 04/09/2016  . Osteopenia 03/05/2014  . Carpal tunnel syndrome 11/01/2013  . Depressive disorder 01/21/2010  . Polyp of colon 03/26/2009  . Malignant tumor of breast [facility]) 01/19/2008    Bess Harvest, PTA 10/09/19 12:17 PM   [facility] [address]  [place], [place], [address] Phone: [phone]   Fax:  [phone]  Name: Bridgett Hattabaugh MRN: [id number] Date of Birth: 17-Jan-1956

## 2019-10-12 ENCOUNTER — Encounter: Payer: Self-pay | Admitting: Podiatry

## 2019-10-13 ENCOUNTER — Ambulatory Visit: Payer: Medicare Other | Admitting: Physical Therapy

## 2019-10-13 ENCOUNTER — Other Ambulatory Visit: Payer: Self-pay

## 2019-10-13 ENCOUNTER — Encounter: Payer: Self-pay | Admitting: Physical Therapy

## 2019-10-13 ENCOUNTER — Ambulatory Visit: Payer: Medicare Other | Admitting: Podiatry

## 2019-10-13 ENCOUNTER — Encounter: Payer: Self-pay | Admitting: Podiatry

## 2019-10-13 VITALS — Temp 97.3°F

## 2019-10-13 DIAGNOSIS — M79675 Pain in left toe(s): Secondary | ICD-10-CM

## 2019-10-13 DIAGNOSIS — M25675 Stiffness of left foot, not elsewhere classified: Secondary | ICD-10-CM

## 2019-10-13 DIAGNOSIS — M62838 Other muscle spasm: Secondary | ICD-10-CM

## 2019-10-13 DIAGNOSIS — M779 Enthesopathy, unspecified: Secondary | ICD-10-CM | POA: Diagnosis not present

## 2019-10-13 DIAGNOSIS — R262 Difficulty in walking, not elsewhere classified: Secondary | ICD-10-CM | POA: Diagnosis not present

## 2019-10-13 DIAGNOSIS — R2689 Other abnormalities of gait and mobility: Secondary | ICD-10-CM

## 2019-10-13 DIAGNOSIS — M205X2 Other deformities of toe(s) (acquired), left foot: Secondary | ICD-10-CM

## 2019-10-13 DIAGNOSIS — M2580 Other specified joint disorders, unspecified joint: Secondary | ICD-10-CM

## 2019-10-13 NOTE — Therapy (Signed)
[facility] [address]  [place], [place], [address] Phone: [phone]   Fax:  [phone]  Physical Therapy Treatment  Patient Details  Name: Elaine Buck MRN: [id number] Date of Birth: [birth year] Referring Provider (PT): Celesta Gentile, DPM   Encounter Date: 10/13/2019   PT End of Session - 10/13/19 1104    Visit Number 3    Number of Visits 13    Date for PT Re-Evaluation 11/15/19    Authorization Type [employer]HC Medicare    PT Start Time 0928    PT Stop Time 1013    PT Time Calculation (min) 45 min    Activity Tolerance Patient tolerated treatment well;Patient limited by pain    Behavior During Therapy [facility] for tasks assessed/performed           Past Medical History:  Diagnosis Date  . Anxiety   . Cancer ([place])    breast (Left)  . History of lobular carcinoma in situ (LCIS) of  left breast    s/p lumpectomy in 2005, Tamoxifen/Evista therapy for 5 years  . Hyperlipidemia   . Thyroid disease     Past Surgical History:  Procedure Laterality Date  . BREAST BIOPSY    . BREAST LUMPECTOMY Left 05/22/2003   LCIS Cells found in breast tissue  . FOOT SURGERY  04/2019    There were no vitals filed for this visit.   Subjective Assessment - 10/13/19 0929    Subjective Got a severe L HS cramp on Wednesday night that she still feels when she walks. HEP going well- no questions. Got her MRI results and will be receiving an injection to her foot later today.    Pertinent History thyroid disease, HLD, hx breast CA with lumpectomy 2005, anxiety    Diagnostic tests 08/04/19 L foot xray: bony resorptive changes at the second PIP joint whichmay be postsurgical/posttraumatic, or due to infection. Stable appearance of the first MTPJ arthroplasty    Patient Stated Goals "walk without pain and wear shoes without pain"    Currently in Pain? No/denies              [facility] PT Assessment - 10/13/19 0001       Observation/Other Assessments   Focus on Therapeutic Outcomes (FOTO)  foot: 49 (51% limited, 38% predicted)                         [facility] Adult PT Treatment/Exercise - 10/13/19 0001      Exercises   Exercises Knee/Hip      Knee/Hip Exercises: Stretches   Passive Hamstring Stretch Left;2 reps;30 seconds    Passive Hamstring Stretch Limitations supine with strap    Other Knee/Hip Stretches L sitting HS and gastroc stretch 2x30" to tolerance      Manual Therapy   Manual Therapy Soft tissue mobilization;Myofascial release;Passive ROM;Joint mobilization    Manual therapy comments prone     Joint Mobilization L great toe MTP AP and PA mobs grade III for flexion and extension, MTP joint distraction grade III    Soft tissue mobilization STM/DTM to L plantar surface of foot, gastroc-soleus complex, and lateral HS   trigger point HS & gastroc laterally & medial respectively    Myofascial Release manual TPR to L medial plantar arch, medial gastroc, lateral HS    Passive ROM L great toe flexion and extension to tolerance      Ankle Exercises: Aerobic   Recumbent  Bike L1 x 6 min      Ankle Exercises: Stretches   Other Stretch L sitting heel raise stretch 10x5" to tolerance                  PT Education - 10/13/19 1104    Education Details update to HEP    Person(s) Educated Patient    Methods Explanation;Demonstration;Tactile cues;Handout;Verbal cues    Comprehension Verbalized understanding;Returned demonstration            PT Short Term Goals - 10/09/19 1026      PT SHORT TERM GOAL #1   Title Patient to be independent with initial HEP.    Time 3    Period Weeks    Status On-going    Target Date 10/25/19             PT Long Term Goals - 10/09/19 1027      PT LONG TERM GOAL #1   Title Patient to be independent with advanced HEP.    Time 6    Period Weeks    Status On-going      PT LONG TERM GOAL #2   Title Patient to demonstrate 15 degree  improvement in L great toe flexion/extension PROM.    Baseline L 1st digit flexion 7 deg, extension 25 deg    Time 6    Period Weeks    Status On-going      PT LONG TERM GOAL #3   Title Patient to demonstrate B LE strength >/=4+/5.    Time 6    Period Weeks    Status On-going      PT LONG TERM GOAL #4   Title Patient to report tolerance for 30 minutes of standing/walking without pain limiting.    Time 6    Period Weeks    Status On-going      PT LONG TERM GOAL #5   Title Patient to report 70% improvement in frequency of L LE cramping.    Time 6    Period Weeks    Status On-going                 Plan - 10/13/19 1105    Clinical Impression Statement Patient reports that she developed a severe L HS cramp on Wednesday night that she still feels when she walks.  10/07/19 L foot MRI showed 1st MTP joint arthroplasty with mildly abnormal marrow edema and thickening of the abductor hallucis and flexor hallucis brevis medial head tendons. Patient noting that she will be receiving an injection to her foot later today. Worked on STM, TPR, 1st MTP joint mobs and stretching to address soft tissue restriction and hypomobility. Patient with multiple trigger points in the L medial gastroc and lateral HS with tenderness in these regions. Worked on HS stretching in different positions for hopeful relief of HS pain and updated these into HEP. Patient reported understanding and without complaints at end of session.    Comorbidities thyroid disease, HLD, hx breast CA with lumpectomy 2005, anxiety    PT Treatment/Interventions ADLs/Self Care Home Management;Cryotherapy;Electrical Stimulation;Moist Heat;Balance training;Therapeutic exercise;Therapeutic activities;Functional mobility training;Stair training;Gait training;Ultrasound;Neuromuscular re-education;Patient/family education;Manual techniques;Vasopneumatic Device;Taping;Energy conservation;Dry needling;Passive range of motion;Scar mobilization      PT Next Visit Plan L HS stretching. STM to L calf, progress L ankle and great toe mobility    Consulted and Agree with Plan of Care Patient           Patient will benefit from skilled therapeutic intervention  in order to improve the following deficits and impairments:  Abnormal gait, Hypomobility, Increased edema, Decreased scar mobility, Decreased activity tolerance, Decreased strength, Pain, Increased fascial restricitons, Decreased balance, Difficulty walking, Increased muscle spasms, Improper body mechanics, Decreased range of motion, Impaired flexibility, Postural dysfunction  Visit Diagnosis: Pain in left toe(s)  Stiffness of left foot, not elsewhere classified  Other muscle spasm  Difficulty in walking, not elsewhere classified  Other abnormalities of gait and mobility     Problem List Patient Active Problem List   Diagnosis Date Noted  . Arthritis of left acromioclavicular joint 09/19/2019  . Metatarsal deformity, left 05/19/2019  . Hallux limitus of left foot 04/11/2019  . Hammer toe of left foot 04/11/2019  . Low back pain 04/10/2019  . Numbness of hand 04/10/2019  . Shoulder pain 04/10/2019  . History of lobular carcinoma in situ (LCIS) of  left breast   . Anxiety 07/02/2017  . Hypothyroidism 07/02/2017  . Hyperlipidemia 07/02/2017  . Vitamin D deficiency 04/09/2016  . Osteopenia 03/05/2014  . Carpal tunnel syndrome 11/01/2013  . Depressive disorder 01/21/2010  . Polyp of colon 03/26/2009  . Malignant tumor of breast ([place]) 01/19/2008     Janene Harvey, PT, DPT 10/13/19 11:13 AM   [facility] [address]  [place], [place], [address] Phone: [phone]   Fax:  [phone]  Name: Elaine Buck MRN: [id number] Date of Birth: Jul 12, 1955

## 2019-10-15 NOTE — Progress Notes (Signed)
Subjective: 64 year old female presents the office today for follow-up evaluation after MRI as well as a steroid injection.  She states that she still getting discomfort to her foot she also has quite a bit of tightness to her legs.  She has had 2 therapy sessions so far. Denies any systemic complaints such as fevers, chills, nausea, vomiting. No acute changes since last appointment, and no other complaints at this time.   Objective: AAO x3, NAD DP/PT pulses palpable bilaterally, CRT less than 3 seconds Majority tenderness is still localized to the plantar aspect left foot along the sesamoid plantarly.  There is no severe pain with MPJ range of motion.  There is no other areas of pinpoint tenderness identified this time.  Equinus is evident. No pain with calf compression, swelling, warmth, erythema  Assessment: Sesamoiditis  Plan: -All treatment options discussed with the patient including all alternatives, risks, complications.  -MRI was again reviewed with her.  Today letter to proceed with a steroid injection.  Skin was cleaned alcohol and 1 cc dexamethasone phosphate, 1 cc Marcaine plain was infiltrated on the sesamoid complex without complications.  Postinjection care discussed.  She tolerated well.  I also added a pad inside of her orthotics to help further offload the sesamoids.  Continue physical therapy for now.  Also discussed possible revision of the implant versus sesamoidectomy.  [name]  -Patient encouraged to call the office with any questions, concerns, change in symptoms.

## 2019-10-16 ENCOUNTER — Encounter: Payer: Self-pay | Admitting: Physical Therapy

## 2019-10-16 ENCOUNTER — Ambulatory Visit: Payer: Medicare Other | Admitting: Physical Therapy

## 2019-10-16 ENCOUNTER — Other Ambulatory Visit: Payer: Self-pay

## 2019-10-16 DIAGNOSIS — M79675 Pain in left toe(s): Secondary | ICD-10-CM

## 2019-10-16 DIAGNOSIS — R262 Difficulty in walking, not elsewhere classified: Secondary | ICD-10-CM | POA: Diagnosis not present

## 2019-10-16 DIAGNOSIS — M25675 Stiffness of left foot, not elsewhere classified: Secondary | ICD-10-CM

## 2019-10-16 DIAGNOSIS — M62838 Other muscle spasm: Secondary | ICD-10-CM | POA: Diagnosis not present

## 2019-10-16 DIAGNOSIS — R2689 Other abnormalities of gait and mobility: Secondary | ICD-10-CM

## 2019-10-16 NOTE — Therapy (Signed)
[facility] [address]  [place], [place], [address] Phone: [phone]   Fax:  [phone]  Physical Therapy Treatment  Patient Details  Name: Elaine Buck MRN: [id number] Date of Birth: 1955/06/11 Referring Provider (PT): Celesta Gentile, DPM   Encounter Date: 10/16/2019   PT End of Session - 10/16/19 1008    Visit Number 4    Number of Visits 13    Date for PT Re-Evaluation 11/15/19    Authorization Type UHC Medicare    PT Start Time 0920    PT Stop Time 1005    PT Time Calculation (min) 45 min    Activity Tolerance Patient tolerated treatment well;Patient limited by pain    Behavior During Therapy [facility] for tasks assessed/performed           Past Medical History:  Diagnosis Date  . Anxiety   . Cancer ([place])    breast (Left)  . History of lobular carcinoma in situ (LCIS) of  left breast    s/p lumpectomy in 2005, Tamoxifen/Evista therapy for 5 years  . Hyperlipidemia   . Thyroid disease     Past Surgical History:  Procedure Laterality Date  . BREAST BIOPSY    . BREAST LUMPECTOMY Left 05/22/2003   LCIS Cells found in breast tissue  . FOOT SURGERY  04/2019    There were no vitals filed for this visit.   Subjective Assessment - 10/16/19 0921    Subjective Received an injection to the L sesamoid bone on Friday and was instructed not to perform any exercises over the weekend. Feeling about the same since last session. Discussing possible removal of the sesamoid bone. HS feels a little better but still getting cramping in this muscle group in the PM.    Pertinent History thyroid disease, HLD, hx breast CA with lumpectomy 2005, anxiety    Diagnostic tests 08/04/19 L foot xray: bony resorptive changes at the second PIP joint whichmay be postsurgical/posttraumatic, or due to infection. Stable appearance of the first MTPJ arthroplasty    Patient Stated Goals "walk without pain and wear shoes without pain"     Currently in Pain? No/denies                             [facility] Adult PT Treatment/Exercise - 10/16/19 0001      Knee/Hip Exercises: Stretches   Passive Hamstring Stretch Left;2 reps;30 seconds    Passive Hamstring Stretch Limitations supine with strap    Other Knee/Hip Stretches L sciatic nerve glide in supine with strap assist x20      Manual Therapy   Manual Therapy Soft tissue mobilization;Myofascial release;Passive ROM;Joint mobilization    Manual therapy comments prone     Joint Mobilization L great toe MTP distraction to improve tolerance for PROM    Soft tissue mobilization STM/DTM and IASTM to L plantar surface of foot, gastroc-soleus complex, and lateral HS   multiple tender trigger pts in medial gastroc and lateral HS   Myofascial Release manual TPR to L medial plantar arch, medial gastroc, lateral HS    Passive ROM L great toe flexion and extension to tolerance      Ankle Exercises: Aerobic   Recumbent Bike L1 x 6 min      Ankle Exercises: Stretches   Plantar Fascia Stretch 2 reps;30 seconds    Plantar Fascia Stretch Limitations toes on cabinet    Gastroc Stretch 2  reps;30 seconds    Gastroc Stretch Limitations runner's stretch   cues to avoid eversion   Other Stretch L anterior tibialis stretch standing using counter for support 5x10" to tolerance   discontonuied d/t foot cramping     Ankle Exercises: Standing   Heel Raises Both;15 reps    Heel Raises Limitations at counter   cues for equal wt shift and increased ROM                 PT Education - 10/16/19 1008    Education Details edu on use of moist heat and tennis ball massage for relief of muscle spasms    Person(s) Educated Patient    Methods Explanation;Demonstration;Tactile cues;Verbal cues    Comprehension Verbalized understanding            PT Short Term Goals - 10/16/19 1009      PT SHORT TERM GOAL #1   Title Patient to be independent with initial HEP.    Time 3     Period Weeks    Status Partially Met    Target Date 10/25/19             PT Long Term Goals - 10/09/19 1027      PT LONG TERM GOAL #1   Title Patient to be independent with advanced HEP.    Time 6    Period Weeks    Status On-going      PT LONG TERM GOAL #2   Title Patient to demonstrate 15 degree improvement in L great toe flexion/extension PROM.    Baseline L 1st digit flexion 7 deg, extension 25 deg    Time 6    Period Weeks    Status On-going      PT LONG TERM GOAL #3   Title Patient to demonstrate B LE strength >/=4+/5.    Time 6    Period Weeks    Status On-going      PT LONG TERM GOAL #4   Title Patient to report tolerance for 30 minutes of standing/walking without pain limiting.    Time 6    Period Weeks    Status On-going      PT LONG TERM GOAL #5   Title Patient to report 70% improvement in frequency of L LE cramping.    Time 6    Period Weeks    Status On-going                 Plan - 10/16/19 1009    Clinical Impression Statement Patient reporting that she received an injection to the L plantar sesamoid bone on Friday and was instructed not to perform any exercises over the weekend. Notes mild improvement in L HS pain since last session, but otherwise no different. Worked on L calf and foot stretching and standing ther-ex for improvement in great toe mobility and calf strength. Patient demonstrated limited ROM with L heel raise d/t hypomobility. Patient with c/o cramping in L plantar foot with plantarflexion/anterior tib stretching, thus this was discontinued. This episode of cramping was relieved with STM/DTM, IASTM, and manual TPR to L plantar foot musculature, gastroc/soleus, and HS. Also educated patient on use of moist heat and tennis ball massage to plantar surface of foot for relief of muscle cramping. Ended session with HS stretching and sciatic nerve glide with patient noting strong sensation of tightness behind the knee. Ended session without  further complaints. Plan to progress per POC.    Comorbidities thyroid disease,  HLD, hx breast CA with lumpectomy 2005, anxiety    PT Treatment/Interventions ADLs/Self Care Home Management;Cryotherapy;Electrical Stimulation;Moist Heat;Balance training;Therapeutic exercise;Therapeutic activities;Functional mobility training;Stair training;Gait training;Ultrasound;Neuromuscular re-education;Patient/family education;Manual techniques;Vasopneumatic Device;Taping;Energy conservation;Dry needling;Passive range of motion;Scar mobilization    PT Next Visit Plan L HS stretching. STM to L calf, progress L ankle and great toe mobility    Consulted and Agree with Plan of Care Patient           Patient will benefit from skilled therapeutic intervention in order to improve the following deficits and impairments:  Abnormal gait, Hypomobility, Increased edema, Decreased scar mobility, Decreased activity tolerance, Decreased strength, Pain, Increased fascial restricitons, Decreased balance, Difficulty walking, Increased muscle spasms, Improper body mechanics, Decreased range of motion, Impaired flexibility, Postural dysfunction  Visit Diagnosis: Pain in left toe(s)  Stiffness of left foot, not elsewhere classified  Other muscle spasm  Difficulty in walking, not elsewhere classified  Other abnormalities of gait and mobility     Problem List Patient Active Problem List   Diagnosis Date Noted  . Arthritis of left acromioclavicular joint 09/19/2019  . Metatarsal deformity, left 05/19/2019  . Hallux limitus of left foot 04/11/2019  . Hammer toe of left foot 04/11/2019  . Low back pain 04/10/2019  . Numbness of hand 04/10/2019  . Shoulder pain 04/10/2019  . History of lobular carcinoma in situ (LCIS) of  left breast   . Anxiety 07/02/2017  . Hypothyroidism 07/02/2017  . Hyperlipidemia 07/02/2017  . Vitamin D deficiency 04/09/2016  . Osteopenia 03/05/2014  . Carpal tunnel syndrome 11/01/2013  .  Depressive disorder 01/21/2010  . Polyp of colon 03/26/2009  . Malignant tumor of breast ([place]) 01/19/2008     Janene Harvey, PT, DPT 10/16/19 10:11 AM   [facility] [address]  [place], [place], [address] Phone: [phone]   Fax:  [phone]  Name: Elaine Buck MRN: [id number] Date of Birth: 1955-11-19

## 2019-10-17 ENCOUNTER — Telehealth: Payer: Self-pay | Admitting: Gastroenterology

## 2019-10-17 NOTE — Telephone Encounter (Signed)
Previous colon report received and going to place on Dr. Vivia Ewing desk for review.

## 2019-10-20 ENCOUNTER — Other Ambulatory Visit: Payer: Self-pay

## 2019-10-20 ENCOUNTER — Ambulatory Visit: Payer: Medicare Other | Admitting: Physical Therapy

## 2019-10-20 ENCOUNTER — Encounter: Payer: Self-pay | Admitting: Physical Therapy

## 2019-10-20 DIAGNOSIS — M79675 Pain in left toe(s): Secondary | ICD-10-CM

## 2019-10-20 DIAGNOSIS — R2689 Other abnormalities of gait and mobility: Secondary | ICD-10-CM

## 2019-10-20 DIAGNOSIS — M25675 Stiffness of left foot, not elsewhere classified: Secondary | ICD-10-CM | POA: Diagnosis not present

## 2019-10-20 DIAGNOSIS — R262 Difficulty in walking, not elsewhere classified: Secondary | ICD-10-CM

## 2019-10-20 DIAGNOSIS — M62838 Other muscle spasm: Secondary | ICD-10-CM

## 2019-10-20 NOTE — Therapy (Signed)
[facility] [address]  [place], [place], [address] Phone: [phone]   Fax:  [phone]  Physical Therapy Treatment  Patient Details  Name: Elaine Buck MRN: [id number] Date of Birth: 09/04/1955 Referring Provider (PT): Celesta Gentile, DPM   Encounter Date: 10/20/2019   PT End of Session - 10/20/19 1012    Visit Number 5    Number of Visits 13    Date for PT Re-Evaluation 11/15/19    Authorization Type UHC Medicare    PT Start Time 0931    PT Stop Time 1012    PT Time Calculation (min) 41 min    Activity Tolerance Patient tolerated treatment well;Patient limited by pain    Behavior During Therapy [facility] for tasks assessed/performed           Past Medical History:  Diagnosis Date  . Anxiety   . Cancer ([place])    breast (Left)  . History of lobular carcinoma in situ (LCIS) of  left breast    s/p lumpectomy in 2005, Tamoxifen/Evista therapy for 5 years  . Hyperlipidemia   . Thyroid disease     Past Surgical History:  Procedure Laterality Date  . BREAST BIOPSY    . BREAST LUMPECTOMY Left 05/22/2003   LCIS Cells found in breast tissue  . FOOT SURGERY  04/2019    There were no vitals filed for this visit.   Subjective Assessment - 10/20/19 0935    Subjective Notes that her HS feels better but the calf doesn't. Still having tightness in her toes and feels like the injection didn't help her.    Pertinent History thyroid disease, HLD, hx breast CA with lumpectomy 2005, anxiety    Diagnostic tests 08/04/19 L foot xray: bony resorptive changes at the second PIP joint whichmay be postsurgical/posttraumatic, or due to infection. Stable appearance of the first MTPJ arthroplasty    Patient Stated Goals "walk without pain and wear shoes without pain"    Currently in Pain? No/denies                             [facility] Adult PT Treatment/Exercise - 10/20/19 0001      Exercises   Exercises  Ankle      Ankle Exercises: Aerobic   Recumbent Bike L1 x 6 min      Ankle Exercises: Stretches   Soleus Stretch 1 rep;30 seconds    Soleus Stretch Limitations sitting with strap    Gastroc Stretch 30 seconds;3 reps    Gastroc Stretch Limitations sitting with strap      Ankle Exercises: Standing   Heel Raises Both;10 reps    Heel Raises Limitations at counter   cues to correct L ankle eversion- difficulty after correctio   Other Standing Ankle Exercises L great toe flexion into airex pad 10x3-5"   cues to avoid weight shift over toes     Ankle Exercises: Seated   Towel Crunch 2 reps    Towel Crunch Limitations 2x10   no c/o cramping   [place] x3 min    good ability but limite ROM, no pain   Heel Raises Left;10 reps;5 seconds   to tolerance; intermittent calf cramping   Other Seated Ankle Exercises L great toe flexion into airex pad 10x3-5" hold   cues to maintain foot flat                 PT  Education - 10/20/19 1012    Education Details update to [employer]) Educated Patient    Methods Explanation;Demonstration;Tactile cues;Verbal cues;Handout    Comprehension Verbalized understanding;Returned demonstration            PT Short Term Goals - 10/16/19 1009      PT SHORT TERM GOAL #1   Title Patient to be independent with initial HEP.    Time 3    Period Weeks    Status Partially Met    Target Date 10/25/19             PT Long Term Goals - 10/09/19 1027      PT LONG TERM GOAL #1   Title Patient to be independent with advanced HEP.    Time 6    Period Weeks    Status On-going      PT LONG TERM GOAL #2   Title Patient to demonstrate 15 degree improvement in L great toe flexion/extension PROM.    Baseline L 1st digit flexion 7 deg, extension 25 deg    Time 6    Period Weeks    Status On-going      PT LONG TERM GOAL #3   Title Patient to demonstrate B LE strength >/=4+/5.    Time 6    Period Weeks    Status On-going      PT LONG TERM  GOAL #4   Title Patient to report tolerance for 30 minutes of standing/walking without pain limiting.    Time 6    Period Weeks    Status On-going      PT LONG TERM GOAL #5   Title Patient to report 70% improvement in frequency of L LE cramping.    Time 6    Period Weeks    Status On-going                 Plan - 10/20/19 1013    Clinical Impression Statement Patient reporting improvement in L HS pain but still noting tightness in the calf and toes. Notes no improvement from recent cortisone injection. Worked on intrinsic foot muscle strengthening with patient demonstrating good ability to flex the great toe for functional activities. No c/o muscle spasms with these activities, but did c/o L calf cramp with sitting heel raises which was addressed with gastroc stretching. Active great toe flexion was promoted in sitting and standing with patient demonstrating good focus and effort but still demonstrating very limited ROM. Correction required to help patient maintain a neutral ankle with standing heel raises, as she has the tendency to evert her foot. Updated HEP with ther-ex that was well-tolerated today. Patient reported understanding and without complaints at end of session.    Comorbidities thyroid disease, HLD, hx breast CA with lumpectomy 2005, anxiety    PT Treatment/Interventions ADLs/Self Care Home Management;Cryotherapy;Electrical Stimulation;Moist Heat;Balance training;Therapeutic exercise;Therapeutic activities;Functional mobility training;Stair training;Gait training;Ultrasound;Neuromuscular re-education;Patient/family education;Manual techniques;Vasopneumatic Device;Taping;Energy conservation;Dry needling;Passive range of motion;Scar mobilization    PT Next Visit Plan L HS stretching. STM to L calf, progress L ankle and great toe mobility    Consulted and Agree with Plan of Care Patient           Patient will benefit from skilled therapeutic intervention in order to improve  the following deficits and impairments:  Abnormal gait, Hypomobility, Increased edema, Decreased scar mobility, Decreased activity tolerance, Decreased strength, Pain, Increased fascial restricitons, Decreased balance, Difficulty walking, Increased muscle spasms, Improper body mechanics, Decreased range of  motion, Impaired flexibility, Postural dysfunction  Visit Diagnosis: Pain in left toe(s)  Stiffness of left foot, not elsewhere classified  Other muscle spasm  Difficulty in walking, not elsewhere classified  Other abnormalities of gait and mobility     Problem List Patient Active Problem List   Diagnosis Date Noted  . Arthritis of left acromioclavicular joint 09/19/2019  . Metatarsal deformity, left 05/19/2019  . Hallux limitus of left foot 04/11/2019  . Hammer toe of left foot 04/11/2019  . Low back pain 04/10/2019  . Numbness of hand 04/10/2019  . Shoulder pain 04/10/2019  . History of lobular carcinoma in situ (LCIS) of  left breast   . Anxiety 07/02/2017  . Hypothyroidism 07/02/2017  . Hyperlipidemia 07/02/2017  . Vitamin D deficiency 04/09/2016  . Osteopenia 03/05/2014  . Carpal tunnel syndrome 11/01/2013  . Depressive disorder 01/21/2010  . Polyp of colon 03/26/2009  . Malignant tumor of breast ([place]) 01/19/2008     Janene Harvey, PT, DPT 10/20/19 10:14 AM   [facility] [address]  [place], [place], [address] Phone: [phone]   Fax:  [phone]  Name: Elaine Buck MRN: [id number] Date of Birth: 13-Jun-1955

## 2019-10-23 ENCOUNTER — Encounter: Payer: Self-pay | Admitting: Physical Therapy

## 2019-10-23 ENCOUNTER — Other Ambulatory Visit: Payer: Self-pay

## 2019-10-23 ENCOUNTER — Ambulatory Visit: Payer: Medicare Other | Attending: Podiatry | Admitting: Physical Therapy

## 2019-10-23 DIAGNOSIS — M79675 Pain in left toe(s): Secondary | ICD-10-CM | POA: Diagnosis not present

## 2019-10-23 DIAGNOSIS — R2689 Other abnormalities of gait and mobility: Secondary | ICD-10-CM | POA: Diagnosis not present

## 2019-10-23 DIAGNOSIS — R262 Difficulty in walking, not elsewhere classified: Secondary | ICD-10-CM | POA: Diagnosis not present

## 2019-10-23 DIAGNOSIS — M62838 Other muscle spasm: Secondary | ICD-10-CM | POA: Diagnosis not present

## 2019-10-23 DIAGNOSIS — M25675 Stiffness of left foot, not elsewhere classified: Secondary | ICD-10-CM | POA: Insufficient documentation

## 2019-10-23 NOTE — Therapy (Signed)
[facility] [address]  [place], [place], [address] Phone: [phone]   Fax:  [phone]  Physical Therapy Treatment  Patient Details  Name: Elaine Buck MRN: [id number] Date of Birth: 06-May-1955 Referring Provider (PT): Celesta Gentile, DPM   Encounter Date: 10/23/2019   PT End of Session - 10/23/19 1153    Visit Number 6    Number of Visits 13    Date for PT Re-Evaluation 11/15/19    Authorization Type UHC Medicare    PT Start Time 1018    PT Stop Time 1059    PT Time Calculation (min) 41 min    Activity Tolerance Patient tolerated treatment well;Patient limited by pain    Behavior During Therapy [facility] for tasks assessed/performed           Past Medical History:  Diagnosis Date  . Anxiety   . Cancer ([place])    breast (Left)  . History of lobular carcinoma in situ (LCIS) of  left breast    s/p lumpectomy in 2005, Tamoxifen/Evista therapy for 5 years  . Hyperlipidemia   . Thyroid disease     Past Surgical History:  Procedure Laterality Date  . BREAST BIOPSY    . BREAST LUMPECTOMY Left 05/22/2003   LCIS Cells found in breast tissue  . FOOT SURGERY  04/2019    There were no vitals filed for this visit.   Subjective Assessment - 10/23/19 1020    Subjective Not much is new. HS cramping is improved but calf spasms remain.    Pertinent History thyroid disease, HLD, hx breast CA with lumpectomy 2005, anxiety    Diagnostic tests 08/04/19 L foot xray: bony resorptive changes at the second PIP joint whichmay be postsurgical/posttraumatic, or due to infection. Stable appearance of the first MTPJ arthroplasty    Patient Stated Goals "walk without pain and wear shoes without pain"    Currently in Pain? No/denies                             [facility] Adult PT Treatment/Exercise - 10/23/19 0001      Self-Care   Self-Care Other Self-Care Comments    Other Self-Care Comments  edu and practice  using self-STM to L medial calf using green ball on wall      Knee/Hip Exercises: Aerobic   Stationary Bike L1 x 6 min      Manual Therapy   Manual Therapy Soft tissue mobilization;Myofascial release;Passive ROM;Joint mobilization    Manual therapy comments prone     Joint Mobilization L great toe MTP distraction to improve tolerance for PROM, L great toe MTP PA and AP mobs for flexion and extension    Soft tissue mobilization STM/DTM and IASTM to L plantar surface of foot, medial gastroc-soleus complex   still TTP over medial calf but soft tissue restriction impro   Myofascial Release manual TPR to L medial plantar arch, medial gastroc    Passive ROM L great toe flexion and extension to tolerance, L ankle dorsiflexion + great toe extension stretch 2x30" to tolerance      Ankle Exercises: Aerobic   Recumbent Bike L1 x 6 min      Ankle Exercises: Seated   Heel Raises Left;10 reps;5 seconds   toes elevated on 7.5 ankle wt                 PT Education - 10/23/19 1100  Education Details update to HEP    Person(s) Educated Patient    Methods Explanation;Demonstration;Tactile cues;Verbal cues;Handout    Comprehension Verbalized understanding;Returned demonstration            PT Short Term Goals - 10/16/19 1009      PT SHORT TERM GOAL #1   Title Patient to be independent with initial HEP.    Time 3    Period Weeks    Status Partially Met    Target Date 10/25/19             PT Long Term Goals - 10/09/19 1027      PT LONG TERM GOAL #1   Title Patient to be independent with advanced HEP.    Time 6    Period Weeks    Status On-going      PT LONG TERM GOAL #2   Title Patient to demonstrate 15 degree improvement in L great toe flexion/extension PROM.    Baseline L 1st digit flexion 7 deg, extension 25 deg    Time 6    Period Weeks    Status On-going      PT LONG TERM GOAL #3   Title Patient to demonstrate B LE strength >/=4+/5.    Time 6    Period Weeks     Status On-going      PT LONG TERM GOAL #4   Title Patient to report tolerance for 30 minutes of standing/walking without pain limiting.    Time 6    Period Weeks    Status On-going      PT LONG TERM GOAL #5   Title Patient to report 70% improvement in frequency of L LE cramping.    Time 6    Period Weeks    Status On-going                 Plan - 10/23/19 1154    Clinical Impression Statement Patient reported no new complaints. Reporting continued muscle spasms in her L calf, but improvement in muscle pain in the L HS. Patient reporting benefit from MT, thus focused on addressing soft tissue restriction and hypomobility with MT today. Patient with relative improvement in L medial gastroc tightness today, but still reporting considerable tenderness in this area. Tolerated L great toe joint mobs and passive stretching well, with small improvement in MTP extension ROM today. Worked on addressing ROM with ther-ex and self-stretching for continued improvement and educated patient on self-STM using ball against wall. Patient reported understanding. No complaints at end of session. Patient demonstrating slow but steady progress towards goals.    Comorbidities thyroid disease, HLD, hx breast CA with lumpectomy 2005, anxiety    PT Treatment/Interventions ADLs/Self Care Home Management;Cryotherapy;Electrical Stimulation;Moist Heat;Balance training;Therapeutic exercise;Therapeutic activities;Functional mobility training;Stair training;Gait training;Ultrasound;Neuromuscular re-education;Patient/family education;Manual techniques;Vasopneumatic Device;Taping;Energy conservation;Dry needling;Passive range of motion;Scar mobilization    PT Next Visit Plan L HS stretching. STM to L calf, progress L ankle and great toe mobility    Consulted and Agree with Plan of Care Patient           Patient will benefit from skilled therapeutic intervention in order to improve the following deficits and  impairments:  Abnormal gait, Hypomobility, Increased edema, Decreased scar mobility, Decreased activity tolerance, Decreased strength, Pain, Increased fascial restricitons, Decreased balance, Difficulty walking, Increased muscle spasms, Improper body mechanics, Decreased range of motion, Impaired flexibility, Postural dysfunction  Visit Diagnosis: Pain in left toe(s)  Stiffness of left foot, not elsewhere classified  Other muscle  spasm  Difficulty in walking, not elsewhere classified  Other abnormalities of gait and mobility     Problem List Patient Active Problem List   Diagnosis Date Noted  . Arthritis of left acromioclavicular joint 09/19/2019  . Metatarsal deformity, left 05/19/2019  . Hallux limitus of left foot 04/11/2019  . Hammer toe of left foot 04/11/2019  . Low back pain 04/10/2019  . Numbness of hand 04/10/2019  . Shoulder pain 04/10/2019  . History of lobular carcinoma in situ (LCIS) of  left breast   . Anxiety 07/02/2017  . Hypothyroidism 07/02/2017  . Hyperlipidemia 07/02/2017  . Vitamin D deficiency 04/09/2016  . Osteopenia 03/05/2014  . Carpal tunnel syndrome 11/01/2013  . Depressive disorder 01/21/2010  . Polyp of colon 03/26/2009  . Malignant tumor of breast ([place]) 01/19/2008     Janene Harvey, PT, DPT 10/23/19 12:00 PM   [facility] [address]  [place], [place], [address] Phone: [phone]   Fax:  [phone]  Name: Elaine Buck MRN: [id number] Date of Birth: [birth year]

## 2019-10-24 NOTE — Telephone Encounter (Signed)
Records received and reviewed and notable for the following:  -Colonoscopy (2010): Tubular adenomas and hyperplastic polyp of the sigmoid and rectum. -Colonoscopy (11/24/2011, Dr. Donnella Sham, MD, [place]): Sigmoid diverticulosis, 3 polyps in the proximal and distal sigmoid colon, all hyperplastic.  Moderate internal hemorrhoids -Colonoscopy (04/15/2016, Dr. Donnella Sham, MD, [place]): Sigmoid/descending diverticulosis, 6 mm rectal polyp.  No path with report.  If possible, please try to obtain previous path report from colonoscopy on 04/15/2016.  Based on a single subcentimeter polyp, would not be due for colonoscopy at this juncture unless there is another indication.  Recommend office visit.  Thank you.

## 2019-10-24 NOTE — Telephone Encounter (Signed)
Lm on vmail to call back to schedule a OV

## 2019-10-26 ENCOUNTER — Ambulatory Visit: Payer: Medicare Other

## 2019-10-26 ENCOUNTER — Other Ambulatory Visit: Payer: Self-pay

## 2019-10-26 DIAGNOSIS — R262 Difficulty in walking, not elsewhere classified: Secondary | ICD-10-CM | POA: Diagnosis not present

## 2019-10-26 DIAGNOSIS — M79675 Pain in left toe(s): Secondary | ICD-10-CM | POA: Diagnosis not present

## 2019-10-26 DIAGNOSIS — M62838 Other muscle spasm: Secondary | ICD-10-CM | POA: Diagnosis not present

## 2019-10-26 DIAGNOSIS — R2689 Other abnormalities of gait and mobility: Secondary | ICD-10-CM

## 2019-10-26 DIAGNOSIS — M25675 Stiffness of left foot, not elsewhere classified: Secondary | ICD-10-CM

## 2019-10-26 NOTE — Therapy (Signed)
[facility] [address]  [place], [place], [address] Phone: [phone]   Fax:  [phone]  Physical Therapy Treatment  Patient Details  Name: Elaine Buck MRN: [id number] Date of Birth: 1956-02-25 Referring Provider (PT): Celesta Gentile, DPM   Encounter Date: 10/26/2019   PT End of Session - 10/26/19 1020    Visit Number 7    Number of Visits 13    Date for PT Re-Evaluation 11/15/19    Authorization Type UHC Medicare    PT Start Time 1015    PT Stop Time 1105    PT Time Calculation (min) 50 min    Activity Tolerance Patient tolerated treatment well;Patient limited by pain    Behavior During Therapy [facility] for tasks assessed/performed           Past Medical History:  Diagnosis Date  . Anxiety   . Cancer ([place])    breast (Left)  . History of lobular carcinoma in situ (LCIS) of  left breast    s/p lumpectomy in 2005, Tamoxifen/Evista therapy for 5 years  . Hyperlipidemia   . Thyroid disease     Past Surgical History:  Procedure Laterality Date  . BREAST BIOPSY    . BREAST LUMPECTOMY Left 05/22/2003   LCIS Cells found in breast tissue  . FOOT SURGERY  04/2019    There were no vitals filed for this visit.   Subjective Assessment - 10/26/19 1018    Subjective Reports she is still having L calf/HS, "cramps" 3-4x/wk.    Pertinent History thyroid disease, HLD, hx breast CA with lumpectomy 2005, anxiety    Diagnostic tests 08/04/19 L foot xray: bony resorptive changes at the second PIP joint whichmay be postsurgical/posttraumatic, or due to infection. Stable appearance of the first MTPJ arthroplasty    Patient Stated Goals "walk without pain and wear shoes without pain"    Currently in Pain? No/denies    Pain Score 0-No pain   10/10 L LE cramps at night   Pain Location Leg    Pain Orientation Left    Pain Descriptors / Indicators Cramping    Pain Type Chronic pain    Multiple Pain Sites No                [facility] PT Assessment - 10/26/19 0001      AROM   Left Ankle Dorsiflexion 28    Left Ankle Plantar Flexion 42      PROM   Overall PROM Comments L 1st digit flexion 28 deg, extension 40 deg      Strength   Right/Left Hip Right;Left    Right Hip Flexion 5/5    Right Hip Extension 4/5    Right Hip External Rotation  4+/5    Right Hip Internal Rotation 4+/5    Right Hip ABduction 4+/5    Right Hip ADduction 4+/5    Left Hip Flexion 4+/5    Left Hip Extension 4+/5    Left Hip External Rotation 4+/5    Left Hip Internal Rotation 4+/5    Left Hip ABduction 4+/5    Left Hip ADduction 4+/5    Right/Left Knee Right;Left    Right Knee Flexion 5/5    Right Knee Extension 5/5    Left Knee Flexion 5/5    Left Knee Extension 5/5    Right/Left Ankle Right;Left    Right Ankle Dorsiflexion 5/5    Right Ankle Plantar Flexion 5/5    Right  Ankle Inversion 5/5    Right Ankle Eversion 5/5    Left Ankle Dorsiflexion 5/5    Left Ankle Plantar Flexion 3+/5    Left Ankle Inversion 5/5    Left Ankle Eversion 5/5                         [facility] Adult PT Treatment/Exercise - 10/26/19 0001      Knee/Hip Exercises: Aerobic   Stationary Bike L1 x 6 min      Knee/Hip Exercises: Supine   Bridges Both;10 reps;Strengthening    Bridges Limitations cues for increased ROM       Manual Therapy   Manual Therapy Passive ROM    Passive ROM Manual L great toe extension, flexion stretch       Ankle Exercises: Stretches   Soleus Stretch 1 rep;30 seconds    Soleus Stretch Limitations standing     Gastroc Stretch 30 seconds;3 reps    Gastroc Stretch Limitations standing                   PT Education - 10/26/19 1154    Education Details HEP updatae; bridge    Person(s) Educated Patient    Methods Explanation;Demonstration;Verbal cues;Handout    Comprehension Verbalized understanding;Returned demonstration;Verbal cues required            PT Short Term Goals - 10/26/19  1022      PT SHORT TERM GOAL #1   Title Patient to be independent with initial HEP.    Time 3    Period Weeks    Status Achieved    Target Date 10/25/19             PT Long Term Goals - 10/26/19 1023      PT LONG TERM GOAL #1   Title Patient to be independent with advanced HEP.    Time 6    Period Weeks    Status Partially Met   met for current HEP     PT LONG TERM GOAL #2   Title Patient to demonstrate 15 degree improvement in L great toe flexion/extension PROM.    Baseline L 1st digit flexion 7 deg, extension 25 deg    Time 6    Period Weeks    Status Achieved   08/05: great toe extension PROM 40 dg, 28 dg     PT LONG TERM GOAL #3   Title Patient to demonstrate B LE strength >/=4+/5.    Time 6    Period Weeks    Status Partially Met   08/05: met for all with exception of L PF strength 3+/5 secondary to L toe pain and R hip extension pain     PT LONG TERM GOAL #4   Title Patient to report tolerance for 30 minutes of standing/walking without pain limiting.    Time 6    Period Weeks    Status On-going   08/05: notes 15 min standing/walking with aching pain in L toes up to 5/10     PT LONG TERM GOAL #5   Title Patient to report 70% improvement in frequency of L LE cramping.    Time 6    Period Weeks    Status Partially Met   08/05: Notes 60% HS cramping, 10% improvement in L calf cramping                Plan - 10/26/19 1022    Clinical Impression Statement Pt.  has made good progress with physical therapy.  Reports daily adherence with HEP and able to achieve LTG #2 demonstrating over 15dg improvement in L great toe PROM ext./flexion.  Partially met LTG #3 demonstrating B LE strength with MMT of 4+/5 with extension of R hip extension strength, L PF strength with notable deficits.  HEP updated to address strength deficits with handout issue to pt.  LTG #4 ongoing as pt. noting only able to demo 15 min standing/walking before onset of aching pain in L toes up to  5/10 causing her to have to take a sitting break.  Pt. primary complaint is limited walking tolerance and L calf cramping along with L toe "stiffness" which limits walking and sleeping tolerance.  Pt. noting 60% improvement in L HS cramping and 10% improvement in L calf cramping with calf cramping primarily at night waking her from sleep.  LTG #5 partially achieved.  Pt. to see MD tomorrow for f/u.  Will continue to benefit from further skilled therapy for improved functional mobility and activity tolerance.    Comorbidities thyroid disease, HLD, hx breast CA with lumpectomy 2005, anxiety    Rehab Potential Good    PT Frequency 2x / week    PT Duration 6 weeks    PT Treatment/Interventions ADLs/Self Care Home Management;Cryotherapy;Electrical Stimulation;Moist Heat;Balance training;Therapeutic exercise;Therapeutic activities;Functional mobility training;Stair training;Gait training;Ultrasound;Neuromuscular re-education;Patient/family education;Manual techniques;Vasopneumatic Device;Taping;Energy conservation;Dry needling;Passive range of motion;Scar mobilization    PT Next Visit Plan L HS stretching. STM to L calf, progress L ankle and great toe mobility    Consulted and Agree with Plan of Care Patient           Patient will benefit from skilled therapeutic intervention in order to improve the following deficits and impairments:  Abnormal gait, Hypomobility, Increased edema, Decreased scar mobility, Decreased activity tolerance, Decreased strength, Pain, Increased fascial restricitons, Decreased balance, Difficulty walking, Increased muscle spasms, Improper body mechanics, Decreased range of motion, Impaired flexibility, Postural dysfunction  Visit Diagnosis: Pain in left toe(s)  Stiffness of left foot, not elsewhere classified  Other muscle spasm  Difficulty in walking, not elsewhere classified  Other abnormalities of gait and mobility     Problem List Patient Active Problem List    Diagnosis Date Noted  . Arthritis of left acromioclavicular joint 09/19/2019  . Metatarsal deformity, left 05/19/2019  . Hallux limitus of left foot 04/11/2019  . Hammer toe of left foot 04/11/2019  . Low back pain 04/10/2019  . Numbness of hand 04/10/2019  . Shoulder pain 04/10/2019  . History of lobular carcinoma in situ (LCIS) of  left breast   . Anxiety 07/02/2017  . Hypothyroidism 07/02/2017  . Hyperlipidemia 07/02/2017  . Vitamin D deficiency 04/09/2016  . Osteopenia 03/05/2014  . Carpal tunnel syndrome 11/01/2013  . Depressive disorder 01/21/2010  . Polyp of colon 03/26/2009  . Malignant tumor of breast [facility]) 01/19/2008    Bess Harvest, PTA 10/26/19 12:04 PM'   [facility] [address]  [place], [place], [address] Phone: [phone]   Fax:  [phone]  Name: Elaine Buck MRN: [id number] Date of Birth: 06/26/1955

## 2019-10-27 ENCOUNTER — Ambulatory Visit: Payer: BLUE CROSS/BLUE SHIELD | Admitting: Podiatry

## 2019-10-27 ENCOUNTER — Ambulatory Visit: Payer: Medicare Other

## 2019-10-27 ENCOUNTER — Encounter: Payer: Self-pay | Admitting: Podiatry

## 2019-10-27 VITALS — Temp 97.6°F

## 2019-10-27 DIAGNOSIS — R252 Cramp and spasm: Secondary | ICD-10-CM

## 2019-10-27 DIAGNOSIS — M2580 Other specified joint disorders, unspecified joint: Secondary | ICD-10-CM

## 2019-10-27 DIAGNOSIS — M205X2 Other deformities of toe(s) (acquired), left foot: Secondary | ICD-10-CM

## 2019-10-27 DIAGNOSIS — M779 Enthesopathy, unspecified: Secondary | ICD-10-CM

## 2019-10-27 DIAGNOSIS — M792 Neuralgia and neuritis, unspecified: Secondary | ICD-10-CM

## 2019-10-27 DIAGNOSIS — M79662 Pain in left lower leg: Secondary | ICD-10-CM | POA: Diagnosis not present

## 2019-10-27 IMAGING — US US EXTREM LOW VENOUS*L*
1 series · 14 of 24 positions shown · non-contrast
Comparison: None.

CLINICAL DATA: Left lower extremity cramping

EXAM:
LEFT LOWER EXTREMITY VENOUS DOPPLER ULTRASOUND
TECHNIQUE: Gray-scale sonography with compression, as well as color and duplex
ultrasound, were performed to evaluate the deep venous system(s)
from the level of the common femoral vein through the popliteal and
proximal calf veins.

[Series 1: us extrem low venous*left* · 0.07mm/px · 14 of 39 slices shown]
[im 1/39]
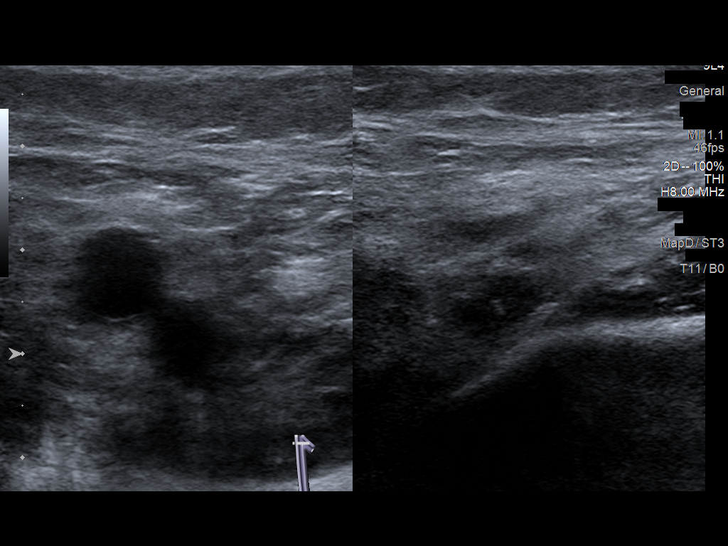
[im 4/39]
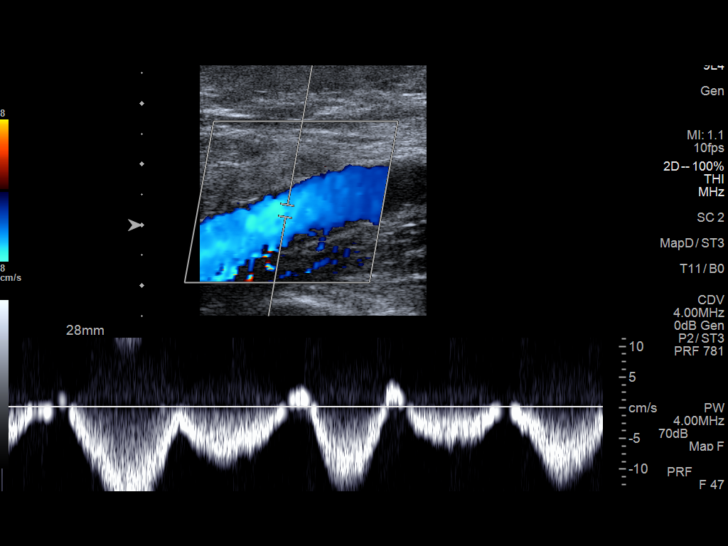
[im 7/39]
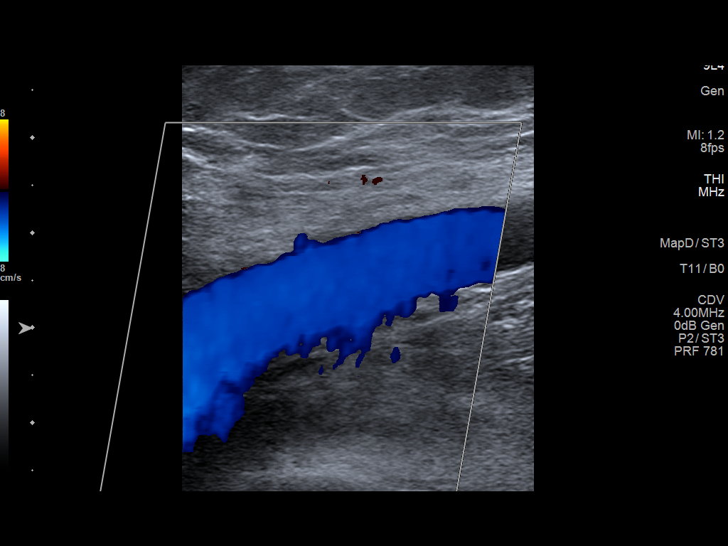
[im 10/39]
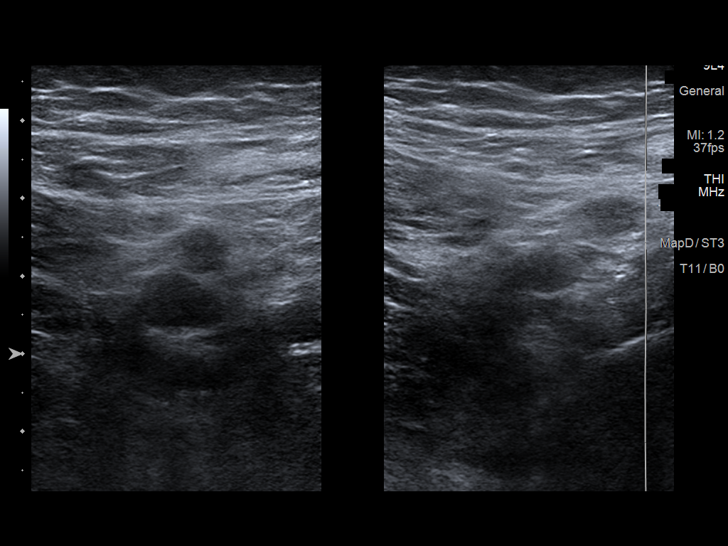
[im 12/39]
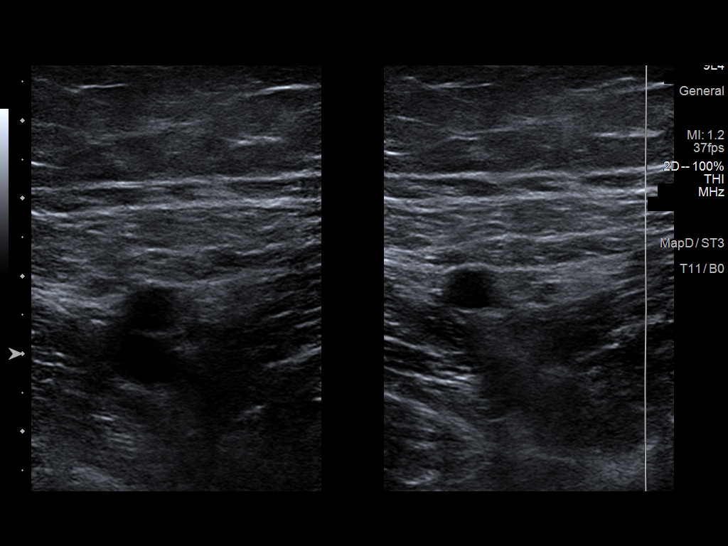
[im 15/39]
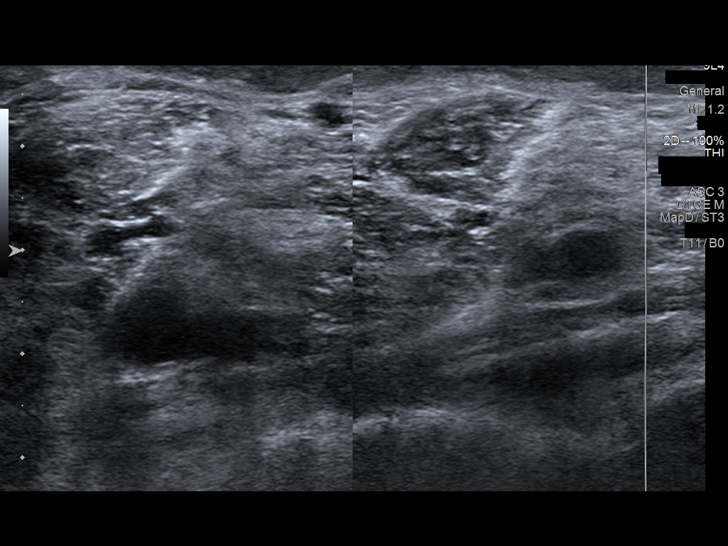
[im 19/39]
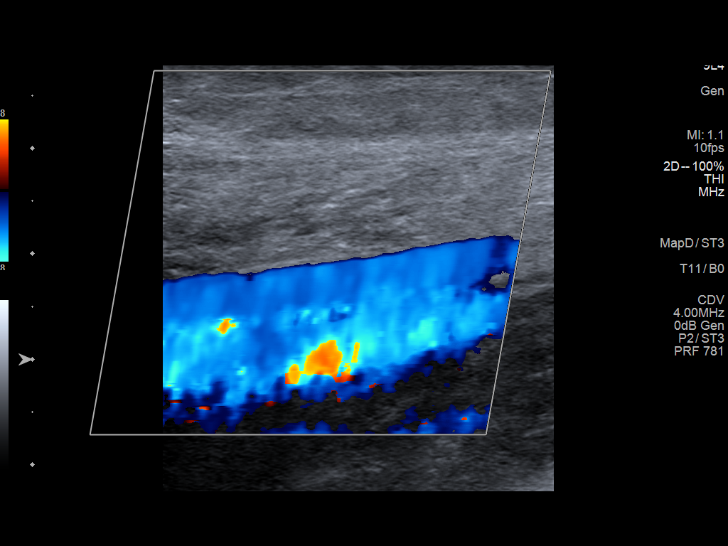
[im 20/39]
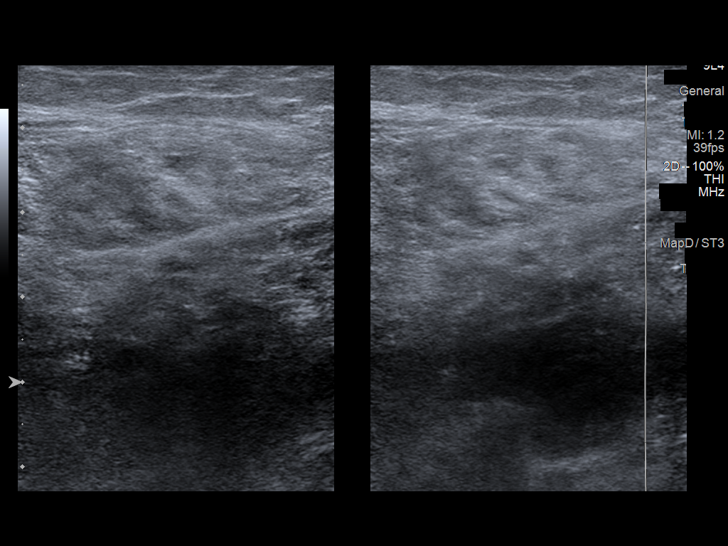
[im 24/39]
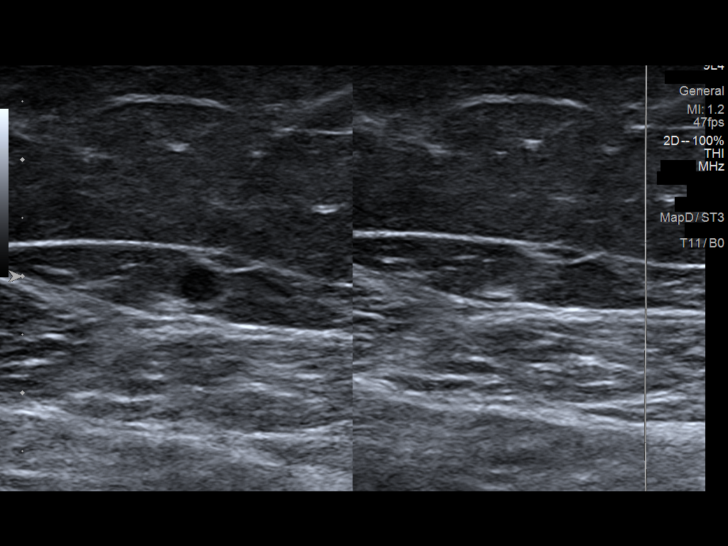
[im 27/39]
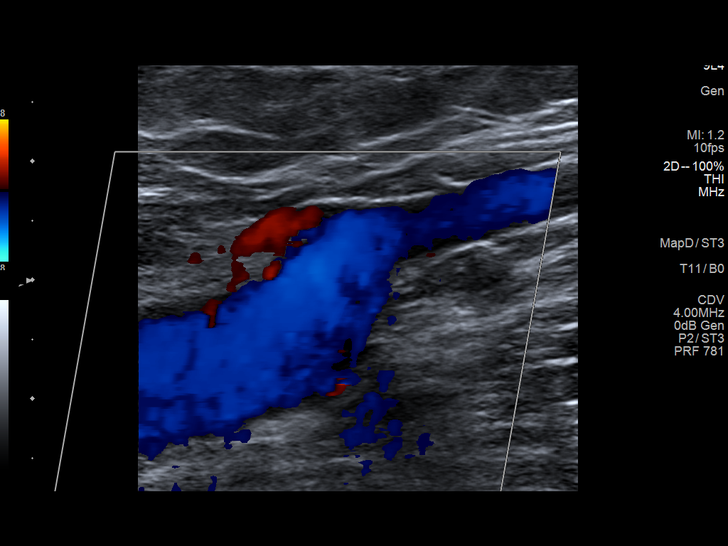
[im 30/39]
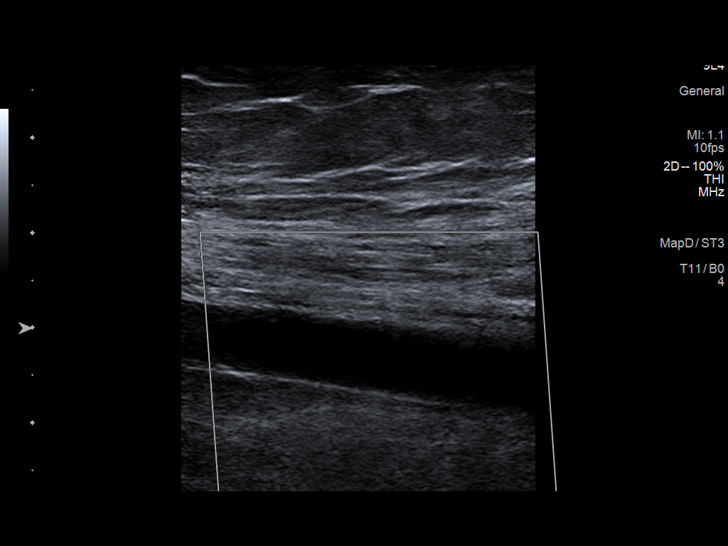
[im 32/39]
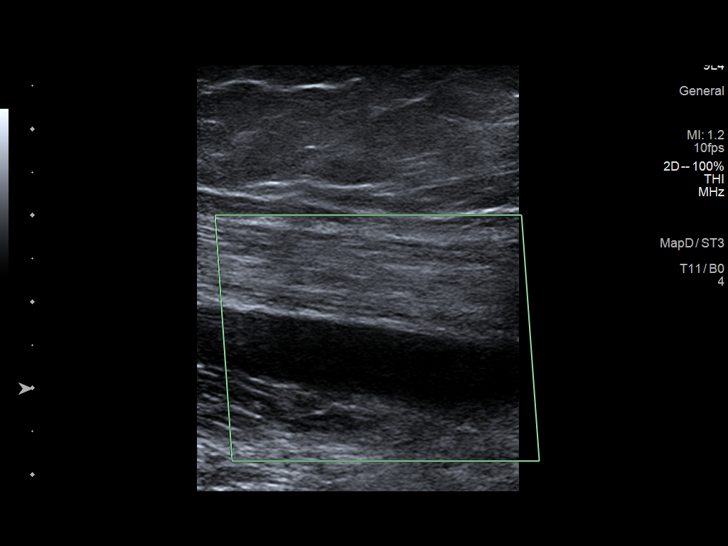
[im 35/39]
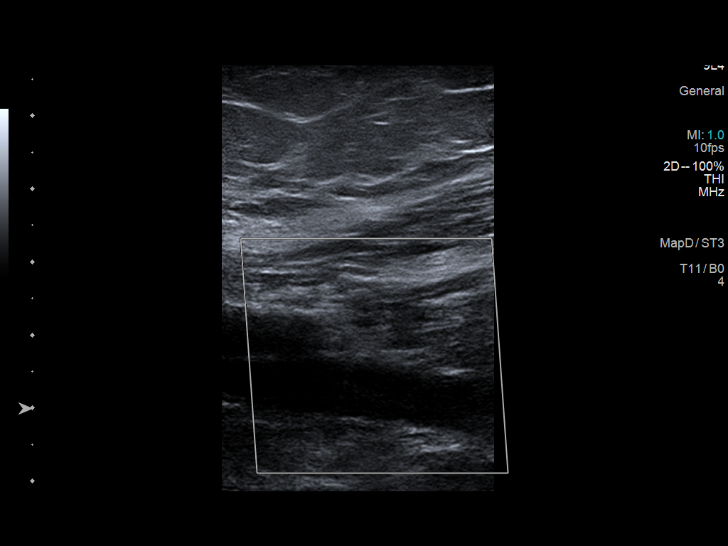
[im 39/39]
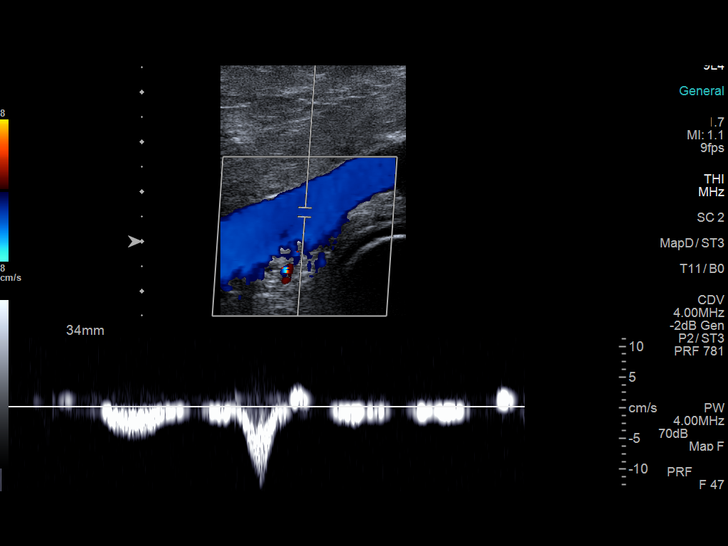

[14 of 24 positions shown; findings below may reference images not displayed]

FINDINGS: VENOUS

Normal compressibility of the common femoral, superficial femoral,
and popliteal veins, as well as the visualized calf veins.
Visualized portions of profunda femoral vein and great saphenous
vein unremarkable. No filling defects to suggest DVT on grayscale or
color Doppler imaging. Doppler waveforms show normal direction of
venous flow, normal respiratory plasticity and response to
augmentation.

Limited views of the contralateral common femoral vein are
unremarkable.

OTHER

None.

Limitations: none
IMPRESSION: Negative.

## 2019-10-27 NOTE — Progress Notes (Addendum)
Subjective: 64 year old female presents the office today for follow-up evaluation after having a steroid injection into the sesamoid complex.  She states this helped quite a bit she denies any significant pain at this time but she is still having cramping and pain in her legs which are achy when she gets up in the morning.  She is making some progress with physical therapy but still having tightness overall.  She states that she has a family member who had tightness after surgery and was told she had a neuroma and after the injection that resolved.  She is concerned about this.  Denies any systemic complaints such as fevers, chills, nausea, vomiting. No acute changes since last appointment, and no other complaints at this time.   Objective: AAO x3, NAD DP/PT pulses palpable bilaterally, CRT less than 3 seconds Prominence of the foot more complex noted but there is no significant tenderness to exam today overall area is much better.  Upon percussion of the deep peroneal nerve is where she gets discomfort.  She is having nerve symptoms that she had this prior to surgery as well which we have tried Lyrica and she is on gabapentin with a significant improvement.  She also has some tenderness on the course of the tibialis anterior tendon on the anterior ankle.  Overall the flexor, extensor tendons appear to be intact.  Equinus is evident. No pain with calf compression, swelling, warmth, erythema  Assessment: Sesamoiditis with improvement, calf tightness, cramping  Plan: -All treatment options discussed with the patient including all alternatives, risks, complications. -We will continue to monitor better.  However injection the area that she is having symptomatic course the deep peroneal nerve.  I did perform a steroid injection on this area today with care to avoid the dorsalis pedis artery.  Skin was cleaned with alcohol and a mixture of half cc dexamethasone phosphate and half cc Marcaine plain was  infiltrated into the area no complications.  Postinjection care discussed. -Continue physical therapy, orthotics and supportive shoes. -Venous duplex ordered given chronic leg cramping in the calf although suspicion low. -Discussed possible nerve conduction test given the nerve symptoms which seems to be ongoing prior to the surgery as well seems to be worsening   [name]

## 2019-10-30 ENCOUNTER — Other Ambulatory Visit: Payer: Self-pay

## 2019-10-30 ENCOUNTER — Ambulatory Visit: Payer: Medicare Other | Admitting: Physical Therapy

## 2019-10-30 ENCOUNTER — Encounter: Payer: Self-pay | Admitting: Podiatry

## 2019-10-30 ENCOUNTER — Encounter: Payer: Self-pay | Admitting: Physical Therapy

## 2019-10-30 DIAGNOSIS — R262 Difficulty in walking, not elsewhere classified: Secondary | ICD-10-CM

## 2019-10-30 DIAGNOSIS — M79675 Pain in left toe(s): Secondary | ICD-10-CM

## 2019-10-30 DIAGNOSIS — M62838 Other muscle spasm: Secondary | ICD-10-CM | POA: Diagnosis not present

## 2019-10-30 DIAGNOSIS — R2689 Other abnormalities of gait and mobility: Secondary | ICD-10-CM | POA: Diagnosis not present

## 2019-10-30 DIAGNOSIS — M25675 Stiffness of left foot, not elsewhere classified: Secondary | ICD-10-CM

## 2019-10-30 NOTE — Telephone Encounter (Signed)
LMOM for pt to cb and schedule an office visit appt.

## 2019-10-30 NOTE — Therapy (Signed)
[facility] [address]  [place], [place], [address] Phone: [phone]   Fax:  [phone]  Physical Therapy Treatment  Patient Details  Name: Elaine Buck MRN: [id number] Date of Birth: 1955/09/01 Referring Provider (PT): Celesta Gentile, DPM   Encounter Date: 10/30/2019   PT End of Session - 10/30/19 1058    Visit Number 8    Number of Visits 13    Date for PT Re-Evaluation 11/15/19    Authorization Type UHC Medicare    PT Start Time 1016    PT Stop Time 1057    PT Time Calculation (min) 41 min    Activity Tolerance Patient tolerated treatment well;Patient limited by pain    Behavior During Therapy [facility] for tasks assessed/performed           Past Medical History:  Diagnosis Date  . Anxiety   . Cancer ([place])    breast (Left)  . History of lobular carcinoma in situ (LCIS) of  left breast    s/p lumpectomy in 2005, Tamoxifen/Evista therapy for 5 years  . Hyperlipidemia   . Thyroid disease     Past Surgical History:  Procedure Laterality Date  . BREAST BIOPSY    . BREAST LUMPECTOMY Left 05/22/2003   LCIS Cells found in breast tissue  . FOOT SURGERY  04/2019    There were no vitals filed for this visit.   Subjective Assessment - 10/30/19 1021    Subjective Had an [place] on her leg to check for blood clots- this was normal. Received a cortisone shot to her foot on Friday- has not felt a difference. Next step would be a nerve test. No cramps all weekend.    Pertinent History thyroid disease, HLD, hx breast CA with lumpectomy 2005, anxiety    Diagnostic tests 08/04/19 L foot xray: bony resorptive changes at the second PIP joint whichmay be postsurgical/posttraumatic, or due to infection. Stable appearance of the first MTPJ arthroplasty    Patient Stated Goals "walk without pain and wear shoes without pain"    Currently in Pain? No/denies                             [facility] Adult PT  Treatment/Exercise - 10/30/19 0001      Knee/Hip Exercises: Stretches   Passive Hamstring Stretch Left;2 reps;30 seconds    Passive Hamstring Stretch Limitations supine with strap    Other Knee/Hip Stretches L sciatic nerve glide in supine with strap assist x20      Knee/Hip Exercises: Aerobic   Stationary Bike L2 x 6 min      Ankle Exercises: Stretches   Gastroc Stretch 2 reps;30 seconds    Gastroc Stretch Limitations L runner's stretch w/ 2 beanbags under toes   assistance to avoid ankle eversion     Ankle Exercises: Standing   Tai Chi L dorsiflexion stretch on step 3x10" to tolerance    Other Standing Ankle Exercises L plantarflexion stretch 10x2" hold    discontinued d/t cramping in toes   Other Standing Ankle Exercises toe walk along counter x4, heel walk x2    c/o challenge with toe walking     Ankle Exercises: Seated   Heel Raises Left;15 reps   7# resting on knee   Other Seated Ankle Exercises L great toe MTP flexion with yellow TB under toe isometric 10x5" sitting, 10x5" standing with 2 ski poles  PT Education - 10/30/19 1057    Education Details omitted standing ankle plantarflexion stretch from HEP d/t poor tolerance    Person(s) Educated Patient    Methods Explanation    Comprehension Verbalized understanding            PT Short Term Goals - 10/26/19 1022      PT SHORT TERM GOAL #1   Title Patient to be independent with initial HEP.    Time 3    Period Weeks    Status Achieved    Target Date 10/25/19             PT Long Term Goals - 10/26/19 1023      PT LONG TERM GOAL #1   Title Patient to be independent with advanced HEP.    Time 6    Period Weeks    Status Partially Met   met for current HEP     PT LONG TERM GOAL #2   Title Patient to demonstrate 15 degree improvement in L great toe flexion/extension PROM.    Baseline L 1st digit flexion 7 deg, extension 25 deg    Time 6    Period Weeks    Status Achieved   08/05:  great toe extension PROM 40 dg, 28 dg     PT LONG TERM GOAL #3   Title Patient to demonstrate B LE strength >/=4+/5.    Time 6    Period Weeks    Status Partially Met   08/05: met for all with exception of L PF strength 3+/5 secondary to L toe pain and R hip extension pain     PT LONG TERM GOAL #4   Title Patient to report tolerance for 30 minutes of standing/walking without pain limiting.    Time 6    Period Weeks    Status On-going   08/05: notes 15 min standing/walking with aching pain in L toes up to 5/10     PT LONG TERM GOAL #5   Title Patient to report 70% improvement in frequency of L LE cramping.    Time 6    Period Weeks    Status Partially Met   08/05: Notes 60% HS cramping, 10% improvement in L calf cramping                Plan - 10/30/19 1058    Clinical Impression Statement Patient reporting that she had a negative venous doppler of the L LE and received a Cortisone injection to the L foot at her MD appointment on Friday. Notes that she did not have any LE muscle spasms over the weekend. Began session with HS and gastroc stretching with good tolerance, but with required assistance to avoid ankle eversion with gastroc stretching. Initiated heel/toe walking with patient demonstrating good ROM with heel walk, but imbalance and c/o challenge with toe walking. Plantarflexion stretching was discontinued d/t cramping in toes, and omitted this exercise from HEP d/t poor tolerance- patient reported understanding. Ended session without complaints. Patient seems to be improving in calf and HS muscle spasm pain, but still limited by foot tightness.    Comorbidities thyroid disease, HLD, hx breast CA with lumpectomy 2005, anxiety    Rehab Potential Good    PT Frequency 2x / week    PT Duration 6 weeks    PT Treatment/Interventions ADLs/Self Care Home Management;Cryotherapy;Electrical Stimulation;Moist Heat;Balance training;Therapeutic exercise;Therapeutic activities;Functional  mobility training;Stair training;Gait training;Ultrasound;Neuromuscular re-education;Patient/family education;Manual techniques;Vasopneumatic Device;Taping;Energy conservation;Dry needling;Passive range of motion;Scar mobilization  PT Next Visit Plan L HS stretching. STM to L calf, progress L ankle and great toe mobility    Consulted and Agree with Plan of Care Patient           Patient will benefit from skilled therapeutic intervention in order to improve the following deficits and impairments:  Abnormal gait, Hypomobility, Increased edema, Decreased scar mobility, Decreased activity tolerance, Decreased strength, Pain, Increased fascial restricitons, Decreased balance, Difficulty walking, Increased muscle spasms, Improper body mechanics, Decreased range of motion, Impaired flexibility, Postural dysfunction  Visit Diagnosis: Pain in left toe(s)  Stiffness of left foot, not elsewhere classified  Other muscle spasm  Difficulty in walking, not elsewhere classified  Other abnormalities of gait and mobility     Problem List Patient Active Problem List   Diagnosis Date Noted  . Arthritis of left acromioclavicular joint 09/19/2019  . Metatarsal deformity, left 05/19/2019  . Hallux limitus of left foot 04/11/2019  . Hammer toe of left foot 04/11/2019  . Low back pain 04/10/2019  . Numbness of hand 04/10/2019  . Shoulder pain 04/10/2019  . History of lobular carcinoma in situ (LCIS) of  left breast   . Anxiety 07/02/2017  . Hypothyroidism 07/02/2017  . Hyperlipidemia 07/02/2017  . Vitamin D deficiency 04/09/2016  . Osteopenia 03/05/2014  . Carpal tunnel syndrome 11/01/2013  . Depressive disorder 01/21/2010  . Polyp of colon 03/26/2009  . Malignant tumor of breast ([place]) 01/19/2008    Janene Harvey, PT, DPT 10/30/19 11:04 AM    [facility] [address]  [place], [place], [address] Phone: [phone]    Fax:  [phone]  Name: Elaine Buck MRN: [id number] Date of Birth: 1955-12-02

## 2019-10-31 ENCOUNTER — Encounter: Payer: Self-pay | Admitting: Gastroenterology

## 2019-10-31 DIAGNOSIS — E039 Hypothyroidism, unspecified: Secondary | ICD-10-CM

## 2019-10-31 DIAGNOSIS — F419 Anxiety disorder, unspecified: Secondary | ICD-10-CM

## 2019-10-31 DIAGNOSIS — E785 Hyperlipidemia, unspecified: Secondary | ICD-10-CM

## 2019-11-01 ENCOUNTER — Encounter: Payer: Self-pay | Admitting: Podiatry

## 2019-11-01 MED ORDER — LEVOTHYROXINE SODIUM 125 MCG PO TABS: 125.0000 ug | ORAL_TABLET | Freq: Every day | ORAL | 2 refills | Status: DC

## 2019-11-01 MED ORDER — VENLAFAXINE HCL ER 150 MG PO CP24: 150.0000 mg | ORAL_CAPSULE | Freq: Every day | ORAL | 2 refills | Status: DC

## 2019-11-01 MED ORDER — ATORVASTATIN CALCIUM 10 MG PO TABS: 10.0000 mg | ORAL_TABLET | Freq: Every day | ORAL | 3 refills | Status: DC

## 2019-11-02 ENCOUNTER — Ambulatory Visit: Payer: Medicare Other

## 2019-11-06 ENCOUNTER — Ambulatory Visit: Payer: Medicare Other

## 2019-11-06 ENCOUNTER — Other Ambulatory Visit: Payer: Self-pay

## 2019-11-06 DIAGNOSIS — R262 Difficulty in walking, not elsewhere classified: Secondary | ICD-10-CM | POA: Diagnosis not present

## 2019-11-06 DIAGNOSIS — M79675 Pain in left toe(s): Secondary | ICD-10-CM

## 2019-11-06 DIAGNOSIS — R2689 Other abnormalities of gait and mobility: Secondary | ICD-10-CM | POA: Diagnosis not present

## 2019-11-06 DIAGNOSIS — M25675 Stiffness of left foot, not elsewhere classified: Secondary | ICD-10-CM

## 2019-11-06 DIAGNOSIS — M62838 Other muscle spasm: Secondary | ICD-10-CM | POA: Diagnosis not present

## 2019-11-06 NOTE — Therapy (Signed)
[facility] [address]  [place], [place], [address] Phone: [phone]   Fax:  [phone]  Physical Therapy Progress Note  Patient Details  Name: Elaine Buck MRN: [id number] Date of Birth: Jul 24, 1955 Referring Provider (PT): Celesta Gentile, DPM   Progress Note Reporting Period 10/04/19 to 11/06/19  See note below for Objective Data and Assessment of Progress/Goals.     Encounter Date: 11/06/2019   PT End of Session - 11/06/19 1020    Visit Number 9    Number of Visits 17    Date for PT Re-Evaluation 12/04/19    Authorization Type UHC Medicare    PT Start Time 7681    PT Stop Time 1105    PT Time Calculation (min) 50 min    Activity Tolerance Patient tolerated treatment well;Patient limited by pain    Behavior During Therapy WFL for tasks assessed/performed           Past Medical History:  Diagnosis Date  . Anxiety   . Cancer ([place])    breast (Left)  . History of lobular carcinoma in situ (LCIS) of  left breast    s/p lumpectomy in 2005, Tamoxifen/Evista therapy for 5 years  . Hyperlipidemia   . Thyroid disease     Past Surgical History:  Procedure Laterality Date  . BREAST BIOPSY    . BREAST LUMPECTOMY Left 05/22/2003   LCIS Cells found in breast tissue  . FOOT SURGERY  04/2019    There were no vitals filed for this visit.   Subjective Assessment - 11/06/19 1019    Subjective Notes no benefit from recent cortisone injection.    Pertinent History thyroid disease, HLD, hx breast CA with lumpectomy 2005, anxiety    Diagnostic tests 08/04/19 L foot xray: bony resorptive changes at the second PIP joint whichmay be postsurgical/posttraumatic, or due to infection. Stable appearance of the first MTPJ arthroplasty    Patient Stated Goals "walk without pain and wear shoes without pain"              [facility] PT Assessment - 11/06/19 0001      Assessment   Medical Diagnosis Hallux limitus of  L foot, Hammer toe of L foot, L chronic foot pain, Capsulitis, L metatarsal deformity, s/p L foot surgery    Referring Provider (PT) Celesta Gentile, DPM    Onset Date/Surgical Date 05/03/19    Hand Dominance Left    Next MD Visit 11/24/19    Prior Therapy yes- s/p foot surgery      Observation/Other Assessments   Focus on Therapeutic Outcomes (FOTO)  69% (31% limitation      PROM   Overall PROM Comments L 1st digit flexion 35 deg, extension 47 deg      Strength   Strength Assessment Site Hip;Knee;Ankle    Right/Left Hip Right;Left    Right Hip Flexion 5/5    Right Hip Extension 4+/5    Right Hip External Rotation  4+/5    Right Hip Internal Rotation 4+/5    Right Hip ABduction 4+/5    Right Hip ADduction 4+/5    Left Hip Flexion 4+/5    Left Hip Extension 4+/5    Left Hip External Rotation 4+/5    Left Hip Internal Rotation 4+/5    Left Hip ABduction 4+/5    Left Hip ADduction 4+/5    Right/Left Knee Right;Left    Right Knee Flexion 5/5    Right Knee Extension  5/5    Left Knee Flexion 5/5    Left Knee Extension 5/5    Right/Left Ankle Right;Left    Right Ankle Dorsiflexion 5/5    Right Ankle Plantar Flexion 5/5    Right Ankle Inversion 5/5    Right Ankle Eversion 5/5    Left Ankle Dorsiflexion 5/5    Left Ankle Plantar Flexion 4/5    Left Ankle Inversion 5/5    Left Ankle Eversion 5/5                         [facility] Adult PT Treatment/Exercise - 11/06/19 0001      Knee/Hip Exercises: Aerobic   Stationary Bike L2 x 8 min      Manual Therapy   Soft tissue mobilization STM/DTM and IASTM to L plantar surface of foot                     PT Short Term Goals - 10/26/19 1022      PT SHORT TERM GOAL #1   Title Patient to be independent with initial HEP.    Time 3    Period Weeks    Status Achieved    Target Date 10/25/19             PT Long Term Goals - 11/06/19 1022      PT LONG TERM GOAL #1   Title Patient to be independent with  advanced HEP.    Time 6    Period Weeks    Status Partially Met   met for current HEP   Target Date 12/04/19      PT LONG TERM GOAL #2   Title Patient to demonstrate 15 degree improvement in L great toe flexion/extension PROM.    Baseline L 1st digit flexion 7 deg, extension 25 deg    Time 6    Period Weeks    Status Achieved   11/06/19: great toe extension PROM 47 dg, flexion PROM 35 dg   Target Date 12/04/19      PT LONG TERM GOAL #3   Title Patient to demonstrate B LE strength >/=4+/5.    Time 6    Period Weeks    Status Partially Met   11/06/19: met for all with exception of L PF strength of 4/5   Target Date 12/04/19      PT LONG TERM GOAL #4   Title Patient to report tolerance for 30 minutes of standing/walking without pain limiting.    Time 6    Period Weeks    Status Partially Met   11/06/19: 30 min standing met not met for walking   Target Date 12/04/19      PT LONG TERM GOAL #5   Title Patient to report 70% improvement in frequency of L LE cramping.    Time 6    Period Weeks    Status Partially Met   11/06/19:  notes 50-60% improvement in L LE cramping   Target Date 12/04/19                 Plan - 11/06/19 1028    Clinical Impression Statement Pt. has met LTG #2 demonstrating much improved L great toe PROM flexion/extension and with improved tolerance for these end ROM extremes today.  Demonstrated improved B hip extension strength with MMT nearly achieving LTG #3 met for all with exception of 4/5 L PF strength.  Notes improved tolerance for standing to  30 min partially achieving LT #4.  Still limited to <30 min walking tolerance due to L foot discomfort.  Pt. notes 50-60% improvement in L LE cramping since starting therapy.  LTG #5 partially achieved.  Some improved tissues quality noted with MT today to plantar arch and apparent with great toe mobility however still limited mobility noted in L great toe evident with heel raise.  Pt. reporting she feels she is  progressing with PT and wishes to continue.  Supervising PT approving recertification for additional 2x/week for 4 weeks based on aforementioned deficits.    Comorbidities thyroid disease, HLD, hx breast CA with lumpectomy 2005, anxiety    Rehab Potential Good    PT Frequency 2x / week    PT Duration 4 weeks    PT Treatment/Interventions ADLs/Self Care Home Management;Cryotherapy;Electrical Stimulation;Moist Heat;Balance training;Therapeutic exercise;Therapeutic activities;Functional mobility training;Stair training;Gait training;Ultrasound;Neuromuscular re-education;Patient/family education;Manual techniques;Vasopneumatic Device;Taping;Energy conservation;Dry needling;Passive range of motion;Scar mobilization    PT Next Visit Plan L HS stretching. STM to L calf, progress L ankle and great toe mobility    Consulted and Agree with Plan of Care Patient           Patient will benefit from skilled therapeutic intervention in order to improve the following deficits and impairments:  Abnormal gait, Hypomobility, Increased edema, Decreased scar mobility, Decreased activity tolerance, Decreased strength, Pain, Increased fascial restricitons, Decreased balance, Difficulty walking, Increased muscle spasms, Improper body mechanics, Decreased range of motion, Impaired flexibility, Postural dysfunction  Visit Diagnosis: Pain in left toe(s)  Stiffness of left foot, not elsewhere classified  Other muscle spasm  Difficulty in walking, not elsewhere classified  Other abnormalities of gait and mobility     Problem List Patient Active Problem List   Diagnosis Date Noted  . Arthritis of left acromioclavicular joint 09/19/2019  . Metatarsal deformity, left 05/19/2019  . Hallux limitus of left foot 04/11/2019  . Hammer toe of left foot 04/11/2019  . Low back pain 04/10/2019  . Numbness of hand 04/10/2019  . Shoulder pain 04/10/2019  . History of lobular carcinoma in situ (LCIS) of  left breast   .  Anxiety 07/02/2017  . Hypothyroidism 07/02/2017  . Hyperlipidemia 07/02/2017  . Vitamin D deficiency 04/09/2016  . Osteopenia 03/05/2014  . Carpal tunnel syndrome 11/01/2013  . Depressive disorder 01/21/2010  . Polyp of colon 03/26/2009  . Malignant tumor of breast [facility]) 01/19/2008    Bess Harvest, PTA 11/06/19 11:47 AM    [facility] [address]  [place], [place], [address] Phone: [phone]   Fax:  [phone]  Name: Kamalani Mastro MRN: [id number] Date of Birth: [birth year]   Patient is demonstrating good improvement in L great toe mobility and LE strength. This improvement is reflected in her subjective report of improved standing tolerance and decreased LE muscle spasms. D/t patient's continued pain and limited functional activity tolerance, would benefit from continued skilled PT services 2x/week for 4 weeks to address remaining goals.   Janene Harvey, PT, DPT 11/06/19 11:47 AM

## 2019-11-09 ENCOUNTER — Ambulatory Visit: Payer: Medicare Other

## 2019-11-09 ENCOUNTER — Other Ambulatory Visit: Payer: Self-pay

## 2019-11-09 DIAGNOSIS — M79675 Pain in left toe(s): Secondary | ICD-10-CM | POA: Diagnosis not present

## 2019-11-09 DIAGNOSIS — R262 Difficulty in walking, not elsewhere classified: Secondary | ICD-10-CM | POA: Diagnosis not present

## 2019-11-09 DIAGNOSIS — M62838 Other muscle spasm: Secondary | ICD-10-CM | POA: Diagnosis not present

## 2019-11-09 DIAGNOSIS — R2689 Other abnormalities of gait and mobility: Secondary | ICD-10-CM | POA: Diagnosis not present

## 2019-11-09 DIAGNOSIS — M25675 Stiffness of left foot, not elsewhere classified: Secondary | ICD-10-CM | POA: Diagnosis not present

## 2019-11-09 NOTE — Therapy (Signed)
[facility] [address]  [place], [place], [address] Phone: [phone]   Fax:  [phone]  Physical Therapy Treatment  Patient Details  Name: Elaine Buck MRN: [id number] Date of Birth: [birth year] Referring Provider (PT): Celesta Gentile, DPM   Encounter Date: 11/09/2019   PT End of Session - 11/09/19 1024    Visit Number 10    Number of Visits 17    Date for PT Re-Evaluation 12/04/19    Authorization Type UHC Medicare    PT Start Time 1017    PT Stop Time 1100    PT Time Calculation (min) 43 min    Activity Tolerance Patient tolerated treatment well;Patient limited by pain    Behavior During Therapy [facility] for tasks assessed/performed           Past Medical History:  Diagnosis Date  . Anxiety   . Cancer ([place])    breast (Left)  . History of lobular carcinoma in situ (LCIS) of  left breast    s/p lumpectomy in 2005, Tamoxifen/Evista therapy for 5 years  . Hyperlipidemia   . Thyroid disease     Past Surgical History:  Procedure Laterality Date  . BREAST BIOPSY    . BREAST LUMPECTOMY Left 05/22/2003   LCIS Cells found in breast tissue  . FOOT SURGERY  04/2019    There were no vitals filed for this visit.   Subjective Assessment - 11/09/19 1024    Subjective Pt. doing well.  denies soreness after last sessoin.  was able to walk 10 min 1/2 mile without pain limiting.    Pertinent History thyroid disease, HLD, hx breast CA with lumpectomy 2005, anxiety    Diagnostic tests 08/04/19 L foot xray: bony resorptive changes at the second PIP joint whichmay be postsurgical/posttraumatic, or due to infection. Stable appearance of the first MTPJ arthroplasty    Patient Stated Goals "walk without pain and wear shoes without pain"    Currently in Pain? No/denies    Pain Score 0-No pain    Pain Location Leg                             [facility] Adult PT Treatment/Exercise - 11/09/19 0001       Neuro Re-ed    Neuro Re-ed Details  Alternating cone knock over/righting 2 x 7 cones each;  narrow stance eyes closed 2 x 10 sec; staggered stance each way 2 x 10 sec eyes closed; therapist supervision       Knee/Hip Exercises: Stretches   Passive Hamstring Stretch Left;2 reps;30 seconds    Passive Hamstring Stretch Limitations + GS supine with strap      Knee/Hip Exercises: Aerobic   Stationary Bike L2 x 7 min      Ankle Exercises: Standing   SLS L SLS 2 x 15 sec     Heel Raises Both;10 reps;Left   2 sets    Heel Raises Limitations B con/L 75% weight bearing on eccentric; negative at UBE      Ankle Exercises: Stretches   Soleus Stretch 2 reps;30 seconds    Soleus Stretch Limitations runners stretch     Gastroc Stretch 2 reps;30 seconds    Gastroc Stretch Limitations runners stretch                   PT Education - 11/09/19 1111    Education Details HEP update; B con/L eccentric  75% nagative heel raise    Person(s) Educated Patient    Methods Explanation;Demonstration;Verbal cues;Handout    Comprehension Verbalized understanding;Returned demonstration;Verbal cues required            PT Short Term Goals - 10/26/19 1022      PT SHORT TERM GOAL #1   Title Patient to be independent with initial HEP.    Time 3    Period Weeks    Status Achieved    Target Date 10/25/19             PT Long Term Goals - 11/09/19 1046      PT LONG TERM GOAL #1   Title Patient to be independent with advanced HEP.    Time 6    Period Weeks    Status Partially Met   met for current HEP     PT LONG TERM GOAL #2   Title Patient to demonstrate 15 degree improvement in L great toe flexion/extension PROM.    Baseline L 1st digit flexion 7 deg, extension 25 deg    Time 6    Period Weeks    Status Achieved   11/06/19: great toe extension PROM 47 dg, flexion PROM 35 dg     PT LONG TERM GOAL #3   Title Patient to demonstrate B LE strength >/=4+/5.    Time 6    Period Weeks     Status Partially Met   11/06/19: met for all with exception of L PF strength of 4/5     PT LONG TERM GOAL #4   Title Patient to report tolerance for 30 minutes of standing/walking without pain limiting.    Time 6    Period Weeks    Status Partially Met   11/06/19: 30 min standing met not met for walking; only 10 min tolerance for walking     PT LONG TERM GOAL #5   Title Patient to report 70% improvement in frequency of L LE cramping.    Time 6    Period Weeks    Status Partially Met   11/06/19:  notes 50-60% improvement in L LE cramping                Plan - 11/09/19 1025    Clinical Impression Statement [name] doing well.  Notes she was able to walk 10 min in neighborhood without limitation due to L foot/ankle pain.  Progressing toward LTG #4.  MT addressing ongoing soft tissue restriction in L medial gastroc musculature.  Initiated L ankle stability/balance training which was tolerated well.  Progressed L calf strengthening without issue primarily limited now by L great toe extension ROM.  Ended visit pain free.  HEP updated.    Comorbidities thyroid disease, HLD, hx breast CA with lumpectomy 2005, anxiety    Rehab Potential Good    PT Frequency 2x / week    PT Treatment/Interventions ADLs/Self Care Home Management;Cryotherapy;Electrical Stimulation;Moist Heat;Balance training;Therapeutic exercise;Therapeutic activities;Functional mobility training;Stair training;Gait training;Ultrasound;Neuromuscular re-education;Patient/family education;Manual techniques;Vasopneumatic Device;Taping;Energy conservation;Dry needling;Passive range of motion;Scar mobilization    PT Next Visit Plan L HS stretching. STM to L calf, progress L ankle and great toe mobility    Consulted and Agree with Plan of Care Patient           Patient will benefit from skilled therapeutic intervention in order to improve the following deficits and impairments:  Abnormal gait, Hypomobility, Increased edema,  Decreased scar mobility, Decreased activity tolerance, Decreased strength, Pain, Increased fascial restricitons, Decreased balance, Difficulty  walking, Increased muscle spasms, Improper body mechanics, Decreased range of motion, Impaired flexibility, Postural dysfunction  Visit Diagnosis: Pain in left toe(s)  Stiffness of left foot, not elsewhere classified  Other muscle spasm  Difficulty in walking, not elsewhere classified  Other abnormalities of gait and mobility     Problem List Patient Active Problem List   Diagnosis Date Noted  . Arthritis of left acromioclavicular joint 09/19/2019  . Metatarsal deformity, left 05/19/2019  . Hallux limitus of left foot 04/11/2019  . Hammer toe of left foot 04/11/2019  . Low back pain 04/10/2019  . Numbness of hand 04/10/2019  . Shoulder pain 04/10/2019  . History of lobular carcinoma in situ (LCIS) of  left breast   . Anxiety 07/02/2017  . Hypothyroidism 07/02/2017  . Hyperlipidemia 07/02/2017  . Vitamin D deficiency 04/09/2016  . Osteopenia 03/05/2014  . Carpal tunnel syndrome 11/01/2013  . Depressive disorder 01/21/2010  . Polyp of colon 03/26/2009  . Malignant tumor of breast [facility]) 01/19/2008    Bess Harvest, PTA 11/09/19 11:22 AM   [facility] [address]  [place], [place], [address] Phone: [phone]   Fax:  [phone]  Name: Elaine Buck MRN: [id number] Date of Birth: 05-08-1955

## 2019-11-15 ENCOUNTER — Other Ambulatory Visit: Payer: Self-pay

## 2019-11-15 ENCOUNTER — Ambulatory Visit: Payer: Medicare Other | Admitting: Physical Therapy

## 2019-11-15 ENCOUNTER — Encounter: Payer: Self-pay | Admitting: Physical Therapy

## 2019-11-15 DIAGNOSIS — M25675 Stiffness of left foot, not elsewhere classified: Secondary | ICD-10-CM

## 2019-11-15 DIAGNOSIS — M79675 Pain in left toe(s): Secondary | ICD-10-CM

## 2019-11-15 DIAGNOSIS — R262 Difficulty in walking, not elsewhere classified: Secondary | ICD-10-CM | POA: Diagnosis not present

## 2019-11-15 DIAGNOSIS — M62838 Other muscle spasm: Secondary | ICD-10-CM

## 2019-11-15 DIAGNOSIS — R2689 Other abnormalities of gait and mobility: Secondary | ICD-10-CM | POA: Diagnosis not present

## 2019-11-15 NOTE — Therapy (Signed)
[facility] [address]  [place], [place], [address] Phone: [phone]   Fax:  [phone]  Physical Therapy Treatment  Patient Details  Name: Elaine Buck MRN: [id number] Date of Birth: 04/30/1955 Referring Provider (PT): Celesta Gentile, DPM   Encounter Date: 11/15/2019   PT End of Session - 11/15/19 1008    Visit Number 11    Number of Visits 17    Date for PT Re-Evaluation 12/04/19    Authorization Type UHC Medicare    PT Start Time 0921    PT Stop Time 1007    PT Time Calculation (min) 46 min    Activity Tolerance Patient tolerated treatment well    Behavior During Therapy [facility] for tasks assessed/performed           Past Medical History:  Diagnosis Date  . Anxiety   . Cancer ([place])    breast (Left)  . History of lobular carcinoma in situ (LCIS) of  left breast    s/p lumpectomy in 2005, Tamoxifen/Evista therapy for 5 years  . Hyperlipidemia   . Thyroid disease     Past Surgical History:  Procedure Laterality Date  . BREAST BIOPSY    . BREAST LUMPECTOMY Left 05/22/2003   LCIS Cells found in breast tissue  . FOOT SURGERY  04/2019    There were no vitals filed for this visit.   Subjective Assessment - 11/15/19 0922    Subjective Doing well. Has a nerve/skin biopsy on Friday. Muscle spasms are getting less frequent- strill struggling with N/T and tightness in the toes.    Pertinent History thyroid disease, HLD, hx breast CA with lumpectomy 2005, anxiety    Diagnostic tests 08/04/19 L foot xray: bony resorptive changes at the second PIP joint whichmay be postsurgical/posttraumatic, or due to infection. Stable appearance of the first MTPJ arthroplasty    Patient Stated Goals "walk without pain and wear shoes without pain"    Currently in Pain? No/denies                             [facility] Adult PT Treatment/Exercise - 11/15/19 0001      Knee/Hip Exercises: Aerobic    Recumbent Bike L2 x 24mn       Manual Therapy   Manual Therapy Joint mobilization;Soft tissue mobilization;Myofascial release;Passive ROM    Manual therapy comments prone    Joint Mobilization L great toe MTP distraction to improve tolerance for PROM, L great toe MTP PA and AP mobs for flexion and extension    Soft tissue mobilization STM/DTM and IASTM to L plantar surface of foot and L medial gastroc    Myofascial Release manual TPR to L medial plantar arch, medial gastroc    Passive ROM Manual L great toe extension, flexion stretch to tolerance, L ankle dorsiflexion + toe extension 3x30" to tolerance      Ankle Exercises: Seated   Heel Raises Left;10 reps;5 seconds   cues to maintain MTP's on floor     Ankle Exercises: Standing   Other Standing Ankle Exercises weight shift forward 15x to challenge limits of stability; L SLS with R toes down on airex 5x20"    Other Standing Ankle Exercises L great toe extension stretch against 10# dumbbell 5x10"; performed with other toes on L foot 5x10" to tolerance                  PT  Education - 11/15/19 1007    Education Details encouraged patient to continue L great toe flexion self-stretching at home, edu on DN benefits and precautions    Person(s) Educated Patient    Methods Explanation;Demonstration;Tactile cues    Comprehension Verbalized understanding            PT Short Term Goals - 10/26/19 1022      PT SHORT TERM GOAL #1   Title Patient to be independent with initial HEP.    Time 3    Period Weeks    Status Achieved    Target Date 10/25/19             PT Long Term Goals - 11/09/19 1046      PT LONG TERM GOAL #1   Title Patient to be independent with advanced HEP.    Time 6    Period Weeks    Status Partially Met   met for current HEP     PT LONG TERM GOAL #2   Title Patient to demonstrate 15 degree improvement in L great toe flexion/extension PROM.    Baseline L 1st digit flexion 7 deg, extension 25 deg     Time 6    Period Weeks    Status Achieved   11/06/19: great toe extension PROM 47 dg, flexion PROM 35 dg     PT LONG TERM GOAL #3   Title Patient to demonstrate B LE strength >/=4+/5.    Time 6    Period Weeks    Status Partially Met   11/06/19: met for all with exception of L PF strength of 4/5     PT LONG TERM GOAL #4   Title Patient to report tolerance for 30 minutes of standing/walking without pain limiting.    Time 6    Period Weeks    Status Partially Met   11/06/19: 30 min standing met not met for walking; only 10 min tolerance for walking     PT LONG TERM GOAL #5   Title Patient to report 70% improvement in frequency of L LE cramping.    Time 6    Period Weeks    Status Partially Met   11/06/19:  notes 50-60% improvement in L LE cramping                Plan - 11/15/19 1008    Clinical Impression Statement Patient reporting improvement in frequency of muscle spasms, but still struggling with N/T and tightness in the toes. Thus, worked on manual therapy to address soft tissue restriction, ROM, and joint mobility. Patient still with considerable trigger points in the medial plantar aspect of the foot as well as in the medial gastroc. ROM seems to be improving in great toe extension, but still limited in flexion. Worked on addressing great-toe WBing tolerance as well as self-stretching. Encouraged patient to continued great toe flexion self-stretching to continue progressing ROM in this plane. Patient reported understanding and without complaints at end of session.    Comorbidities thyroid disease, HLD, hx breast CA with lumpectomy 2005, anxiety    Rehab Potential Good    PT Frequency 2x / week    PT Treatment/Interventions ADLs/Self Care Home Management;Cryotherapy;Electrical Stimulation;Moist Heat;Balance training;Therapeutic exercise;Therapeutic activities;Functional mobility training;Stair training;Gait training;Ultrasound;Neuromuscular re-education;Patient/family  education;Manual techniques;Vasopneumatic Device;Taping;Energy conservation;Dry needling;Passive range of motion;Scar mobilization    PT Next Visit Plan DN to L medial gastroc, lateral/medial HS, and medial plantar surface of foot? L HS stretching. STM to L calf, progress L ankle  and great toe mobility    Consulted and Agree with Plan of Care Patient           Patient will benefit from skilled therapeutic intervention in order to improve the following deficits and impairments:  Abnormal gait, Hypomobility, Increased edema, Decreased scar mobility, Decreased activity tolerance, Decreased strength, Pain, Increased fascial restricitons, Decreased balance, Difficulty walking, Increased muscle spasms, Improper body mechanics, Decreased range of motion, Impaired flexibility, Postural dysfunction  Visit Diagnosis: Pain in left toe(s)  Stiffness of left foot, not elsewhere classified  Other muscle spasm  Difficulty in walking, not elsewhere classified  Other abnormalities of gait and mobility     Problem List Patient Active Problem List   Diagnosis Date Noted  . Arthritis of left acromioclavicular joint 09/19/2019  . Metatarsal deformity, left 05/19/2019  . Hallux limitus of left foot 04/11/2019  . Hammer toe of left foot 04/11/2019  . Low back pain 04/10/2019  . Numbness of hand 04/10/2019  . Shoulder pain 04/10/2019  . History of lobular carcinoma in situ (LCIS) of  left breast   . Anxiety 07/02/2017  . Hypothyroidism 07/02/2017  . Hyperlipidemia 07/02/2017  . Vitamin D deficiency 04/09/2016  . Osteopenia 03/05/2014  . Carpal tunnel syndrome 11/01/2013  . Depressive disorder 01/21/2010  . Polyp of colon 03/26/2009  . Malignant tumor of breast ([place]) 01/19/2008     Janene Harvey, PT, DPT 11/15/19 10:12 AM   [facility] [address]  [place], [place], [address] Phone: [phone]   Fax:   [phone]  Name: Elaine Buck MRN: [id number] Date of Birth: 04/03/1955

## 2019-11-15 NOTE — Patient Instructions (Signed)

## 2019-11-17 ENCOUNTER — Ambulatory Visit: Payer: Medicare Other | Admitting: Podiatry

## 2019-11-17 ENCOUNTER — Other Ambulatory Visit: Payer: Self-pay

## 2019-11-17 ENCOUNTER — Encounter: Payer: Self-pay | Admitting: Podiatry

## 2019-11-17 VITALS — Temp 96.8°F

## 2019-11-17 DIAGNOSIS — R252 Cramp and spasm: Secondary | ICD-10-CM

## 2019-11-17 DIAGNOSIS — M792 Neuralgia and neuritis, unspecified: Secondary | ICD-10-CM | POA: Diagnosis not present

## 2019-11-18 NOTE — Progress Notes (Signed)
Subjective: 64 year old female presents the office today for nerve biopsy.  She states that she still having discomfort to her foot.  Physical therapy is been helpful reports the cramping however she still gets tingling, burning to the top of her foot which originates in the ankle going distally.  This was ongoing prior to the surgery that we performed in previous nerve conduction test did not reveal significant pathology.  Therefore we discussed nerve biopsy. Denies any systemic complaints such as fevers, chills, nausea, vomiting. No acute changes since last appointment, and no other complaints at this time.   Objective: AAO x3, NAD DP/PT pulses palpable bilaterally, CRT less than 3 seconds Prominence of the foot more complex noted and tenderness around the sesamoid complex.  She started discomfort on the course the deep peroneal nerve and radiating pain into the foot. No pain with calf compression, swelling, warmth, erythema  Assessment: Sesamoiditis, neuritis  Plan: -All treatment options discussed with the patient including all alternatives, risks, complications. -We discussed pros and cons of doing a nerve biopsy at this point given her nerve symptoms I recommended to proceed with a nerve biopsy.  She understands the risks and consent obtained.  Approximate 10 cm proximal to the lateral malleolus 1 cc bupivacaine with epinephrine was infiltrated just proximal to the punch biopsy sites after the skin was cleaned with alcohol.  The skin was then cleaned Betadine and punch biopsies were performed on both sides left and the right following standard technique and the specimens were then processed as directed.  The wounds were irrigated with alcohol and antibiotic ointment and dressing applied.  She tolerated procedure well or complications.  Post procedure instructions discussed.  [name]  -We will continue to monitor better.  However injection the area that she is having symptomatic  course the deep peroneal nerve.  I did perform a steroid injection on this area today with care to avoid the dorsalis pedis artery.  Skin was cleaned with alcohol and a mixture of half cc dexamethasone phosphate and half cc Marcaine plain was infiltrated into the area no complications.  Postinjection care discussed. -Continue physical therapy, orthotics and supportive shoes. -Venous duplex ordered given chronic leg cramping in the calf although suspicion low. -Discussed possible nerve conduction test given the nerve symptoms which seems to be ongoing prior to the surgery as well seems to be worsening   [name]

## 2019-11-21 ENCOUNTER — Other Ambulatory Visit: Payer: Self-pay

## 2019-11-21 ENCOUNTER — Ambulatory Visit: Payer: Medicare Other | Admitting: Physical Therapy

## 2019-11-21 ENCOUNTER — Encounter: Payer: Self-pay | Admitting: Physical Therapy

## 2019-11-21 DIAGNOSIS — M62838 Other muscle spasm: Secondary | ICD-10-CM | POA: Diagnosis not present

## 2019-11-21 DIAGNOSIS — R262 Difficulty in walking, not elsewhere classified: Secondary | ICD-10-CM

## 2019-11-21 DIAGNOSIS — M25675 Stiffness of left foot, not elsewhere classified: Secondary | ICD-10-CM | POA: Diagnosis not present

## 2019-11-21 DIAGNOSIS — R2689 Other abnormalities of gait and mobility: Secondary | ICD-10-CM

## 2019-11-21 DIAGNOSIS — M79675 Pain in left toe(s): Secondary | ICD-10-CM

## 2019-11-21 NOTE — Therapy (Addendum)
[facility] [address]  [place], [place], [address] Phone: [phone]   Fax:  [phone]  Physical Therapy Treatment  Patient Details  Name: Elaine Buck MRN: [id number] Date of Birth: [birth year] Referring Provider (PT): Celesta Gentile, DPM   Encounter Date: 11/21/2019   PT End of Session - 11/21/19 1015    Visit Number 12    Number of Visits 17    Date for PT Re-Evaluation 12/04/19    Authorization Type UHC Medicare    PT Start Time 1010    PT Stop Time 1056    PT Time Calculation (min) 46 min    Activity Tolerance Patient tolerated treatment well    Behavior During Therapy [facility] for tasks assessed/performed           Past Medical History:  Diagnosis Date  . Anxiety   . Cancer ([place])    breast (Left)  . History of lobular carcinoma in situ (LCIS) of  left breast    s/p lumpectomy in 2005, Tamoxifen/Evista therapy for 5 years  . Hyperlipidemia   . Thyroid disease     Past Surgical History:  Procedure Laterality Date  . BREAST BIOPSY    . BREAST LUMPECTOMY Left 05/22/2003   LCIS Cells found in breast tissue  . FOOT SURGERY  04/2019    There were no vitals filed for this visit.   Subjective Assessment - 11/21/19 1011    Subjective Had a "punch biopsy" on Friday but will not be receiving results for 1-5 weeks. MD highly recommended DN to the plantar surface of the foot to assist with inflammation. Will be receiving an injection to the sesamoid bone next week.    Pertinent History thyroid disease, HLD, hx breast CA with lumpectomy 2005, anxiety    Diagnostic tests 08/04/19 L foot xray: bony resorptive changes at the second PIP joint whichmay be postsurgical/posttraumatic, or due to infection. Stable appearance of the first MTPJ arthroplasty    Patient Stated Goals "walk without pain and wear shoes without pain"    Currently in Pain? No/denies                             [facility]  Adult PT Treatment/Exercise - 11/21/19 0001      Knee/Hip Exercises: Aerobic   Recumbent Bike L2 x 65mn       Manual Therapy   Manual Therapy Soft tissue mobilization;Myofascial release    Manual therapy comments skilled palpation and monitoring during DN - supine & prone    Soft tissue mobilization STM/DTM to L plantar muscles, flexor digitorum longus & brevis, medial gastroc/soleus and peroneals    Myofascial Release manual TPR to L medial plantar arch, medial gastroc & peroneals      Ankle Exercises: Aerobic   Recumbent Bike L2 x 6 min      Ankle Exercises: Standing   SLS L SLS 2x30" on foam   intermittent UE support on counter   Heel Raises Both;10 reps    Heel Raises Limitations 2x10; 1st set with knees bent; 2nd set B conc, Lecc      Ankle Exercises: Stretches   Soleus Stretch 2 reps;30 seconds    Soleus Stretch Limitations runners stretch    cues to maintain toes forward   Gastroc Stretch 30 seconds;4 reps   before and after DN   Gastroc Stretch Limitations L LE; toes on wall  Trigger Point Dry Needling - 11/21/19 0001    Consent Given? Yes    Education Handout Provided Previously provided    Muscles Treated Lower Quadrant Gastrocnemius;Soleus;Flexor Digitorum Longus;Peroneals   Lt   Other Dry Needling Lt flexor digitorum brevis - strong twitch response + palp reduction in muscle tension    Peroneals Response Twitch response elicited;Palpable increased muscle length   Lt peroneous longus   Gastrocnemius Response Twitch response elicited;Palpable increased muscle length   L medial gastroc   Soleus Response Twitch response elicited;Palpable increased muscle length    Flexor digitorum longus Response Twitch response elicited;Palpable increased muscle length                PT Education - 11/21/19 1058    Education Details edu on post-DN activities/expectations    Person(s) Educated Patient    Methods Explanation    Comprehension Verbalized understanding             PT Short Term Goals - 10/26/19 1022      PT SHORT TERM GOAL #1   Title Patient to be independent with initial HEP.    Time 3    Period Weeks    Status Achieved    Target Date 10/25/19             PT Long Term Goals - 11/09/19 1046      PT LONG TERM GOAL #1   Title Patient to be independent with advanced HEP.    Time 6    Period Weeks    Status Partially Met   met for current HEP     PT LONG TERM GOAL #2   Title Patient to demonstrate 15 degree improvement in L great toe flexion/extension PROM.    Baseline L 1st digit flexion 7 deg, extension 25 deg    Time 6    Period Weeks    Status Achieved   11/06/19: great toe extension PROM 47 dg, flexion PROM 35 dg     PT LONG TERM GOAL #3   Title Patient to demonstrate B LE strength >/=4+/5.    Time 6    Period Weeks    Status Partially Met   11/06/19: met for all with exception of L PF strength of 4/5     PT LONG TERM GOAL #4   Title Patient to report tolerance for 30 minutes of standing/walking without pain limiting.    Time 6    Period Weeks    Status Partially Met   11/06/19: 30 min standing met not met for walking; only 10 min tolerance for walking     PT LONG TERM GOAL #5   Title Patient to report 70% improvement in frequency of L LE cramping.    Time 6    Period Weeks    Status Partially Met   11/06/19:  notes 50-60% improvement in L LE cramping                Plan - 11/21/19 1015    Clinical Impression Statement Patient notes undergoing a "punch biopsy" to B ankles on Friday but not yet notified of results. Also notes that MD highly recommended DN to the plantar surface of the foot "to help with the inflammation." Patient tolerated DN without issues and reported understanding of post-DN education. Worked on gentle calf stretching with minor cueing to maintain proper form. SLS balance training was performed on foam with patient demonstrating considerable ankle instability and posterior weight  shift. Cueing provided to  shift weight over toes and to contract hips and core, resulting in improved stability. Patient without complaints at end of session. Progressing well towards goals.    Comorbidities thyroid disease, HLD, hx breast CA with lumpectomy 2005, anxiety    Rehab Potential Good    PT Frequency 2x / week    PT Treatment/Interventions ADLs/Self Care Home Management;Cryotherapy;Electrical Stimulation;Moist Heat;Balance training;Therapeutic exercise;Therapeutic activities;Functional mobility training;Stair training;Gait training;Ultrasound;Neuromuscular re-education;Patient/family education;Manual techniques;Vasopneumatic Device;Taping;Energy conservation;Dry needling;Passive range of motion;Scar mobilization    PT Next Visit Plan DN to L medial gastroc, lateral/medial HS, and medial plantar surface of foot? L HS stretching. STM to L calf, progress L ankle and great toe mobility    Consulted and Agree with Plan of Care Patient           Patient will benefit from skilled therapeutic intervention in order to improve the following deficits and impairments:  Abnormal gait, Hypomobility, Increased edema, Decreased scar mobility, Decreased activity tolerance, Decreased strength, Pain, Increased fascial restricitons, Decreased balance, Difficulty walking, Increased muscle spasms, Improper body mechanics, Decreased range of motion, Impaired flexibility, Postural dysfunction  Visit Diagnosis: Pain in left toe(s)  Stiffness of left foot, not elsewhere classified  Other muscle spasm  Difficulty in walking, not elsewhere classified  Other abnormalities of gait and mobility     Problem List Patient Active Problem List   Diagnosis Date Noted  . Arthritis of left acromioclavicular joint 09/19/2019  . Metatarsal deformity, left 05/19/2019  . Hallux limitus of left foot 04/11/2019  . Hammer toe of left foot 04/11/2019  . Low back pain 04/10/2019  . Numbness of hand 04/10/2019  .  Shoulder pain 04/10/2019  . History of lobular carcinoma in situ (LCIS) of  left breast   . Anxiety 07/02/2017  . Hypothyroidism 07/02/2017  . Hyperlipidemia 07/02/2017  . Vitamin D deficiency 04/09/2016  . Osteopenia 03/05/2014  . Carpal tunnel syndrome 11/01/2013  . Depressive disorder 01/21/2010  . Polyp of colon 03/26/2009  . Malignant tumor of breast ([place]) 01/19/2008    Janene Harvey, PT, DPT 11/21/19 11:00 AM   [facility] [address]  [place], [place], [address] Phone: [phone]   Fax:  [phone]  Name: Elaine Buck MRN: [id number] Date of Birth: [birth year]   Dry needling performed upon informed patient consent in conjunction with manual therapy to L distal LE muscles - L flexor digitorum longus and brevis, L medial gastroc and soleus and L peroneus longus. Good twitch responses elicited, esp in flexor digitorum brevis and medial gastroc, with palpable reduction in muscle tension. Provided education in recommended post-treatment activity to maximize benefit from DN and minimize muscle soreness.  Percival Spanish, PT, MPT 11/21/19, 11:05 AM  [facility] [address]  [place], [place], [address] Phone: [phone]   Fax:  [phone]

## 2019-11-24 ENCOUNTER — Other Ambulatory Visit: Payer: Self-pay

## 2019-11-24 ENCOUNTER — Ambulatory Visit (INDEPENDENT_AMBULATORY_CARE_PROVIDER_SITE_OTHER): Payer: Medicare Other | Admitting: Podiatry

## 2019-11-24 ENCOUNTER — Encounter: Payer: Self-pay | Admitting: Podiatry

## 2019-11-24 VITALS — Temp 97.3°F

## 2019-11-24 DIAGNOSIS — M79672 Pain in left foot: Secondary | ICD-10-CM

## 2019-11-24 DIAGNOSIS — M2580 Other specified joint disorders, unspecified joint: Secondary | ICD-10-CM | POA: Diagnosis not present

## 2019-11-28 ENCOUNTER — Ambulatory Visit: Payer: Medicare Other | Attending: Podiatry | Admitting: Physical Therapy

## 2019-11-28 ENCOUNTER — Encounter: Payer: Self-pay | Admitting: Physical Therapy

## 2019-11-28 ENCOUNTER — Other Ambulatory Visit: Payer: Self-pay

## 2019-11-28 DIAGNOSIS — M62838 Other muscle spasm: Secondary | ICD-10-CM | POA: Diagnosis not present

## 2019-11-28 DIAGNOSIS — R2689 Other abnormalities of gait and mobility: Secondary | ICD-10-CM | POA: Insufficient documentation

## 2019-11-28 DIAGNOSIS — M25675 Stiffness of left foot, not elsewhere classified: Secondary | ICD-10-CM | POA: Diagnosis not present

## 2019-11-28 DIAGNOSIS — R262 Difficulty in walking, not elsewhere classified: Secondary | ICD-10-CM | POA: Insufficient documentation

## 2019-11-28 DIAGNOSIS — M79675 Pain in left toe(s): Secondary | ICD-10-CM | POA: Diagnosis not present

## 2019-11-28 NOTE — Therapy (Signed)
[facility] [address]  [place], [place], [address] Phone: [phone]   Fax:  [phone]  Physical Therapy Treatment  Patient Details  Name: Elaine Buck MRN: [id number] Date of Birth: January 09, 1956 Referring Provider (PT): Celesta Gentile, DPM   Encounter Date: 11/28/2019   PT End of Session - 11/28/19 1715    Visit Number 13    Number of Visits 17    Date for PT Re-Evaluation 12/04/19    Authorization Type UHC Medicare    PT Start Time 1532    PT Stop Time 1613    PT Time Calculation (min) 41 min    Activity Tolerance Patient tolerated treatment well;Patient limited by pain    Behavior During Therapy [facility] for tasks assessed/performed           Past Medical History:  Diagnosis Date  . Anxiety   . Cancer ([place])    breast (Left)  . History of lobular carcinoma in situ (LCIS) of  left breast    s/p lumpectomy in 2005, Tamoxifen/Evista therapy for 5 years  . Hyperlipidemia   . Thyroid disease     Past Surgical History:  Procedure Laterality Date  . BREAST BIOPSY    . BREAST LUMPECTOMY Left 05/22/2003   LCIS Cells found in breast tissue  . FOOT SURGERY  04/2019    There were no vitals filed for this visit.   Subjective Assessment - 11/28/19 1533    Subjective Received another injection to the L great toe sesamoid bone. Felt fine after DN last session, but does not feel that it gave her benefit. However, has not woken up with cramps for the past 3 weeks.    Pertinent History thyroid disease, HLD, hx breast CA with lumpectomy 2005, anxiety    Diagnostic tests 08/04/19 L foot xray: bony resorptive changes at the second PIP joint whichmay be postsurgical/posttraumatic, or due to infection. Stable appearance of the first MTPJ arthroplasty    Patient Stated Goals "walk without pain and wear shoes without pain"    Currently in Pain? No/denies                             [facility] Adult PT  Treatment/Exercise - 11/28/19 0001      Knee/Hip Exercises: Aerobic   Tread Mill L2.8 x 5 min, L3.0 x1 min holding onto handles      Manual Therapy   Manual Therapy Joint mobilization;Passive ROM    Manual therapy comments prone    Joint Mobilization L great toe distraction, MTP flexion and extension mobs to tolerance grade III/IV    Passive ROM L plantarflexion + great toe and toes flexion stretch to tolerance, R great toe and other toes extension stretch to tolerance      Ankle Exercises: Seated   Heel Raises Left;15 reps   10# over knee, ROM to tolerance   Other Seated Ankle Exercises L great toe+ toes extension stretch 5x10" to tolerance, scooting forward in seat to increase stretch'      Ankle Exercises: Standing   Toe Walk (Round Trip) with bent and extended knees, along counter top x4 min   shorter step length on R LE                   PT Short Term Goals - 10/26/19 1022      PT SHORT TERM GOAL #1   Title Patient to be  independent with initial HEP.    Time 3    Period Weeks    Status Achieved    Target Date 10/25/19             PT Long Term Goals - 11/09/19 1046      PT LONG TERM GOAL #1   Title Patient to be independent with advanced HEP.    Time 6    Period Weeks    Status Partially Met   met for current HEP     PT LONG TERM GOAL #2   Title Patient to demonstrate 15 degree improvement in L great toe flexion/extension PROM.    Baseline L 1st digit flexion 7 deg, extension 25 deg    Time 6    Period Weeks    Status Achieved   11/06/19: great toe extension PROM 47 dg, flexion PROM 35 dg     PT LONG TERM GOAL #3   Title Patient to demonstrate B LE strength >/=4+/5.    Time 6    Period Weeks    Status Partially Met   11/06/19: met for all with exception of L PF strength of 4/5     PT LONG TERM GOAL #4   Title Patient to report tolerance for 30 minutes of standing/walking without pain limiting.    Time 6    Period Weeks    Status Partially Met    11/06/19: 30 min standing met not met for walking; only 10 min tolerance for walking     PT LONG TERM GOAL #5   Title Patient to report 70% improvement in frequency of L LE cramping.    Time 6    Period Weeks    Status Partially Met   11/06/19:  notes 50-60% improvement in L LE cramping                Plan - 11/28/19 1716    Clinical Impression Statement Patient reporting that she received another injection to the L great toe sesamoid and denies issues after recent DN session, however feels that DN did not benefit her. Patient reports that she has not woken up with cramps for the past 3 weeks, but still struggles with tightness in all 5 toes, making it difficult to wear certain shoes. Patient tolerated joint mobilizations and passive stretching to L ankle and toes without complaints. Patient still unable to tolerate sitting toe flexion/plantarflexion stretch despite good tolerance for passive stretching in this position. Patient required cueing to maintain pressure over the 2 most medial toes with stretching and heel raises, as patient is lacking most mobility in the 1st and 2nd digits. Worked on toe walking with patient demonstrating slightly decreased step length on R LE with good effort to correct. Patient without complaints at end of session.    Comorbidities thyroid disease, HLD, hx breast CA with lumpectomy 2005, anxiety    Rehab Potential Good    PT Frequency 2x / week    PT Treatment/Interventions ADLs/Self Care Home Management;Cryotherapy;Electrical Stimulation;Moist Heat;Balance training;Therapeutic exercise;Therapeutic activities;Functional mobility training;Stair training;Gait training;Ultrasound;Neuromuscular re-education;Patient/family education;Manual techniques;Vasopneumatic Device;Taping;Energy conservation;Dry needling;Passive range of motion;Scar mobilization    PT Next Visit Plan DN to L medial gastroc, lateral/medial HS, and medial plantar surface of foot? L HS stretching.  STM to L calf, progress L ankle and great toe mobility    Consulted and Agree with Plan of Care Patient           Patient will benefit from skilled therapeutic intervention in order  to improve the following deficits and impairments:  Abnormal gait, Hypomobility, Increased edema, Decreased scar mobility, Decreased activity tolerance, Decreased strength, Pain, Increased fascial restricitons, Decreased balance, Difficulty walking, Increased muscle spasms, Improper body mechanics, Decreased range of motion, Impaired flexibility, Postural dysfunction  Visit Diagnosis: Pain in left toe(s)  Stiffness of left foot, not elsewhere classified  Other muscle spasm  Difficulty in walking, not elsewhere classified  Other abnormalities of gait and mobility     Problem List Patient Active Problem List   Diagnosis Date Noted  . Arthritis of left acromioclavicular joint 09/19/2019  . Metatarsal deformity, left 05/19/2019  . Hallux limitus of left foot 04/11/2019  . Hammer toe of left foot 04/11/2019  . Low back pain 04/10/2019  . Numbness of hand 04/10/2019  . Shoulder pain 04/10/2019  . History of lobular carcinoma in situ (LCIS) of  left breast   . Anxiety 07/02/2017  . Hypothyroidism 07/02/2017  . Hyperlipidemia 07/02/2017  . Vitamin D deficiency 04/09/2016  . Osteopenia 03/05/2014  . Carpal tunnel syndrome 11/01/2013  . Depressive disorder 01/21/2010  . Polyp of colon 03/26/2009  . Malignant tumor of breast ([place]) 01/19/2008     Janene Harvey, PT, DPT 11/28/19 5:18 PM   [facility] [address]  [place], [place], [address] Phone: [phone]   Fax:  [phone]  Name: Elaine Buck MRN: [id number] Date of Birth: 27-Jan-1956

## 2019-12-01 NOTE — Progress Notes (Signed)
Subjective: 64 year old female presents the office today for follow evaluation after undergoing nerve biopsy.  She states that the procedure sites are doing well although she does have some discomfort on the right procedure site but is getting better.  Denies any drainage or pus present or redness or red streaks.  She is requesting steroid injection to the sesamoids where she points on her left foot. Denies any systemic complaints such as fevers, chills, nausea, vomiting. No acute changes since last appointment, and no other complaints at this time.   Objective: AAO x3, NAD DP/PT pulses palpable bilaterally, CRT less than 3 seconds Procedure sites appear to be doing well granulation tissue is present.  Left side strength scab over but still somewhat open on the right side but there is no surrounding erythema, ascending cellulitis but there is no drainage or pus or any signs of infection. There is tenderness the sesamoid complex particular on the medial sesamoid.  There is no edema, erythema there is prominence of the sesamoids.  MMT 5/5. No pain with calf compression, swelling, warmth, erythema  Assessment: Sesamoiditis; Healing procedure sites from nerve biopsy  Plan: -All treatment options discussed with the patient including all alternatives, risks, complications.  -Sterile injection was formed to the sesamoid complex.  The skin was cleaned with alcohol and a mixture of half cc dexamethasone phosphate and half cc Marcaine plain was infiltrated on the area maximal tenderness without complications.  Postinjection care discussed. -Continue antibiotic ointment dressing changes to the wounds with the procedure site daily.  Wash with soap and water.  Monitoring signs or symptoms of infection.  Awaiting results. -Patient encouraged to call the office with any questions, concerns, change in symptoms.   [name]

## 2019-12-04 ENCOUNTER — Encounter: Payer: Self-pay | Admitting: Podiatry

## 2019-12-04 ENCOUNTER — Other Ambulatory Visit: Payer: Self-pay | Admitting: Podiatry

## 2019-12-04 ENCOUNTER — Encounter: Payer: Self-pay | Admitting: Physical Therapy

## 2019-12-04 ENCOUNTER — Ambulatory Visit: Payer: Medicare Other | Admitting: Physical Therapy

## 2019-12-04 ENCOUNTER — Other Ambulatory Visit: Payer: Self-pay

## 2019-12-04 DIAGNOSIS — M62838 Other muscle spasm: Secondary | ICD-10-CM | POA: Diagnosis not present

## 2019-12-04 DIAGNOSIS — M79675 Pain in left toe(s): Secondary | ICD-10-CM | POA: Diagnosis not present

## 2019-12-04 DIAGNOSIS — G629 Polyneuropathy, unspecified: Secondary | ICD-10-CM

## 2019-12-04 DIAGNOSIS — R262 Difficulty in walking, not elsewhere classified: Secondary | ICD-10-CM | POA: Diagnosis not present

## 2019-12-04 DIAGNOSIS — R2689 Other abnormalities of gait and mobility: Secondary | ICD-10-CM

## 2019-12-04 DIAGNOSIS — M25675 Stiffness of left foot, not elsewhere classified: Secondary | ICD-10-CM

## 2019-12-04 NOTE — Progress Notes (Signed)
Thank you :)

## 2019-12-04 NOTE — Therapy (Addendum)
[facility] [address]  [place], [place], [address] Phone: [phone]   Fax:  [phone]  Physical Therapy Treatment  Patient Details  Name: Elaine Buck MRN: [id number] Date of Birth: Jan 21, 1956 Referring Provider (PT): Celesta Gentile, DPM   Progress Note Reporting Period 11/09/19 to 12/04/19  See note below for Objective Data and Assessment of Progress/Goals.     Encounter Date: 12/04/2019   PT End of Session - 12/04/19 1203    Visit Number 14    Number of Visits 17    Date for PT Re-Evaluation 12/04/19    Authorization Type [employer] Medicare    PT Start Time 1100    PT Stop Time 1141    PT Time Calculation (min) 41 min    Activity Tolerance Patient tolerated treatment well    Behavior During Therapy WFL for tasks assessed/performed           Past Medical History:  Diagnosis Date  . Anxiety   . Cancer ([place])    breast (Left)  . History of lobular carcinoma in situ (LCIS) of  left breast    s/p lumpectomy in 2005, Tamoxifen/Evista therapy for 5 years  . Hyperlipidemia   . Thyroid disease     Past Surgical History:  Procedure Laterality Date  . BREAST BIOPSY    . BREAST LUMPECTOMY Left 05/22/2003   LCIS Cells found in breast tissue  . FOOT SURGERY  04/2019    There were no vitals filed for this visit.   Subjective Assessment - 12/04/19 1101    Subjective Not much new since last appointment. Still has not had any muscle spasms in weeks. Reports 80% improvement in L foot since initial eval.    Pertinent History thyroid disease, HLD, hx breast CA with lumpectomy 2005, anxiety    Diagnostic tests 08/04/19 L foot xray: bony resorptive changes at the second PIP joint whichmay be postsurgical/posttraumatic, or due to infection. Stable appearance of the first MTPJ arthroplasty    Patient Stated Goals "walk without pain and wear shoes without pain"    Currently in Pain? No/denies               [facility] PT Assessment - 12/04/19 0001      Assessment   Medical Diagnosis Hallux limitus of L foot, Hammer toe of L foot, L chronic foot pain, Capsulitis, L metatarsal deformity, s/p L foot surgery    Referring Provider (PT) Celesta Gentile, DPM    Onset Date/Surgical Date 05/03/19      Observation/Other Assessments   Focus on Therapeutic Outcomes (FOTO)  77 (23% limited)      PROM   Overall PROM Comments L 1st digit flexion 30 deg, extension 50 deg      Strength   Right Hip Flexion 5/5    Right Hip ABduction 4+/5    Right Hip ADduction 4+/5    Left Hip Flexion 5/5    Left Hip ABduction 4+/5    Left Hip ADduction 4+/5    Right Knee Flexion 5/5    Right Knee Extension 5/5    Left Knee Flexion 5/5    Left Knee Extension 5/5    Right Ankle Dorsiflexion 5/5    Right Ankle Plantar Flexion 5/5    Right Ankle Inversion 5/5    Right Ankle Eversion 5/5    Left Ankle Dorsiflexion 5/5    Left Ankle Plantar Flexion 4+/5   14 reps    Left Ankle  Inversion 5/5    Left Ankle Eversion 5/5                         [facility] Adult PT Treatment/Exercise - 12/04/19 0001      Knee/Hip Exercises: Aerobic   Recumbent Bike L2 x 80mn       Ankle Exercises: Stretches   Gastroc Stretch 2 reps;30 seconds    Gastroc Stretch Limitations L LE; toes on wall                  PT Education - 12/04/19 1200    Education Details discussion on objective progress with PT and update/consolidation of HEP    Person(s) Educated Patient    Methods Explanation;Demonstration;Tactile cues;Verbal cues;Handout    Comprehension Verbalized understanding;Returned demonstration            PT Short Term Goals - 12/04/19 1107      PT SHORT TERM GOAL #1   Title Patient to be independent with initial HEP.    Time 3    Period Weeks    Status Achieved    Target Date 10/25/19             PT Long Term Goals - 12/04/19 1107      PT LONG TERM GOAL #1   Title Patient to be independent  with advanced HEP.    Time 6    Period Weeks    Status Achieved      PT LONG TERM GOAL #2   Title Patient to demonstrate 15 degree improvement in L great toe flexion/extension PROM.    Baseline L 1st digit flexion 7 deg, extension 25 deg    Time 6    Period Weeks    Status Achieved   11/06/19: great toe extension PROM 47 dg, flexion PROM 35 dg     PT LONG TERM GOAL #3   Title Patient to demonstrate B LE strength >/=4+/5.    Time 6    Period Weeks    Status Achieved      PT LONG TERM GOAL #4   Title Patient to report tolerance for 30 minutes of standing/walking without pain limiting.    Time 6    Period Weeks    Status Achieved   reporting able to do 30 min or more of standing/walking     PT LONG TERM GOAL #5   Title Patient to report 70% improvement in frequency of L LE cramping.    Time 6    Period Weeks    Status Achieved   reports 100%improvement                Plan - 12/04/19 1204    Clinical Impression Statement Patient reporting 80% improvement in L foot since initial eval. Notes that she still has not had any muscle spasms in weeks. Patient's walking tolerance and night cramping goals have been met at this time, as patient is able to sleep undisturbed by muscle spasms and able to walk at least 30 minutes without pain. L plantarflexion strength has improved, now reaching 4+/5 and allowing patient to meet her strength goal today. L great toe MTP flexion and extension have improved as well, but with main deficit remaining in great toe flexion. Updated and consolidated HEP to address patient's remaining impairments and discussed most and least helpful gym equipment to use going forward. Patient reported understanding and without complaints at end of session. Patient has demonstrated good progress  towards goals and now on 30 day hold.    Comorbidities thyroid disease, HLD, hx breast CA with lumpectomy 2005, anxiety    Rehab Potential Good    PT Frequency 2x / week    PT  Treatment/Interventions ADLs/Self Care Home Management;Cryotherapy;Electrical Stimulation;Moist Heat;Balance training;Therapeutic exercise;Therapeutic activities;Functional mobility training;Stair training;Gait training;Ultrasound;Neuromuscular re-education;Patient/family education;Manual techniques;Vasopneumatic Device;Taping;Energy conservation;Dry needling;Passive range of motion;Scar mobilization    PT Next Visit Plan 30 day hold at this time    Consulted and Agree with Plan of Care Patient           Patient will benefit from skilled therapeutic intervention in order to improve the following deficits and impairments:  Abnormal gait, Hypomobility, Increased edema, Decreased scar mobility, Decreased activity tolerance, Decreased strength, Pain, Increased fascial restricitons, Decreased balance, Difficulty walking, Increased muscle spasms, Improper body mechanics, Decreased range of motion, Impaired flexibility, Postural dysfunction  Visit Diagnosis: Pain in left toe(s)  Stiffness of left foot, not elsewhere classified  Other muscle spasm  Difficulty in walking, not elsewhere classified  Other abnormalities of gait and mobility     Problem List Patient Active Problem List   Diagnosis Date Noted  . Arthritis of left acromioclavicular joint 09/19/2019  . Metatarsal deformity, left 05/19/2019  . Hallux limitus of left foot 04/11/2019  . Hammer toe of left foot 04/11/2019  . Low back pain 04/10/2019  . Numbness of hand 04/10/2019  . Shoulder pain 04/10/2019  . History of lobular carcinoma in situ (LCIS) of  left breast   . Anxiety 07/02/2017  . Hypothyroidism 07/02/2017  . Hyperlipidemia 07/02/2017  . Vitamin D deficiency 04/09/2016  . Osteopenia 03/05/2014  . Carpal tunnel syndrome 11/01/2013  . Depressive disorder 01/21/2010  . Polyp of colon 03/26/2009  . Malignant tumor of breast ([place]) 01/19/2008    Janene Harvey, PT, DPT 12/04/19 12:13 PM   [facility] [address]  [place], [place], [address] Phone: [phone]   Fax:  [phone]  Name: Elaine Buck MRN: [id number] Date of Birth: June 10, 1955  PHYSICAL THERAPY DISCHARGE SUMMARY  Visits from Start of Care: 14  Current functional level related to goals / functional outcomes: See above clinical impression; patient did not return during 30 day hold   Remaining deficits: none   Education / Equipment: HEP  Plan: Patient agrees to discharge.  Patient goals were met. Patient is being discharged due to meeting the stated rehab goals.  ?????     Janene Harvey, PT, DPT 01/09/20 2:55 PM

## 2019-12-05 ENCOUNTER — Encounter: Payer: Self-pay | Admitting: Neurology

## 2019-12-08 ENCOUNTER — Other Ambulatory Visit: Payer: Self-pay | Admitting: Podiatry

## 2019-12-08 DIAGNOSIS — E039 Hypothyroidism, unspecified: Secondary | ICD-10-CM | POA: Diagnosis not present

## 2019-12-08 DIAGNOSIS — E559 Vitamin D deficiency, unspecified: Secondary | ICD-10-CM | POA: Diagnosis not present

## 2019-12-08 DIAGNOSIS — H04123 Dry eye syndrome of bilateral lacrimal glands: Secondary | ICD-10-CM | POA: Diagnosis not present

## 2019-12-08 DIAGNOSIS — G629 Polyneuropathy, unspecified: Secondary | ICD-10-CM | POA: Diagnosis not present

## 2019-12-08 DIAGNOSIS — E538 Deficiency of other specified B group vitamins: Secondary | ICD-10-CM | POA: Diagnosis not present

## 2019-12-11 LAB — C-REACTIVE PROTEIN: CRP: 1.5 mg/L (ref <8.0)

## 2019-12-11 LAB — COMPLETE METABOLIC PANEL WITH GFR
AG Ratio: 1.9 (calc) (ref 1.0–2.5)
ALT: 23 U/L (ref 6–29)
AST: 22 U/L (ref 10–35)
Albumin: 4.5 g/dL (ref 3.6–5.1)
Alkaline phosphatase (APISO): 65 U/L (ref 37–153)
BUN: 16 mg/dL (ref 7–25)
CO2: 26 mmol/L (ref 20–32)
Calcium: 9.4 mg/dL (ref 8.6–10.4)
Chloride: 105 mmol/L (ref 98–110)
Creat: 0.76 mg/dL (ref 0.50–0.99)
GFR, Est African American: 95 mL/min/{1.73_m2} (ref >=60)
GFR, Est Non African American: 82 mL/min/{1.73_m2} (ref >=60)
Globulin: 2.4 g/dL (calc) (ref 1.9–3.7)
Glucose, Bld: 107 mg/dL (ref 65–139)
Potassium: 4.4 mmol/L (ref 3.5–5.3)
Sodium: 140 mmol/L (ref 135–146)
Total Bilirubin: 0.4 mg/dL (ref 0.2–1.2)
Total Protein: 6.9 g/dL (ref 6.1–8.1)

## 2019-12-11 LAB — PROTEIN ELECTROPHORESIS, SERUM
Albumin ELP: 4.3 g/dL (ref 3.8–4.8)
Alpha 1: 0.3 g/dL (ref 0.2–0.3)
Alpha 2: 0.8 g/dL (ref 0.5–0.9)
Beta 2: 0.3 g/dL (ref 0.2–0.5)
Beta Globulin: 0.5 g/dL (ref 0.4–0.6)
Gamma Globulin: 0.9 g/dL (ref 0.8–1.7)
Total Protein: 7.1 g/dL (ref 6.1–8.1)

## 2019-12-11 LAB — SEDIMENTATION RATE: Sed Rate: 6 mm/h (ref 0–30)

## 2019-12-11 LAB — VITAMIN B12: Vitamin B-12: 640 pg/mL (ref 200–1100)

## 2019-12-11 LAB — TSH: TSH: 0.03 mIU/L — ABNORMAL LOW (ref 0.40–4.50)

## 2019-12-11 LAB — VITAMIN D 25 HYDROXY (VIT D DEFICIENCY, FRACTURES): Vit D, 25-Hydroxy: 37 ng/mL (ref 30–100)

## 2019-12-21 ENCOUNTER — Other Ambulatory Visit: Payer: Self-pay

## 2019-12-21 ENCOUNTER — Telehealth: Payer: Self-pay | Admitting: Gastroenterology

## 2019-12-21 ENCOUNTER — Encounter: Payer: Self-pay | Admitting: Gastroenterology

## 2019-12-21 ENCOUNTER — Ambulatory Visit (INDEPENDENT_AMBULATORY_CARE_PROVIDER_SITE_OTHER): Payer: Medicare Other | Admitting: Gastroenterology

## 2019-12-21 VITALS — BP 116/76 | HR 90 | Ht 66.0 in | Wt 193.5 lb

## 2019-12-21 DIAGNOSIS — Z86010 Personal history of colonic polyps: Secondary | ICD-10-CM | POA: Diagnosis not present

## 2019-12-21 DIAGNOSIS — K5730 Diverticulosis of large intestine without perforation or abscess without bleeding: Secondary | ICD-10-CM

## 2019-12-21 DIAGNOSIS — K648 Other hemorrhoids: Secondary | ICD-10-CM | POA: Diagnosis not present

## 2019-12-21 MED ORDER — SUTAB 1479-225-188 MG PO TABS: 1.0000 | ORAL_TABLET | ORAL | 0 refills | Status: DC

## 2019-12-21 MED ORDER — CLENPIQ 10-3.5-12 MG-GM -GM/160ML PO SOLN: 1.0000 | ORAL | 0 refills | Status: DC

## 2019-12-21 NOTE — Progress Notes (Signed)
Chief Complaint: Colon cancer screening/Polyp surveillance   Referring Provider:     Shelda Pal, DO   HPI:    Elaine Buck is a 64 y.o. female referred to the Gastroenterology Clinic for evaluation of ongoing colon cancer screening/polyp surveillance.  Has had at 3 colonoscopies at outside facility in [place] as outlined below, with most recent in 03/2016 notable for rectal polyp.  No pathology report was sent from the colonoscopy for review.  Previous to that history of tubular adenomas and hyperplastic polyps.  Today, she states was previously told to have repeat in 3 years by prior GI.  She is otherwise without any GI symptoms and offers no complaints today.  Has received Covid vaccine along with booster.   Endoscopic History: -Colonoscopy (2010): Tubular adenomas and hyperplastic polyp of the sigmoid and rectum. -Colonoscopy (11/24/2011, Dr. Donnella Sham, MD, [place]): Sigmoid diverticulosis, 3 polyps in the proximal and distal sigmoid colon, all hyperplastic.  Moderate internal hemorrhoids -Colonoscopy (04/15/2016, Dr. Donnella Sham, MD, [place]): Sigmoid/descending diverticulosis, 6 mm rectal polyp.  No path with report.  No known family history of CRC, GI malignancy, liver disease, pancreatic disease, or IBD.    CMP Latest Ref Rng & Units 12/08/2019 12/08/2019 09/13/2019  Glucose 65 - 139 mg/dL - 107 101(H)  BUN 7 - 25 mg/dL - 16 16  Creatinine 0.50 - 0.99 mg/dL - 0.76 0.79  Sodium 135 - 146 mmol/L - 140 138  Potassium 3.5 - 5.3 mmol/L - 4.4 4.1  Chloride 98 - 110 mmol/L - 105 103  CO2 20 - 32 mmol/L - 26 29  Calcium 8.6 - 10.4 mg/dL - 9.4 9.3  Total Protein 6.1 - 8.1 g/dL 7.1 6.9 6.9  Total Bilirubin 0.2 - 1.2 mg/dL - 0.4 0.6  Alkaline Phos 39 - 117 U/L - - 72  AST 10 - 35 U/L - 22 23  ALT 6 - 29 U/L - 23 26      Past Medical History:  Diagnosis Date  . Anxiety   . Cancer ([place])    breast (Left)  .  History of colon polyps   . History of lobular carcinoma in situ (LCIS) of  left breast    s/p lumpectomy in 2005, Tamoxifen/Evista therapy for 5 years  . Hyperlipidemia   . Thyroid disease      Past Surgical History:  Procedure Laterality Date  . BREAST BIOPSY    . BREAST LUMPECTOMY Left 05/22/2003   LCIS Cells found in breast tissue  . FOOT SURGERY Left 04/2019   joint inplantment in big toe    Family History  Problem Relation Age of Onset  . Stroke Mother   . Bipolar disorder Son   . ADD / ADHD Son   . Anxiety disorder Son   . Colon cancer Neg Hx   . Esophageal cancer Neg Hx    Social History   Tobacco Use  . Smoking status: Current Every Day Smoker    Packs/day: 0.80    Years: 20.00    Pack years: 16.00    Types: Cigarettes  . Smokeless tobacco: Never Used  . Tobacco comment: Declines quitting for now  Vaping Use  . Vaping Use: Never used  Substance Use Topics  . Alcohol use: Yes    Comment: Occasionlly  . Drug use: No   Current Outpatient Medications  Medication Sig Dispense Refill  . ALPRAZolam (XANAX) 0.5  MG tablet Take 1 tablet (0.5 mg total) by mouth daily as needed for anxiety. 30 tablet 5  . atorvastatin (LIPITOR) 10 MG tablet Take 1 tablet (10 mg total) by mouth daily. 90 tablet 3  . Calcium Carb-Cholecalciferol (CALCIUM 1000 + D PO) Take 1 tablet by mouth daily.    . Cholecalciferol (VITAMIN D3 PO) Take 1 tablet by mouth daily.    Marland Kitchen levothyroxine (SYNTHROID) 125 MCG tablet Take 1 tablet (125 mcg total) by mouth daily. 90 tablet 2  . Multiple Vitamin (MULTIVITAMIN) tablet Take 1 tablet by mouth daily.    . Omega-3 Fatty Acids (FISH OIL PO) Take 1 tablet by mouth daily.    Marland Kitchen venlafaxine XR (EFFEXOR-XR) 150 MG 24 hr capsule Take 1 capsule (150 mg total) by mouth daily with breakfast. 90 capsule 2   No current facility-administered medications for this visit.   Allergies  Allergen Reactions  . Other Hives and Itching    Peaches     Review of  Systems: All systems reviewed and negative except where noted in HPI.     Physical Exam:    Wt Readings from Last 3 Encounters:  12/21/19 193 lb 8 oz (87.8 kg)  09/19/19 194 lb (88 kg)  09/13/19 194 lb 2 oz (88.1 kg)    BP 116/76   Pulse 90   Ht 5\' 6"  (1.676 m)   Wt 193 lb 8 oz (87.8 kg)   BMI 31.23 kg/m  Constitutional:  Pleasant, in no acute distress. Psychiatric: Normal mood and affect. Behavior is normal. EENT: Pupils normal.  Conjunctivae are normal. No scleral icterus. Neck supple. No cervical LAD. Cardiovascular: Normal rate, regular rhythm. No edema Pulmonary/chest: Effort normal and breath sounds normal. No wheezing, rales or rhonchi. Abdominal: Soft, nondistended, nontender. Bowel sounds active throughout. There are no masses palpable. No hepatomegaly. Neurological: Alert and oriented to person place and time. Skin: Skin is warm and dry. No rashes noted.   ASSESSMENT AND PLAN;   1) History of colon polyps: -Schedule colonoscopy for ongoing polyp surveillance given previous recommendation by her prior Gastroenterologist for repeat in 3-year interval.  Recommendations for ongoing intervals will be based on this next colonoscopy results  2) History of diverticulosis 3) History of internal hemorrhoids -Diverticulosis internal hemorrhoids noted on previous colonoscopy.  Otherwise asymptomatic.  Can assess location, grade, severity of each of these at time of colonoscopy as above  The indications, risks, and benefits of colonoscopy were explained to the patient in detail. Risks include but are not limited to bleeding, perforation, adverse reaction to medications, and cardiopulmonary compromise. Sequelae include but are not limited to the possibility of surgery, hospitalization, and mortality. The patient verbalized understanding and wished to proceed. All questions answered, referred to the scheduler and bowel prep ordered. Further recommendations pending results of the  exam.    Lavena Bullion, DO, FACG  12/21/2019, 10:07 AM   Nani Ravens, [place]*

## 2019-12-21 NOTE — Patient Instructions (Signed)
If you are age 65 or older, your body mass index should be between 23-30. Your Body mass index is 31.23 kg/m. If this is out of the aforementioned range listed, please consider follow up with your Primary Care Provider.  If you are age 57 or younger, your body mass index should be between 19-25. Your Body mass index is 31.23 kg/m. If this is out of the aformentioned range listed, please consider follow up with your Primary Care Provider.   You have been scheduled for a colonoscopy. Please follow written instructions given to you at your visit today.  Please pick up your prep supplies at the pharmacy within the next 1-3 days. If you use inhalers (even only as needed), please bring them with you on the day of your procedure.  We have sent the following medications to your pharmacy for you to pick up at your convenience: Clenpiq  It was a pleasure to see you today!  Vito Cirigliano, D.O.

## 2019-12-21 NOTE — Telephone Encounter (Signed)
Pt is wanting to inform Peter Congo that she is wanting to pick up the Clenpiq sample. Pt would like to know when she could pick this up

## 2019-12-22 ENCOUNTER — Other Ambulatory Visit: Payer: Self-pay

## 2019-12-22 ENCOUNTER — Ambulatory Visit (INDEPENDENT_AMBULATORY_CARE_PROVIDER_SITE_OTHER): Payer: Medicare Other | Admitting: Podiatry

## 2019-12-22 DIAGNOSIS — M2580 Other specified joint disorders, unspecified joint: Secondary | ICD-10-CM | POA: Diagnosis not present

## 2019-12-22 DIAGNOSIS — G629 Polyneuropathy, unspecified: Secondary | ICD-10-CM | POA: Diagnosis not present

## 2019-12-26 NOTE — Progress Notes (Signed)
Subjective: 64 year old female presents the office today for follow evaluation of left foot pain. She had an injection previously which lasted only a couple of days. She also had nerve conduction test that did reveal advanced small fiber neuropathy in the left side and mild neuropathy on the right side. Prior to the surgery been tried on gabapentin and she was on this previously with her other foot doctor but was not helpful. Tried on Lyrica which was unhelpful as well. Blood work was ordered and referral was placed to neurology. Unfortunate she cannot see neurology until December. Only abnormal test was her TSH was 0.03.  Objective: AAO x3, NAD DP/PT pulses palpable bilaterally, CRT less than 3 seconds Procedure sites are healed. There is tenderness along the sesamoid complex on the left foot plantarly. She is also been having still burning, tingling to her foot on the left side. There is no area pinpoint tenderness. There is no edema, erythema there is prominence of the sesamoids.  MMT 5/5. No pain with calf compression, swelling, warmth, erythema  Assessment: Sesamoiditis; neuropathy  Plan: -All treatment options discussed with the patient including all alternatives, risks, complications.  -We will discuss with her primary care physician regards to her TSH levels which she is concerned about. Also encouraged her follow-up with neurology. Will discuss starting Cymbalta with her primary care physician. -Sterile injection was formed to the sesamoid complex with care taken not to directly infiltrate around the implant.  The skin was cleaned with alcohol and a mixture of half cc Kenalog 10 and half cc Marcaine plain was infiltrated on the area maximal tenderness without complications.  Postinjection care discussed. -Patient encouraged to call the office with any questions, concerns, change in symptoms.   [name]

## 2019-12-28 ENCOUNTER — Encounter: Payer: Self-pay | Admitting: Podiatry

## 2020-01-08 ENCOUNTER — Other Ambulatory Visit: Payer: Self-pay

## 2020-01-08 ENCOUNTER — Ambulatory Visit (INDEPENDENT_AMBULATORY_CARE_PROVIDER_SITE_OTHER): Payer: Medicare Other | Admitting: Family Medicine

## 2020-01-08 ENCOUNTER — Encounter: Payer: Self-pay | Admitting: Family Medicine

## 2020-01-08 VITALS — BP 118/76 | HR 95 | Temp 97.9°F | Ht 66.0 in | Wt 186.2 lb

## 2020-01-08 DIAGNOSIS — E039 Hypothyroidism, unspecified: Secondary | ICD-10-CM | POA: Diagnosis not present

## 2020-01-08 DIAGNOSIS — G629 Polyneuropathy, unspecified: Secondary | ICD-10-CM

## 2020-01-08 DIAGNOSIS — R7989 Other specified abnormal findings of blood chemistry: Secondary | ICD-10-CM

## 2020-01-08 MED ORDER — NORTRIPTYLINE HCL 10 MG PO CAPS: 10.0000 mg | ORAL_CAPSULE | Freq: Every day | ORAL | 3 refills | Status: DC

## 2020-01-08 NOTE — Progress Notes (Signed)
Chief Complaint  Patient presents with  . Follow-up    medication change    Subjective: Patient is a 64 y.o. female here for f/u.  Patient has been dealing with left foot issues for many months.  She is following with the podiatry team who performed a nerve biopsy that did show neuropathy.  He was interested in adding Cymbalta to her regimen.  She is on Effexor XR 150 mg daily which is helping with her anxiety.  She has failed topical capsaicin cream, Lyrica, and gabapentin.  She has an appointment with a neurologist in December.  She is wondering what other options she has.  She is also recommended to take it be complex vitamin and alpha lipoic acid.  Her B12 in the middle of September was 640.  Of note, her TSH was decreased.  She is here to follow-up on that as well.  No personal or family history of thyroid disease.  Past Medical History:  Diagnosis Date  . Anxiety   . Cancer ([place])    breast (Left)  . History of colon polyps   . History of lobular carcinoma in situ (LCIS) of  left breast    s/p lumpectomy in 2005, Tamoxifen/Evista therapy for 5 years  . Hyperlipidemia   . Thyroid disease     Objective: BP 118/76 (BP Location: Left Arm, Patient Position: Sitting, Cuff Size: Normal)   Pulse 95   Temp 97.9 F (36.6 C) (Oral)   Ht 5\' 6"  (1.676 m)   Wt 186 lb 4 oz (84.5 kg)   SpO2 99%   BMI 30.06 kg/m  General: Awake, appears stated age Lungs:  No accessory muscle use Psych: Age appropriate judgment and insight, normal affect and mood  Assessment and Plan: Neuropathy - Plan: nortriptyline (PAMELOR) 10 MG capsule  Low TSH level - Plan: T4, free, TSH  1.  After discussing the potential of her anxiety being less controlled by switching to Cymbalta, she wishes to continue the Effexor at current dosage.  We will add nortriptyline 10 mg daily.  Could consider dosage increase versus amitriptyline in the future.  We also could try changing the Effexor in the future as well.  I would  like to see her back in 1 month. 2.  Check free T4 and TSH today. The patient voiced understanding and agreement to the plan.  [place], DO 01/08/20  11:53 AM

## 2020-01-08 NOTE — Patient Instructions (Addendum)
Let me know if there are cost issues with the new medicine. It is on the $4 list at [facility].   We can always change Effexor to Cymbalta in the future, but it may affect your mood.  Give [place] 2-3 business days to get the results of your labs back.   Let us know if you need anything.

## 2020-01-09 LAB — T4, FREE: Free T4: 1.7 ng/dL (ref 0.8–1.8)

## 2020-01-09 LAB — TSH: TSH: 0.05 mIU/L — ABNORMAL LOW (ref 0.40–4.50)

## 2020-01-11 ENCOUNTER — Encounter: Payer: Self-pay | Admitting: Gastroenterology

## 2020-01-19 ENCOUNTER — Ambulatory Visit: Payer: Medicare Other | Admitting: Podiatry

## 2020-01-19 ENCOUNTER — Other Ambulatory Visit: Payer: Self-pay

## 2020-01-19 ENCOUNTER — Ambulatory Visit (INDEPENDENT_AMBULATORY_CARE_PROVIDER_SITE_OTHER): Payer: Medicare Other

## 2020-01-19 DIAGNOSIS — M25872 Other specified joint disorders, left ankle and foot: Secondary | ICD-10-CM

## 2020-01-19 DIAGNOSIS — M79672 Pain in left foot: Secondary | ICD-10-CM

## 2020-01-19 DIAGNOSIS — G629 Polyneuropathy, unspecified: Secondary | ICD-10-CM | POA: Diagnosis not present

## 2020-01-19 DIAGNOSIS — M2580 Other specified joint disorders, unspecified joint: Secondary | ICD-10-CM | POA: Diagnosis not present

## 2020-01-19 DIAGNOSIS — M7732 Calcaneal spur, left foot: Secondary | ICD-10-CM | POA: Diagnosis not present

## 2020-01-19 DIAGNOSIS — M19072 Primary osteoarthritis, left ankle and foot: Secondary | ICD-10-CM | POA: Diagnosis not present

## 2020-01-19 IMAGING — DX DG FOOT COMPLETE 3+V*L*
3 series · 3 of 3 positions shown · non-contrast
Comparison: [DATE]

CLINICAL DATA: Foot pain.

EXAM:
LEFT FOOT - COMPLETE 3+ VIEW

[foot ap]
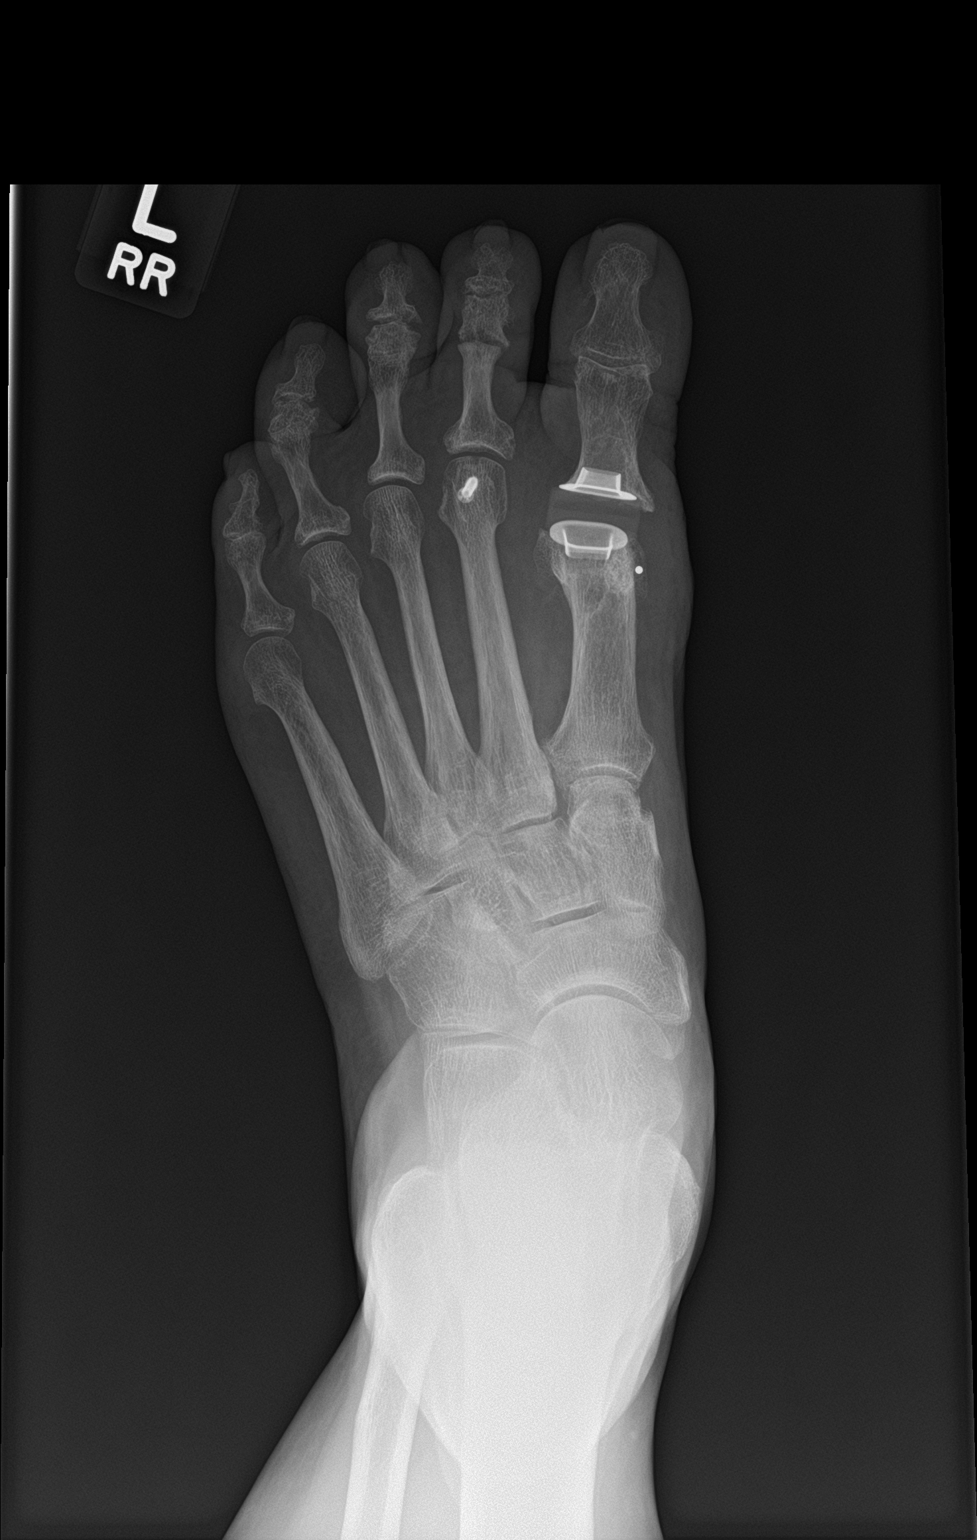

[foot obl]
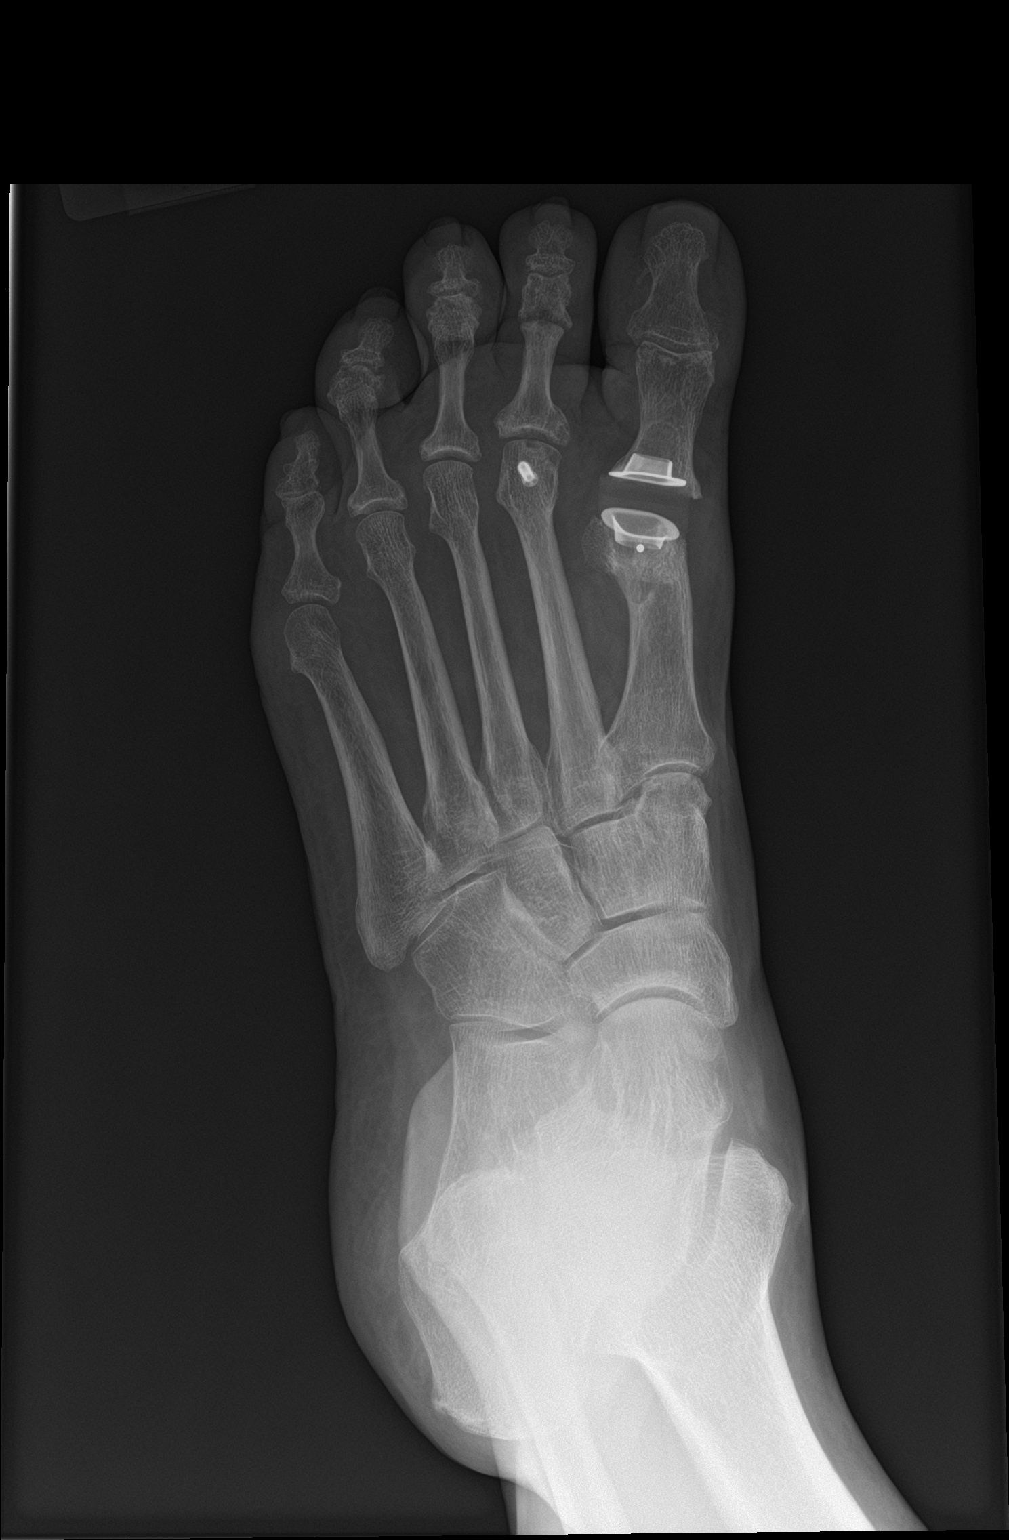

[foot lat]
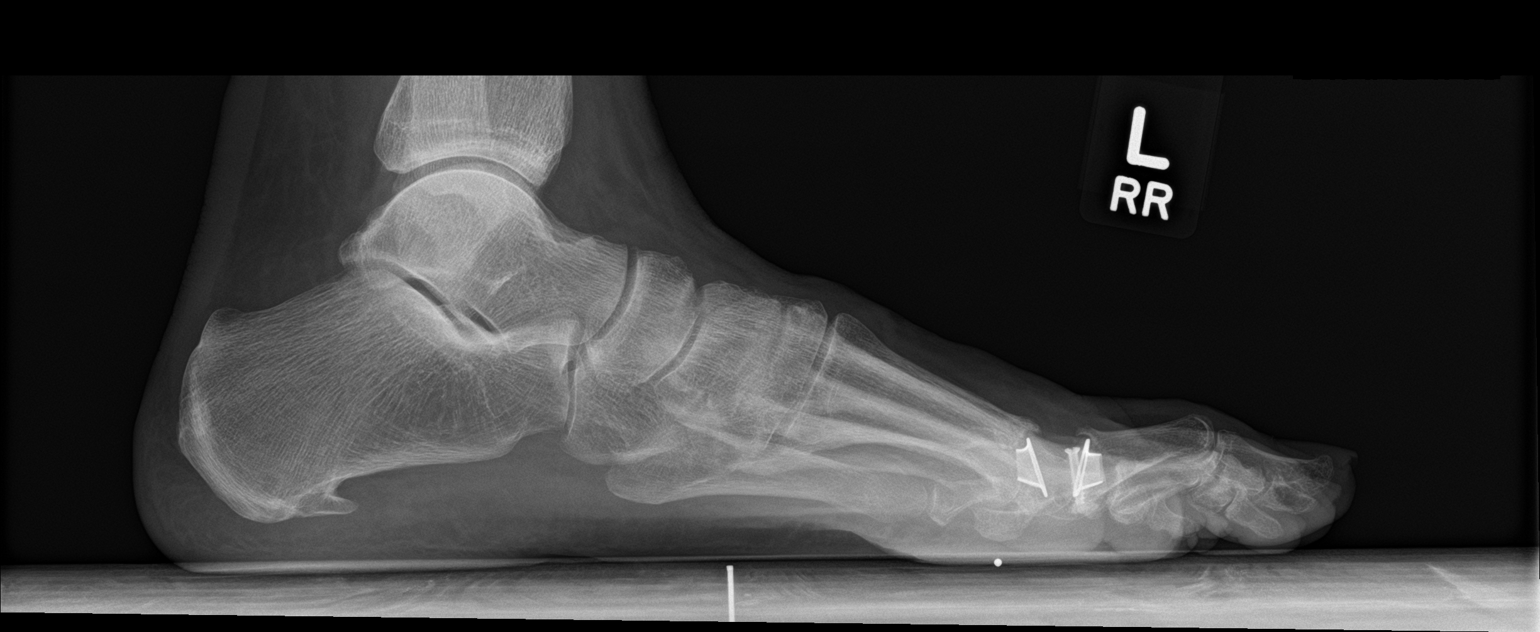

[3 of 3 positions shown; findings below may reference images not displayed]

FINDINGS: Stable surgical changes related to a first MTP joint arthroplasty.
No complicating features are identified. There is also a single
screw in the second metatarsal head which is stable.

Advanced degenerative changes involving the interphalangeal joints
with findings suspicious for an erosive arthropathy. Moderate sized
calcaneal heel spur.

No acute bony findings.
IMPRESSION: 1. Stable surgical changes involving the foot.
2. No acute bony findings.
3. Advanced interphalangeal joint degenerative changes.

## 2020-01-19 NOTE — Progress Notes (Signed)
Subjective: 64 year old female presents the office today for follow evaluation of left foot pain and neuropathy.  She is on nortriptyline and on this for about 1 week did not see much improvement in the nerve symptoms.  Regards to her foot pain she states that if she walks about 1 mile she starts to get pain she points on the plantar aspect the foot along the medial sesamoid.  Denies recent injury or falls.  She has no other concerns today.  Objective: AAO x3, NAD DP/PT pulses palpable bilaterally, CRT less than 3 seconds There is prominence of the sesamoid complex and there is tenderness along the medial sesamoid plantarly.  Decreasing to motion across the BJ and plantar flexion.  Mild hallux malleus is evident.  No significant other areas of discomfort. No pain with calf compression, swelling, warmth, erythema  Assessment: Sesamoiditis; neuropathy  Plan: -All treatment options discussed with the patient including all alternatives, risks, complications.  -X-rays obtained reviewed no marker was utilized identified area discomfort.  This is on the medial sesamoid. -We long discussion regards to her foot pain in both regards to surgical and conservative treatment.  Conservatively we can continue with the orthotic, see if we can make an accommodative insert as well.  Discussed with her surgical intervention including removal of medial sesamoid.  Discussed the possible revision of the implant versus arthrodesis of the first MPJ.  She does not want to have this done at least next year after the holidays.  Will think about the options. -Continue current medications for neuropathy.  [name]

## 2020-01-25 ENCOUNTER — Other Ambulatory Visit: Payer: Self-pay

## 2020-01-25 ENCOUNTER — Encounter: Payer: Self-pay | Admitting: Gastroenterology

## 2020-01-25 ENCOUNTER — Ambulatory Visit (AMBULATORY_SURGERY_CENTER): Payer: Medicare Other | Admitting: Gastroenterology

## 2020-01-25 VITALS — BP 135/66 | HR 65 | Temp 98.0°F | Resp 23 | Ht 66.0 in | Wt 193.0 lb

## 2020-01-25 DIAGNOSIS — K635 Polyp of colon: Secondary | ICD-10-CM | POA: Diagnosis not present

## 2020-01-25 DIAGNOSIS — D128 Benign neoplasm of rectum: Secondary | ICD-10-CM | POA: Diagnosis not present

## 2020-01-25 DIAGNOSIS — K641 Second degree hemorrhoids: Secondary | ICD-10-CM

## 2020-01-25 DIAGNOSIS — Z86010 Personal history of colonic polyps: Secondary | ICD-10-CM

## 2020-01-25 DIAGNOSIS — D123 Benign neoplasm of transverse colon: Secondary | ICD-10-CM

## 2020-01-25 DIAGNOSIS — K6289 Other specified diseases of anus and rectum: Secondary | ICD-10-CM

## 2020-01-25 DIAGNOSIS — K5730 Diverticulosis of large intestine without perforation or abscess without bleeding: Secondary | ICD-10-CM

## 2020-01-25 DIAGNOSIS — D125 Benign neoplasm of sigmoid colon: Secondary | ICD-10-CM

## 2020-01-25 MED ORDER — SODIUM CHLORIDE 0.9 % IV SOLN: 500.0000 mL | Freq: Once | INTRAVENOUS | Status: DC

## 2020-01-25 NOTE — Op Note (Signed)
[facility] Patient Name: [facility] Procedure Date: 01/25/2020 8:54 AM MRN: [id number] Endoscopist: Gerrit Heck , MD Age: 63 Referring MD:  Date of Birth: 07-04-1955 Gender: Female Account #: [id number] Procedure:                Colonoscopy Indications:              High risk colon cancer surveillance: Personal                            history of colonic polyps                           Endoscopic History:                           ?"Colonoscopy (2010): Tubular adenomas and                            hyperplastic polyp of the sigmoid and rectum.                           ?"Colonoscopy (11/24/2011, Dr. Donnella Sham, MD, [place]): Sigmoid diverticulosis,                            3 polyps in the proximal and distal sigmoid colon,                            all hyperplastic. Moderate internal hemorrhoids                           ?"Colonoscopy (04/15/2016, Dr. Donnella Sham, MD,                            [place]):                            Sigmoid/descending diverticulosis, 6 mm rectal                            polyp. No path with report. Recommended repeat in                            3 years. Medicines:                Monitored Anesthesia Care Procedure:                Pre-Anesthesia Assessment:                           - Prior to the procedure, a History and Physical                            was performed, and patient medications and  allergies were reviewed. The patient's tolerance of                            previous anesthesia was also reviewed. The risks                            and benefits of the procedure and the sedation                            options and risks were discussed with the patient.                            All questions were answered, and informed consent                            was obtained. Prior Anticoagulants: The  patient has                            taken no previous anticoagulant or antiplatelet                            agents. ASA Grade Assessment: II - A patient with                            mild systemic disease. After reviewing the risks                            and benefits, the patient was deemed in                            satisfactory condition to undergo the procedure.                           After obtaining informed consent, the colonoscope                            was passed under direct vision. Throughout the                            procedure, the patient's blood pressure, pulse, and                            oxygen saturations were monitored continuously. The                            Colonoscope was introduced through the anus and                            advanced to the the cecum, identified by                            appendiceal orifice and ileocecal valve. The  colonoscopy was performed without difficulty. The                            patient tolerated the procedure well. The quality                            of the bowel preparation was good. The ileocecal                            valve, appendiceal orifice, and rectum were                            photographed. Scope In: 9:02:44 AM Scope Out: 9:28:27 AM Scope Withdrawal Time: 0 hours 22 minutes 1 second  Total Procedure Duration: 0 hours 25 minutes 43 seconds  Findings:                 Hemorrhoids were found on perianal exam.                           Four sessile polyps were found in the sigmoid colon                            (3) and transverse colon. The polyps were 3 to 6 mm                            in size. These polyps were removed with a cold                            snare. Resection and retrieval were complete.                            Estimated blood loss was minimal.                           A few sessile polyps were found in the rectum. The                             polyps were 1 to 3 mm in size. Several of these                            polyps were removed with a cold snare for                            histologic representative evaluation. Resection and                            retrieval were complete. Estimated blood loss was                            minimal.                           Multiple small and large-mouthed diverticula were  found in the sigmoid colon.                           Anal papilla(e) were hypertrophied. Complications:            No immediate complications. Estimated Blood Loss:     Estimated blood loss was minimal. Impression:               - Hemorrhoids found on perianal exam.                           - Four 3 to 6 mm polyps in the sigmoid colon and in                            the transverse colon, removed with a cold snare.                            Resected and retrieved.                           - A few 1 to 3 mm polyps in the rectum, removed                            with a cold snare. Resected and retrieved.                           - Diverticulosis in the sigmoid colon.                           - Anal papilla(e) were hypertrophied. Recommendation:           - Patient has a contact number available for                            emergencies. The signs and symptoms of potential                            delayed complications were discussed with the                            patient. Return to normal activities tomorrow.                            Written discharge instructions were provided to the                            patient.                           - Resume previous diet.                           - Continue present medications.                           - Await pathology results.                           -  Repeat colonoscopy in 3 - 5 years for                            surveillance based on pathology results.                           - Return to GI  office PRN. Gerrit Heck, MD 01/25/2020 9:33:45 AM

## 2020-01-25 NOTE — Progress Notes (Signed)
Called to room to assist during endoscopic procedure.  Patient ID and intended procedure confirmed with present staff. Received instructions for my participation in the procedure from the performing physician.  

## 2020-01-25 NOTE — Progress Notes (Signed)
Pt Drowsy. VSS. To PACU, report to RN. No anesthetic complications noted.  

## 2020-01-25 NOTE — Patient Instructions (Signed)
YOU HAD AN ENDOSCOPIC PROCEDURE TODAY AT THE [facility]:   Refer to the procedure report that was given to you for any specific questions about what was found during the examination.  If the procedure report does not answer your questions, please call your gastroenterologist to clarify.  If you requested that your care partner not be given the details of your procedure findings, then the procedure report has been included in a sealed envelope for you to review at your convenience later.  YOU SHOULD EXPECT: Some feelings of bloating in the abdomen. Passage of more gas than usual.  Walking can help get rid of the air that was put into your GI tract during the procedure and reduce the bloating. If you had a lower endoscopy (such as a colonoscopy or flexible sigmoidoscopy) you may notice spotting of blood in your stool or on the toilet paper. If you underwent a bowel prep for your procedure, you may not have a normal bowel movement for a few days.  Please Note:  You might notice some irritation and congestion in your nose or some drainage.  This is from the oxygen used during your procedure.  There is no need for concern and it should clear up in a day or so.  SYMPTOMS TO REPORT IMMEDIATELY:   Following lower endoscopy (colonoscopy or flexible sigmoidoscopy):  Excessive amounts of blood in the stool  Significant tenderness or worsening of abdominal pains  Swelling of the abdomen that is new, acute  Fever of 100F or higher  For urgent or emergent issues, a gastroenterologist can be reached at any hour by calling [phone]. Do not use MyChart messaging for urgent concerns.    DIET:  We do recommend a small meal at first, but then you may proceed to your regular diet.  Drink plenty of fluids but you should avoid alcoholic beverages for 24 hours.  ACTIVITY:  You should plan to take it easy for the rest of today and you should NOT DRIVE or use heavy machinery until tomorrow (because  of the sedation medicines used during the test).    FOLLOW UP: Our staff will call the number listed on your records 48-72 hours following your procedure to check on you and address any questions or concerns that you may have regarding the information given to you following your procedure. If we do not reach you, we will leave a message.  We will attempt to reach you two times.  During this call, we will ask if you have developed any symptoms of COVID 19. If you develop any symptoms (ie: fever, flu-like symptoms, shortness of breath, cough etc.) before then, please call [phone].  If you test positive for Covid 19 in the 2 weeks post procedure, please call and report this information to us.    If any biopsies were taken you will be contacted by phone or by letter within the next 1-3 weeks.  Please call us at [phone] if you have not heard about the biopsies in 3 weeks.    SIGNATURES/CONFIDENTIALITY: You and/or your care partner have signed paperwork which will be entered into your electronic medical record.  These signatures attest to the fact that that the information above on your After Visit Summary has been reviewed and is understood.  Full responsibility of the confidentiality of this discharge information lies with you and/or your care-partner. 

## 2020-01-28 ENCOUNTER — Encounter: Payer: Self-pay | Admitting: Podiatry

## 2020-01-29 ENCOUNTER — Telehealth: Payer: Self-pay | Admitting: *Deleted

## 2020-01-29 NOTE — Telephone Encounter (Signed)
  Follow up Call-  Call back number 01/25/2020  Post procedure Call Back phone  # [phone]  Permission to leave phone message Yes  Some recent data might be hidden     Patient questions:  Do you have a fever, pain , or abdominal swelling? No. Pain Score  0 *  Have you tolerated food without any problems? Yes.    Have you been able to return to your normal activities? Yes.    Do you have any questions about your discharge instructions: Diet   No. Medications  No. Follow up visit  No.  Do you have questions or concerns about your Care? No.  Actions: * If pain score is 4 or above: No action needed, pain <4.  1. Have you developed a fever since your procedure? no  2.   Have you had an respiratory symptoms (SOB or cough) since your procedure? no  3.   Have you tested positive for COVID 19 since your procedure no  4.   Have you had any family members/close contacts diagnosed with the COVID 19 since your procedure?  no   If yes to any of these questions please route to Joylene John, RN and Joella Prince, RN

## 2020-01-30 ENCOUNTER — Encounter: Payer: Self-pay | Admitting: Gastroenterology

## 2020-02-01 ENCOUNTER — Other Ambulatory Visit: Payer: Self-pay

## 2020-02-01 ENCOUNTER — Ambulatory Visit: Payer: Medicare Other | Admitting: Orthotics

## 2020-02-01 DIAGNOSIS — M25872 Other specified joint disorders, left ankle and foot: Secondary | ICD-10-CM

## 2020-02-01 NOTE — Progress Notes (Signed)
Offloaded sesmoids left by increasing depth of k-wedge; adding poron.  She is going to bring me a dress shoe to modify to accomodoate semoids while she at wedding.

## 2020-02-05 ENCOUNTER — Ambulatory Visit (INDEPENDENT_AMBULATORY_CARE_PROVIDER_SITE_OTHER): Payer: Medicare Other | Admitting: Family Medicine

## 2020-02-05 ENCOUNTER — Other Ambulatory Visit: Payer: Self-pay

## 2020-02-05 ENCOUNTER — Encounter: Payer: Self-pay | Admitting: Podiatry

## 2020-02-05 ENCOUNTER — Encounter: Payer: Self-pay | Admitting: Family Medicine

## 2020-02-05 VITALS — BP 110/66 | HR 75 | Temp 98.4°F | Resp 12 | Ht 66.0 in | Wt 192.4 lb

## 2020-02-05 DIAGNOSIS — G629 Polyneuropathy, unspecified: Secondary | ICD-10-CM | POA: Insufficient documentation

## 2020-02-05 DIAGNOSIS — F419 Anxiety disorder, unspecified: Secondary | ICD-10-CM | POA: Diagnosis not present

## 2020-02-05 MED ORDER — DULOXETINE HCL 60 MG PO CPEP: 60.0000 mg | ORAL_CAPSULE | Freq: Every day | ORAL | 3 refills | Status: DC

## 2020-02-05 MED ORDER — ALPRAZOLAM 0.5 MG PO TABS: 0.5000 mg | ORAL_TABLET | Freq: Every day | ORAL | 5 refills | Status: DC | PRN

## 2020-02-05 MED ORDER — BUPROPION HCL ER (XL) 150 MG PO TB24: 150.0000 mg | ORAL_TABLET | Freq: Every day | ORAL | 3 refills | Status: DC

## 2020-02-05 NOTE — Progress Notes (Signed)
Chief Complaint  Patient presents with  . neuropathy med check    Subjective:  Patient is a 64 y.o. female here for follow-up neuropathy.  Patient was seen around 1 month ago and treated with nortriptyline 10 mg daily.  She is taking Effexor XR 150 mg daily at baseline for her mood.  She noticed no improvement.  She is interested in changing Effexor to Cymbalta and would want to go back on Wellbutrin which she did well in the past.  Past Medical History:  Diagnosis Date  . Anxiety   . Arthritis   . Cancer ([place])    breast (Left)  . History of colon polyps   . History of lobular carcinoma in situ (LCIS) of  left breast    s/p lumpectomy in 2005, Tamoxifen/Evista therapy for 5 years  . Hyperlipidemia   . Neuromuscular disorder ([place])    small fiber neuropathy, L foot   . Thyroid disease     Objective: BP 110/66 (BP Location: Right Arm, Cuff Size: Large)   Pulse 75   Temp 98.4 F (36.9 C) (Oral)   Resp 12   Ht 5\' 6"  (1.676 m)   Wt 192 lb 6.4 oz (87.3 kg)   SpO2 95%   BMI 31.05 kg/m  General: Awake, appears stated age Lungs: No accessory muscle use Psych: Age appropriate judgment and insight, normal affect and mood  Assessment and Plan: Neuropathy  Anxiety - Plan: ALPRAZolam (XANAX) 0.5 MG tablet  Status: Chronic, uncontrolled.  We will change Effexor to Cymbalta 60 mg daily and add back Wellbutrin XL 150 mg daily which she did well on in the past.  She has an appointment with the neurology team in around 5 weeks.  We will hold off on following up for this due to that upcoming specialty appointment.  I will see her in 6 months for a physical unless she needs [place]. The patient voiced understanding and agreement to the plan.  [place], DO 02/05/20  12:28 PM

## 2020-02-05 NOTE — Patient Instructions (Signed)
Start the Cymbalta tomorrow.   If things are going poorly, send a message and we can make a change prior to your Neurology appointment.   Let us know if you need anything.

## 2020-02-14 ENCOUNTER — Encounter: Payer: Self-pay | Admitting: Podiatry

## 2020-03-01 ENCOUNTER — Encounter: Payer: Self-pay | Admitting: Podiatry

## 2020-03-11 ENCOUNTER — Other Ambulatory Visit: Payer: Self-pay

## 2020-03-11 ENCOUNTER — Telehealth: Payer: Self-pay | Admitting: Podiatry

## 2020-03-11 ENCOUNTER — Encounter: Payer: Self-pay | Admitting: Neurology

## 2020-03-11 ENCOUNTER — Other Ambulatory Visit (INDEPENDENT_AMBULATORY_CARE_PROVIDER_SITE_OTHER): Payer: Medicare Other

## 2020-03-11 ENCOUNTER — Ambulatory Visit: Payer: Medicare Other | Admitting: Neurology

## 2020-03-11 VITALS — BP 115/73 | HR 77 | Ht 66.0 in | Wt 190.0 lb

## 2020-03-11 DIAGNOSIS — G629 Polyneuropathy, unspecified: Secondary | ICD-10-CM | POA: Diagnosis not present

## 2020-03-11 MED ORDER — DULOXETINE HCL 30 MG PO CPEP: 30.0000 mg | ORAL_CAPSULE | Freq: Every day | ORAL | 3 refills | Status: DC

## 2020-03-11 NOTE — Patient Instructions (Addendum)
Increase duloxetine to 90mg  daily  Check labs  Return to clinic 3 months

## 2020-03-11 NOTE — Progress Notes (Signed)
[facility] Clinic Note - Initial Visit   Date: 03/11/20  Elaine Buck MRN: [id number] DOB: 31-Aug-1955   Dear Dr. Nani Buck:  Thank you for your kind referral of Elaine Buck for consultation of neuropathy. Although her history is well known to you, please allow Elaine Buck to reiterate it for the purpose of our medical record. The patient was accompanied to the clinic by self.    History of Present Illness: Elaine Buck is a 64 y.o. right-handed female with left breast cancer, hyperlipidemia, tobacco use, and anxiety presenting for evaluation of left > right foot pain. In January 2020, she had left foot surgery and several months later, she began having burning and tingling in the left foot.  Tingling is constant and worse when her feet are suspended or not touching the ground.  It is also worse when she is sitting in the car. She has similar symptoms in the right foot, but not as intense. She had skin biopsy showing severe loss of nerve fiber density on the left and mild on the right.  NCS/EMG of the legs in 02/2019 was normal.  She has tried gabapentin 637m BID, nortriptyline 162m and cymbalta 6036mith no benefit. She does not drink alcohol and is not diabetic.   Out-side paper records, electronic medical record, and images have been reviewed where available and summarized as:  NCS/EMG 02/28/2019:  Normal  Skin biopsy 11/17/2019:  Right calf with mildly decreased nerve fiber density; left calf with severely decreased nerve fiber density  Labs 12/08/2019:  TSH 0.05*, free T4 1.7, SPEP with IFE no M protein, ESR 6   Past Medical History:  Diagnosis Date  . Anxiety   . Arthritis   . Cancer ([place]  breast (Left)  . History of colon polyps   . History of lobular carcinoma in situ (LCIS) of  left breast    s/p lumpectomy in 2005, Tamoxifen/Evista therapy for 5 years  . Hyperlipidemia   . Neuromuscular disorder ([facility]  small fiber neuropathy, L foot    . Thyroid disease     Past Surgical History:  Procedure Laterality Date  . BREAST BIOPSY    . BREAST LUMPECTOMY Left 05/22/2003   LCIS Cells found in breast tissue  . FOOT SURGERY Left 04/2019   joint inplantment in big toe   . TUBAL LIGATION       Medications:  Outpatient Encounter Medications as of 03/11/2020  Medication Sig  . Alpha-Lipoic Acid 100 MG CAPS Take by mouth.  . ALPRAZolam (XANAX) 0.5 MG tablet Take 1 tablet (0.5 mg total) by mouth daily as needed for anxiety.  . aMarland Kitchenorvastatin (LIPITOR) 10 MG tablet Take 1 tablet (10 mg total) by mouth daily.  . bMarland Kitchencomplex vitamins capsule Take 1 capsule by mouth daily.  . bMarland KitchenPROPion (WELLBUTRIN XL) 150 MG 24 hr tablet Take 1 tablet (150 mg total) by mouth daily.  . Calcium Carb-Cholecalciferol (CALCIUM 1000 + D PO) Take 1 tablet by mouth daily.  . Cholecalciferol (VITAMIN D3 PO) Take 1 tablet by mouth daily.  . DULoxetine (CYMBALTA) 60 MG capsule Take 1 capsule (60 mg total) by mouth daily.  . lMarland Kitchenvothyroxine (SYNTHROID) 125 MCG tablet Take 1 tablet (125 mcg total) by mouth daily.  . Multiple Vitamin (MULTIVITAMIN) tablet Take 1 tablet by mouth daily.  . Omega-3 Fatty Acids (FISH OIL PO) Take 1 tablet by mouth daily.   No facility-administered encounter medications on file as of 03/11/2020.    Allergies:  Allergies  Allergen Reactions  . Other Hives and Itching    Peaches    Family History: Family History  Problem Relation Age of Onset  . Stroke Mother   . Bipolar disorder Son   . ADD / ADHD Son   . Anxiety disorder Son   . Colon cancer Neg Hx   . Esophageal cancer Neg Hx   . Stomach cancer Neg Hx   . Rectal cancer Neg Hx     Social History: Social History   Tobacco Use  . Smoking status: Current Every Day Smoker    Packs/day: 0.80    Years: 20.00    Pack years: 16.00    Types: Cigarettes  . Smokeless tobacco: Never Used  . Tobacco comment: Declines quitting for now  Vaping Use  . Vaping Use: Never used   Substance Use Topics  . Alcohol use: Yes    Comment: Occasionlly  . Drug use: No   Social History   Social History Narrative   Left Handed   Lives in a one story home    Vital Signs:  BP 115/73   Pulse 77   Ht 5' 6" (1.676 m)   Wt 190 lb (86.2 kg)   SpO2 96%   BMI 30.67 kg/m   Neurological Exam: MENTAL STATUS including orientation to time, place, person, recent and remote memory, attention span and concentration, language, and fund of knowledge is normal.  Speech is not dysarthric.  CRANIAL NERVES: II:  No visual field defects.   III-IV-VI: Pupils equal round and reactive to light.  Normal conjugate, extra-ocular eye movements in all directions of gaze.  No nystagmus.  No ptosis.   V:  Normal facial sensation.    VII:  Normal facial symmetry and movements.   VIII:  Normal hearing and vestibular function.   IX-X:  Normal palatal movement.   XI:  Normal shoulder shrug and head rotation.   XII:  Normal tongue strength and range of motion, no deviation or fasciculation.  MOTOR:  No atrophy, fasciculations or abnormal movements.  No pronator drift.   Upper Extremity:  Right  Left  Deltoid  5/5   5/5   Biceps  5/5   5/5   Triceps  5/5   5/5   Infraspinatus 5/5  5/5  Medial pectoralis 5/5  5/5  Wrist extensors  5/5   5/5   Wrist flexors  5/5   5/5   Finger extensors  5/5   5/5   Finger flexors  5/5   5/5   Dorsal interossei  5/5   5/5   Abductor pollicis  5/5   5/5   Tone (Ashworth scale)  0  0   Lower Extremity:  Right  Left  Hip flexors  5/5   5/5   Hip extensors  5/5   5/5   Adductor 5/5  5/5  Abductor 5/5  5/5  Knee flexors  5/5   5/5   Knee extensors  5/5   5/5   Dorsiflexors  5/5   5/5   Plantarflexors  5/5   5/5   Toe extensors  5/5   5/5   Toe flexors  5/5   5/5   Tone (Ashworth scale)  0  0   MSRs:  Right        Left                  brachioradialis 2+  2+  biceps 2+  2+  triceps 2+    2+  patellar 2+  2+  ankle jerk 2+  2+  Hoffman no  no   plantar response down  down   SENSORY:  Hyperesthesia to pin prick and temperature over the dorsum of the left foot., intact on the right.  Vibration intact bilaterally.  Rhomberg signs negative  COORDINATION/GAIT: Normal finger-to- nose-finger.  Gait is slow, cautious due to pain, stable, and unassisted.   IMPRESSION: Small fiber neuropathy involving the left foot >> right foot.  Etiology unclear. She has surgery in the foot and the asymmetric findings raise the possibility of superimposed reflexive sympathetic dystrophy.  I had extensive discussion with the patient regarding the pathogenesis, etiology, management, and natural course of neuropathy. Neuropathy tends to be slowly progressive, especially if a treatable etiology is not identified.  I would like to test for treatable causes of neuropathy. I discussed that in the vast majority of cases, despite checking for reversible causes, we are unable to find the underlying etiology and management is symptomatic.   Previously tried subtherapeutic dosage of gabapentin 1215m/d, nortriptyline 160md, and Cymbalta 6022m Discussed the importance of achieving a therapeutic dose in order to determine whether medication is a true failure.  At this time, I will increase Cymbalta to 24m44m  Going forward, consider adding nortriptyline and titrating as able.   PLAN/RECOMMENDATIONS:  Check ANA, SSA/B, copper, folate Increase duloxetine to 24mg71mly  Return to clinic in 3 months.  Thank you for allowing me to participate in patient's care.  If I can answer any additional questions, I would be pleased to do so.    Sincerely,    Ovadia Lopp K. PatelPosey Pronto

## 2020-03-11 NOTE — Telephone Encounter (Signed)
Attempted to call patient- went to VM and left message to call back.

## 2020-03-11 NOTE — Telephone Encounter (Signed)
Patient has questions regarding surgery that you suggested and requested call back, please advise

## 2020-03-12 LAB — FOLATE: Folate: >23.6 ng/mL (ref >5.9)

## 2020-03-13 LAB — SJOGREN'S SYNDROME ANTIBODS(SSA + SSB)
SSA (Ro) (ENA) Antibody, IgG: <1 AI
SSB (La) (ENA) Antibody, IgG: <1 AI

## 2020-03-13 LAB — COPPER, SERUM: Copper: 95 ug/dL (ref 70–175)

## 2020-03-13 LAB — ANA: Anti Nuclear Antibody (ANA): NEGATIVE

## 2020-03-26 ENCOUNTER — Ambulatory Visit: Payer: Medicare Other | Admitting: Family Medicine

## 2020-04-11 ENCOUNTER — Other Ambulatory Visit: Payer: Self-pay

## 2020-04-11 ENCOUNTER — Other Ambulatory Visit: Payer: Medicare Other

## 2020-04-11 DIAGNOSIS — Z20822 Contact with and (suspected) exposure to covid-19: Secondary | ICD-10-CM

## 2020-04-12 ENCOUNTER — Ambulatory Visit: Payer: Medicare Other | Admitting: Podiatry

## 2020-04-13 LAB — SARS-COV-2, NAA 2 DAY TAT

## 2020-04-13 LAB — NOVEL CORONAVIRUS, NAA: SARS-CoV-2, NAA: NOT DETECTED

## 2020-04-19 ENCOUNTER — Ambulatory Visit: Payer: Medicare Other | Admitting: Podiatry

## 2020-04-19 ENCOUNTER — Other Ambulatory Visit: Payer: Self-pay

## 2020-04-19 DIAGNOSIS — G629 Polyneuropathy, unspecified: Secondary | ICD-10-CM

## 2020-04-19 DIAGNOSIS — M21611 Bunion of right foot: Secondary | ICD-10-CM

## 2020-04-19 DIAGNOSIS — M25872 Other specified joint disorders, left ankle and foot: Secondary | ICD-10-CM

## 2020-04-19 DIAGNOSIS — M2041 Other hammer toe(s) (acquired), right foot: Secondary | ICD-10-CM

## 2020-04-19 DIAGNOSIS — M779 Enthesopathy, unspecified: Secondary | ICD-10-CM | POA: Diagnosis not present

## 2020-04-19 DIAGNOSIS — M205X2 Other deformities of toe(s) (acquired), left foot: Secondary | ICD-10-CM

## 2020-04-19 NOTE — Patient Instructions (Signed)

## 2020-04-21 NOTE — Progress Notes (Signed)
Subjective: 65 year old female presents the office today for surgical consultation given chronic left foot pain.  She previously underwent a also bunionectomy performed by another physician.  She continues have discomfort she is however arthritic pain department first..  At that time I discussed approximately arthrodesis versus implant and wanted to have an implant in the future at that point.  Since the surgery she did well for short time however she is continued have discomfort pointing mostly to the medial sesamoid which is majority discomfort.  Discomfort back to the ball of her foot that she was having prior to the surgery.  Also she is tenderness on the right foot callus formation present as well as discomfort between her first and second toes. Denies any systemic complaints such as fevers, chills, nausea, vomiting. No acute changes since last appointment, and no other complaints at this time.   Objective: AAO x3, NAD DP/PT pulses palpable bilaterally, CRT less than 3 seconds On the left foot hyperkeratotic lesion under the medial sesamoid.  Decrease in function plantar flexion first MPJ.  Tenderness palpation mostly the sesamoid.  Hallux malleus is present which is rigid.  On the right foot bunion is present as well as hammertoe contractures.  There is no open lesions but there are hyperkeratotic lesions.  No pain with calf compression, swelling, warmth, erythema On the left foot there is equal temperature gradient bilaterally as well as color.   Assessment: Left foot prominent medial sesamoid/sesamoiditis; hallux rigidus; right foot bunion/hammertoes  Plan: -All treatment options discussed with the patient including all alternatives, risks, complications.  -In terms of left foot we discussed with conservative as well as surgical treatment options.  At this point is attempted numerous conservative treatments we discussed with surgical intervention.  She does not want an arthrodesis performed the  first MPJ.  Due to this discussed with her removal and exchange of implant with hallux IPJ fusion, excision of the medial sesamoid.  Discussed in detail is not a guarantee of resolution of symptoms but at this point due to the continued discomfort we will proceed with surgery. -The incision placement as well as the postoperative course was discussed with the patient. I discussed risks of the surgery which include, but not limited to, infection, bleeding, pain, swelling, need for further surgery, delayed or nonhealing, painful or ugly scar, numbness or sensation changes, over/under correction, recurrence, transfer lesions, further deformity, hardware failure, DVT/PE, loss of toe/foot. Patient understands these risks and wishes to proceed with surgery. The surgical consent was reviewed with the patient all 3 pages were signed. No promises or guarantees were given to the outcome of the procedure. All questions were answered to the best of my ability. Before the surgery the patient was encouraged to call the office if there is any further questions. The surgery will be performed at the [facility] on an outpatient basis. -Right foot-debrided hyperkeratotic lesions and complications including.  Continue offloading.  Continue orthotics. -Patient encouraged to call the office with any questions, concerns, change in symptoms.   [name]

## 2020-04-22 ENCOUNTER — Encounter: Payer: Self-pay | Admitting: Podiatry

## 2020-04-24 ENCOUNTER — Telehealth: Payer: Self-pay

## 2020-04-24 NOTE — Telephone Encounter (Signed)
Received surgery paperwork from the [place] office. Called and left a message for Shaquina to call and schedule surgery with Dr. Jacqualyn Posey.

## 2020-04-25 ENCOUNTER — Encounter: Payer: Self-pay | Admitting: Podiatry

## 2020-04-25 NOTE — Telephone Encounter (Signed)
Left another message for Yoali to call in reference to scheduling surgery with Dr. Jacqualyn Posey.

## 2020-05-03 ENCOUNTER — Encounter: Payer: Self-pay | Admitting: Podiatry

## 2020-05-08 ENCOUNTER — Telehealth: Payer: Self-pay | Admitting: Podiatry

## 2020-05-08 NOTE — Telephone Encounter (Signed)
DOS: 05/15/2020  Procedures: Keller/McBride Bunionectomy Rt ([id number]), Sesamoidectomy Lt ([id number]), and Hallux IPJ Fusion Lt ([id number])  [employer] Medicare Effective 03/23/2020 - Present  Deductible: $0 Out of Pocket: $4,500 with $35 met and $4,465 remaining. CoInsurance: 100% Copay: $225 for Tier 1 I $325 for Tier 2  Notification or Prior Authorization is not required for the requested services  Decision ID #:[id number]  The number above acknowledges your inquiry and our response. Please write this number down and refer to it for future inquiries. Coverage and payment for an item or service is governed by the member's benefit plan document, and, if applicable, the provider's participation agreement with the Health Plan.

## 2020-05-15 ENCOUNTER — Other Ambulatory Visit: Payer: Self-pay | Admitting: Podiatry

## 2020-05-15 ENCOUNTER — Encounter: Payer: Self-pay | Admitting: Podiatry

## 2020-05-15 DIAGNOSIS — M2022 Hallux rigidus, left foot: Secondary | ICD-10-CM | POA: Diagnosis not present

## 2020-05-15 DIAGNOSIS — M25572 Pain in left ankle and joints of left foot: Secondary | ICD-10-CM | POA: Diagnosis not present

## 2020-05-15 DIAGNOSIS — M2011 Hallux valgus (acquired), right foot: Secondary | ICD-10-CM | POA: Diagnosis not present

## 2020-05-15 DIAGNOSIS — M205X2 Other deformities of toe(s) (acquired), left foot: Secondary | ICD-10-CM | POA: Diagnosis not present

## 2020-05-15 DIAGNOSIS — M84872 Other disorders of continuity of bone, left ankle and foot: Secondary | ICD-10-CM | POA: Diagnosis not present

## 2020-05-15 DIAGNOSIS — M25872 Other specified joint disorders, left ankle and foot: Secondary | ICD-10-CM | POA: Diagnosis not present

## 2020-05-15 DIAGNOSIS — M2032 Hallux varus (acquired), left foot: Secondary | ICD-10-CM | POA: Diagnosis not present

## 2020-05-15 MED ORDER — PROMETHAZINE HCL 25 MG PO TABS: 25.0000 mg | ORAL_TABLET | Freq: Three times a day (TID) | ORAL | 0 refills | Status: DC | PRN

## 2020-05-15 MED ORDER — OXYCODONE-ACETAMINOPHEN 10-325 MG PO TABS: 1.0000 | ORAL_TABLET | Freq: Four times a day (QID) | ORAL | 0 refills | Status: DC | PRN

## 2020-05-15 MED ORDER — CEPHALEXIN 500 MG PO CAPS: 500.0000 mg | ORAL_CAPSULE | Freq: Three times a day (TID) | ORAL | 0 refills | Status: DC

## 2020-05-15 NOTE — Progress Notes (Signed)
Postop medications sent 

## 2020-05-16 ENCOUNTER — Telehealth: Payer: Self-pay | Admitting: *Deleted

## 2020-05-16 NOTE — Telephone Encounter (Signed)
Called and left a message for the patient stating that I was calling to see how patient was doing after having surgery with Dr Jacqualyn Posey on 05-15-2020 and to call the office if any concerns or questions. Lattie Haw

## 2020-05-20 ENCOUNTER — Ambulatory Visit (INDEPENDENT_AMBULATORY_CARE_PROVIDER_SITE_OTHER): Payer: Medicare Other | Admitting: Podiatry

## 2020-05-20 ENCOUNTER — Other Ambulatory Visit: Payer: Self-pay

## 2020-05-20 ENCOUNTER — Ambulatory Visit (INDEPENDENT_AMBULATORY_CARE_PROVIDER_SITE_OTHER): Payer: Medicare Other

## 2020-05-20 VITALS — Temp 98.0°F

## 2020-05-20 DIAGNOSIS — M25872 Other specified joint disorders, left ankle and foot: Secondary | ICD-10-CM

## 2020-05-20 DIAGNOSIS — M779 Enthesopathy, unspecified: Secondary | ICD-10-CM

## 2020-05-20 MED ORDER — HYDROMORPHONE HCL 2 MG PO TABS: 2.0000 mg | ORAL_TABLET | ORAL | 0 refills | Status: DC | PRN

## 2020-05-20 NOTE — Progress Notes (Signed)
Subjective: Elaine Buck is a 65 y.o. is seen today in office s/p left foot replacement of 1st MTPJ implant, IPJ fusion and sesamoidectomy preformed on 05/15/2020.  She states that she still having quite a bit of discomfort.  Her pain level is 8/10.  She does not feel the Percocet has been helping.  She is been in the cam boot using crutches to take pressure off the foot.  She has been icing elevating.  No recent injury or falls. Denies any systemic complaints such as fevers, chills, nausea, vomiting. No calf pain, chest pain, shortness of breath.   Objective: General: No acute distress, AAOx3  DP/PT pulses palpable 2/4, CRT < 3 sec to all digits.  Protective sensation intact. Motor function intact.  LEFT foot: Incision is well coapted without any evidence of dehiscence with suture intact. There is no surrounding erythema, ascending cellulitis, fluctuance, crepitus, malodor, drainage/purulence. K-wire intact to the hallux without any drainage or pus. There is mild edema around the surgical site. There is moderate pain along the surgical site.  No evidence of any fluid collection.  No evidence of infection.  The hallux tested in slight varus position which we discussed today as well as postoperative wound. No other areas of tenderness to bilateral lower extremities.  No other open lesions or pre-ulcerative lesions.  No pain with calf compression, swelling, warmth, erythema.   Assessment and Plan:  Status post , doing well with no complications   -Treatment options discussed including all alternatives, risks, and complications -X-rays obtained reviewed.  Hardware intact.  No evidence of acute fracture.  On x-ray does not appear that the MPJ implant is contacting the bone on metatarsal, proximal phalanx but clinically during surgery it was.  I think on x-ray it is because of the removal of the old implant. -Her pain is more hold off on Percocet I ordered Dilaudid 2 mg. -Ice/elevation -Monitor for  any clinical signs or symptoms of infection and DVT/PE and directed to call the office immediately should any occur or go to the ER. -Follow-up as scheduled or sooner if any problems arise. In the meantime, encouraged to call the office with any questions, concerns, change in symptoms.   Celesta Gentile, DPM

## 2020-05-23 ENCOUNTER — Other Ambulatory Visit: Payer: Self-pay | Admitting: Family Medicine

## 2020-05-23 DIAGNOSIS — E039 Hypothyroidism, unspecified: Secondary | ICD-10-CM

## 2020-05-23 MED ORDER — BUPROPION HCL ER (XL) 150 MG PO TB24: 150.0000 mg | ORAL_TABLET | Freq: Every day | ORAL | 1 refills | Status: DC

## 2020-05-27 ENCOUNTER — Ambulatory Visit (INDEPENDENT_AMBULATORY_CARE_PROVIDER_SITE_OTHER): Payer: Medicare Other | Admitting: Podiatry

## 2020-05-27 ENCOUNTER — Other Ambulatory Visit: Payer: Self-pay

## 2020-05-27 DIAGNOSIS — M779 Enthesopathy, unspecified: Secondary | ICD-10-CM

## 2020-05-27 DIAGNOSIS — M25872 Other specified joint disorders, left ankle and foot: Secondary | ICD-10-CM

## 2020-05-27 DIAGNOSIS — M205X2 Other deformities of toe(s) (acquired), left foot: Secondary | ICD-10-CM

## 2020-05-31 ENCOUNTER — Encounter: Payer: Medicare Other | Admitting: Podiatry

## 2020-05-31 NOTE — Progress Notes (Signed)
Subjective: Elaine Buck is a 65 y.o. is seen today in office s/p left foot replacement of 1st MTPJ implant, IPJ fusion and sesamoidectomy preformed on 05/15/2020.  She presents here for possible suture removal.  Overall she states that she is feeling much better her pain is much better controlled.  She has been using the cam boot and transfer out of the foot is much as possible.  She denies any recent injury or falls or changes otherwise since I last saw her.  Denies any systemic complaints such as fevers, chills, nausea, vomiting. No calf pain, chest pain, shortness of breath.   Objective: General: No acute distress, AAOx3  DP/PT pulses palpable 2/4, CRT < 3 sec to all digits.  Protective sensation intact. Motor function intact.  LEFT foot: Incision is well coapted without any evidence of dehiscence with suture intact. There is no surrounding erythema, ascending cellulitis, fluctuance, crepitus, malodor, drainage/purulence. K-wire intact to the hallux without any drainage or pus. There is mild however improved edema around the surgical site. There is slight discomfort on the surgical site but however this is much improved as well.  No evidence of infection.  The hallux is in slight varus position.  No other areas of tenderness to bilateral lower extremities.  No other open lesions or pre-ulcerative lesions.  No pain with calf compression, swelling, warmth, erythema.   Assessment and Plan:  Status post , doing well with no complications   -Treatment options discussed including all alternatives, risks, and complications --The sutures were removed today.  I brought the distal portion of the sutures intact given there is some scab along the area. -Antibiotic ointment and a dressing applied.  Keep the dressing clean, dry, intact.  If needed she can change it with a similar bandage.  I would continue to ice elevate.  Continue cam boot. -Ice/elevation -Monitor for any clinical signs or symptoms of  infection and DVT/PE and directed to call the office immediately should any occur or go to the ER. -Follow-up as scheduled or sooner if any problems arise. In the meantime, encouraged to call the office with any questions, concerns, change in symptoms.   Celesta Gentile, DPM

## 2020-06-04 ENCOUNTER — Ambulatory Visit (INDEPENDENT_AMBULATORY_CARE_PROVIDER_SITE_OTHER): Payer: Medicare Other

## 2020-06-04 ENCOUNTER — Ambulatory Visit (INDEPENDENT_AMBULATORY_CARE_PROVIDER_SITE_OTHER): Payer: Medicare Other | Admitting: Podiatry

## 2020-06-04 ENCOUNTER — Other Ambulatory Visit: Payer: Self-pay

## 2020-06-04 DIAGNOSIS — M205X2 Other deformities of toe(s) (acquired), left foot: Secondary | ICD-10-CM

## 2020-06-04 DIAGNOSIS — M25872 Other specified joint disorders, left ankle and foot: Secondary | ICD-10-CM

## 2020-06-10 ENCOUNTER — Encounter: Payer: Self-pay | Admitting: Neurology

## 2020-06-10 ENCOUNTER — Ambulatory Visit: Payer: Medicare Other | Admitting: Neurology

## 2020-06-10 ENCOUNTER — Other Ambulatory Visit: Payer: Self-pay

## 2020-06-10 VITALS — BP 133/79 | HR 101 | Ht 66.0 in | Wt 197.0 lb

## 2020-06-10 DIAGNOSIS — G629 Polyneuropathy, unspecified: Secondary | ICD-10-CM | POA: Diagnosis not present

## 2020-06-10 DIAGNOSIS — Z79899 Other long term (current) drug therapy: Secondary | ICD-10-CM

## 2020-06-10 MED ORDER — GABAPENTIN 300 MG PO CAPS: 600.0000 mg | ORAL_CAPSULE | Freq: Two times a day (BID) | ORAL | 3 refills | Status: DC

## 2020-06-10 NOTE — Progress Notes (Signed)
  Follow-up Visit   Date: 06/10/20   Elaine Buck MRN: [id number] DOB: 08/08/1955   Interim History: Elaine Buck is a 65 y.o. right-handed female with left breast cancer, hyperlipidemia, tobacco use, and anxiety returning to the clinic for follow-up of small fiber neuropathy.  The patient was accompanied to the clinic by self.  History of present illness: In January 2020, she had left foot surgery and several months later, she began having burning and tingling in the left foot.  Tingling is constant and worse when her feet are suspended or not touching the ground.  It is also worse when she is sitting in the car. She has similar symptoms in the right foot, but not as intense. She had skin biopsy showing severe loss of nerve fiber density on the left and mild on the right.  NCS/EMG of the legs in 02/2019 was normal.  She has tried gabapentin 600mg BID, nortriptyline 10mg, and cymbalta 60mg with no benefit. She does not drink alcohol and is not diabetic.   UPDATE 06/10/2020:  She is here for follow-up visit. At her last visit, Cymbalta was increased to 90mg daily but she denied having any pain relief.  She would like to try gabapentin.  She also underwent left great toe surgery and is wearing a boot. She still has a surgical wire in the toe.  Medications:  Current Outpatient Medications on File Prior to Visit  Medication Sig Dispense Refill  . ALPRAZolam (XANAX) 0.5 MG tablet Take 1 tablet (0.5 mg total) by mouth daily as needed for anxiety. 30 tablet 5  . atorvastatin (LIPITOR) 10 MG tablet Take 1 tablet (10 mg total) by mouth daily. 90 tablet 3  . b complex vitamins capsule Take 1 capsule by mouth daily.    . buPROPion (WELLBUTRIN XL) 150 MG 24 hr tablet Take 1 tablet (150 mg total) by mouth daily. 90 tablet 1  . Calcium Carb-Cholecalciferol (CALCIUM 1000 + D PO) Take 1 tablet by mouth daily.    . Cholecalciferol (VITAMIN D3 PO) Take 1 tablet by mouth daily.    . DULoxetine  (CYMBALTA) 30 MG capsule Take 1 capsule (30 mg total) by mouth daily. 30 capsule 3  . DULoxetine (CYMBALTA) 60 MG capsule Take 1 capsule (60 mg total) by mouth daily. 30 capsule 3  . levothyroxine (SYNTHROID) 125 MCG tablet TAKE 1 TABLET BY MOUTH  DAILY 90 tablet 0  . Multiple Vitamin (MULTIVITAMIN) tablet Take 1 tablet by mouth daily.    . Omega-3 Fatty Acids (FISH OIL PO) Take 1 tablet by mouth daily.    . Alpha-Lipoic Acid 100 MG CAPS Take by mouth.    . cephALEXin (KEFLEX) 500 MG capsule Take 1 capsule (500 mg total) by mouth 3 (three) times daily. 21 capsule 0  . HYDROmorphone (DILAUDID) 2 MG tablet Take 1 tablet (2 mg total) by mouth every 4 (four) hours as needed for severe pain. (Patient not taking: Reported on 06/10/2020) 20 tablet 0  . oxyCODONE-acetaminophen (PERCOCET) 10-325 MG tablet Take 1 tablet by mouth every 6 (six) hours as needed for pain. MAXIMUM TOTAL ACETAMINOPHEN DOSE IS 4000 MG PER DAY (Patient not taking: Reported on 06/10/2020) 20 tablet 0  . promethazine (PHENERGAN) 25 MG tablet Take 1 tablet (25 mg total) by mouth every 8 (eight) hours as needed for nausea or vomiting. 20 tablet 0   No current facility-administered medications on file prior to visit.    Allergies:  Allergies  Allergen Reactions  . Other   Hives and Itching    Peaches    Vital Signs:  BP 133/79   Pulse (!) 101   Ht 5' 6" (1.676 m)   Wt 197 lb (89.4 kg)   SpO2 95%   BMI 31.80 kg/m   Neurological Exam: MENTAL STATUS including orientation to time, place, person, recent and remote memory, attention span and concentration, language, and fund of knowledge is normal.  Speech is not dysarthric.  CRANIAL NERVES: Normal conjugate, extra-ocular eye movements in all directions of gaze.  No ptosis   MOTOR:  Motor strength is 5/5 in all extremities. There is a small surgical wire at the tip of her left great toe.   No atrophy, fasciculations or abnormal movements.  No pronator drift.  Tone is normal.     MSRs:  Reflexes are 2+/4 throughout.  SENSORY:  Intact to vibration throughout, except absent in the left foot.  COORDINATION/GAIT: Gait appears stable.   Data: NCS/EMG 02/28/2019:  Normal  Skin biopsy 11/17/2019:  Right calf with mildly decreased nerve fiber density; left calf with severely decreased nerve fiber density  Labs 12/08/2019:  TSH 0.05*, free T4 1.7, SPEP with IFE no M protein, ESR 6 Labs 03/11/2020: SSA/B, ANA neg   IMPRESSION/PLAN: 1. Small fiber neuropathy involving the left >> right foot.  Unclear etiology.  Labs reviewed and normal. 2. Possible overlapping left reflexive sympathetic dystrophy  Previously tried subtherapeutic dosage of gabapentin 124m/d, nortriptyline 149md, and Cymbalta 966m   PLAN/RECOMMENDATIONS:  Taper off Cymbalta by [id number] Start gabapentin 300mg69mbedtime and titrate by 300mg 66my 3 days to a goal of 600mg t33m daily  Return to clinic in 6 months.   Thank you for allowing me to participate in patient's care.  If I can answer any additional questions, I would be pleased to do so.    Sincerely,    Virgie Kunda K. [place] Segler, Posey Pronto

## 2020-06-10 NOTE — Patient Instructions (Addendum)
Reduce Cymbalta 60mg  x 1 week, then 30mg  x 1 week, stop.  Start gabapentin 300mg  at bedtime x 3 days, then 300mg  twice daily x 3 days, then 300mg  in the morning and 600mg  at bedtime, then 600mg  twice daily  Return to clinic in 6 months

## 2020-06-10 NOTE — Progress Notes (Signed)
Subjective: Elaine Buck is a 65 y.o. is seen today in office s/p left foot replacement of 1st MTPJ implant, IPJ fusion and sesamoidectomy preformed on 05/15/2020.  She presents today for suture removal of the remainder of the sutures.  Otherwise has been doing well not had any significant discomfort.  He is in the cam boot.  Denies any drainage or pus or any bleeding.  She has no other concerns today.  Denies any systemic complaints such as fevers, chills, nausea, vomiting. No calf pain, chest pain, shortness of breath.   Objective: General: No acute distress, AAOx3  DP/PT pulses palpable 2/4, CRT < 3 sec to all digits.  Protective sensation intact. Motor function intact.  LEFT foot: Incision is well coapted without any evidence of dehiscence with suture intact at the distal aspect.  Upon removal of the sutures the incision remained well coapted without any signs of infection or dehiscence.  There is no surrounding erythema, ascending cellulitis but no fluctuation crepitation there is no malodor. The hallux is in slight varus position.  No other areas of tenderness to bilateral lower extremities.  No other open lesions or pre-ulcerative lesions.  No pain with calf compression, swelling, warmth, erythema.   Assessment and Plan:  Status post , doing well with no complications   -Treatment options discussed including all alternatives, risks, and complications -X-rays obtained reviewed.  Status post first MPJ arthroplasty with implant hardware to the first IPJ. -Remove the remainder the sutures today.  Incision is very well coapted.  Left the K wire intact. -Continue cam boot.  Discussed range of motion exercises for the first MPJ. -Ice/elevation -Monitor for any clinical signs or symptoms of infection and DVT/PE and directed to call the office immediately should any occur or go to the ER. -Follow-up as scheduled or sooner if any problems arise. In the meantime, encouraged to call the office with  any questions, concerns, change in symptoms.   *Repeat x-ray next appointment possible removal of K wire.  Celesta Gentile, DPM

## 2020-06-14 ENCOUNTER — Telehealth: Payer: Self-pay | Admitting: Family Medicine

## 2020-06-14 ENCOUNTER — Encounter: Payer: Medicare Other | Admitting: Podiatry

## 2020-06-14 MED ORDER — TIZANIDINE HCL 4 MG PO TABS: 4.0000 mg | ORAL_TABLET | Freq: Four times a day (QID) | ORAL | 0 refills | Status: DC | PRN

## 2020-06-14 NOTE — Telephone Encounter (Signed)
Pt would like a muscle relaxer  sent to [facility] on [address]  Please advice

## 2020-06-14 NOTE — Telephone Encounter (Signed)
SentMerlyn Buck f/u on Mon plz. Ty.

## 2020-06-14 NOTE — Telephone Encounter (Signed)
Called the patient informed prescription sent in. Scheduled appointment for Monday

## 2020-06-17 ENCOUNTER — Encounter: Payer: Self-pay | Admitting: Family Medicine

## 2020-06-17 ENCOUNTER — Ambulatory Visit (INDEPENDENT_AMBULATORY_CARE_PROVIDER_SITE_OTHER): Payer: Medicare Other | Admitting: Family Medicine

## 2020-06-17 ENCOUNTER — Other Ambulatory Visit: Payer: Self-pay

## 2020-06-17 VITALS — BP 108/70 | HR 89 | Temp 98.2°F | Ht 66.0 in | Wt 198.0 lb

## 2020-06-17 DIAGNOSIS — M545 Low back pain, unspecified: Secondary | ICD-10-CM

## 2020-06-17 MED ORDER — METHYLPREDNISOLONE ACETATE 80 MG/ML IJ SUSP
80.0000 mg | Freq: Once | INTRAMUSCULAR | Status: AC
Start: 2020-06-17 — End: 2020-06-17
  Administered 2020-06-17: 80 mg via INTRAMUSCULAR

## 2020-06-17 MED ORDER — MELOXICAM 15 MG PO TABS: 15.0000 mg | ORAL_TABLET | Freq: Every day | ORAL | 0 refills | Status: DC

## 2020-06-17 MED ORDER — CYCLOBENZAPRINE HCL 10 MG PO TABS
5.0000 mg | ORAL_TABLET | Freq: Three times a day (TID) | ORAL | 0 refills | Status: DC | PRN
Start: 2020-06-17 — End: 2020-08-07

## 2020-06-17 MED ORDER — OXYCODONE HCL 5 MG PO CAPS
5.0000 mg | ORAL_CAPSULE | Freq: Four times a day (QID) | ORAL | 0 refills | Status: DC | PRN
Start: 2020-06-17 — End: 2020-08-07

## 2020-06-17 NOTE — Addendum Note (Signed)
Addended by: Sharon Seller B on: 06/17/2020 10:27 AM   Modules accepted: Orders

## 2020-06-17 NOTE — Progress Notes (Signed)
Musculoskeletal Exam  Patient: Elaine Buck DOB: [birth year]  DOS: 06/17/2020  SUBJECTIVE:  Chief Complaint:  Low back pain  Elaine Buck is a 65 y.o.  female for evaluation and treatment of back pain.   Onset:  4 days ago.  Fell and twisted back.  Location: lower b/l; radiating down thighs Character:  sharp  Progression of issue:  is unchanged Associated symptoms: no bruising, redness, swelling Denies bowel/bladder incontinence or weakness Treatment: to date has been OTC NSAIDS, heat, and muscle relaxers.   Neurovascular symptoms: no  Past Medical History:  Diagnosis Date  . Anxiety   . Arthritis   . Cancer ([place])    breast (Left)  . History of colon polyps   . History of lobular carcinoma in situ (LCIS) of  left breast    s/p lumpectomy in 2005, Tamoxifen/Evista therapy for 5 years  . Hyperlipidemia   . Neuromuscular disorder ([place])    small fiber neuropathy, L foot   . Thyroid disease     Objective:  VITAL SIGNS: BP 108/70 (BP Location: Left Arm, Patient Position: Sitting, Cuff Size: Normal)   Pulse 89   Temp 98.2 F (36.8 C) (Oral)   Ht 5\' 6"  (1.676 m)   Wt 198 lb (89.8 kg)   SpO2 98%   BMI 31.96 kg/m  Constitutional: Well formed, well developed. No acute distress. HENT: Normocephalic, atraumatic.  Thorax & Lungs:  No accessory muscle use Musculoskeletal: low back.   Tenderness to palpation: yes over parasp msc and ES group b/l Deformity: no Ecchymosis: no Straight leg test: negative for Poor hamstring flexibility b/l. Neurologic: Normal sensory function. No focal deficits noted. DTR's equal and symmetric in LE's. No clonus. Psychiatric: Normal mood. Age appropriate judgment and insight. Alert & oriented x 3.    Assessment:  Acute bilateral low back pain without sciatica - Plan: meloxicam (MOBIC) 15 MG tablet, cyclobenzaprine (FLEXERIL) 10 MG tablet, oxycodone (OXY-IR) 5 MG capsule  Plan: Stretches/exercises, heat, ice, Tylenol, NSAIDs. Warnings  about Flexeril and oxy written down. Send message if no better in the next week. Depo injection today, start Mobic tomorrow.  F/u prn. The patient voiced understanding and agreement to the plan.   [place], DO 06/17/20  10:19 AM

## 2020-06-17 NOTE — Patient Instructions (Addendum)

## 2020-06-18 ENCOUNTER — Ambulatory Visit (INDEPENDENT_AMBULATORY_CARE_PROVIDER_SITE_OTHER): Payer: Medicare Other | Admitting: Podiatry

## 2020-06-18 ENCOUNTER — Encounter: Payer: Self-pay | Admitting: Podiatry

## 2020-06-18 ENCOUNTER — Ambulatory Visit (INDEPENDENT_AMBULATORY_CARE_PROVIDER_SITE_OTHER): Payer: Medicare Other

## 2020-06-18 DIAGNOSIS — M205X2 Other deformities of toe(s) (acquired), left foot: Secondary | ICD-10-CM | POA: Diagnosis not present

## 2020-06-18 DIAGNOSIS — M779 Enthesopathy, unspecified: Secondary | ICD-10-CM

## 2020-06-18 DIAGNOSIS — M25872 Other specified joint disorders, left ankle and foot: Secondary | ICD-10-CM

## 2020-06-20 NOTE — Progress Notes (Signed)
Subjective: Elaine Buck is a 65 y.o. is seen today in office s/p left foot replacement of 1st MTPJ implant, IPJ fusion and sesamoidectomy preformed on 05/15/2020.  She presents today for K wire removed.  She states that she is doing well and she already feels better at this point postoperatively than she did previously after her other surgery.  She states the foot feels great but she still in the cam boot.  No recent injury or falls but she is eager to get the K wire out. Denies any systemic complaints such as fevers, chills, nausea, vomiting. No calf pain, chest pain, shortness of breath.   Objective: General: No acute distress, AAOx3  DP/PT pulses palpable 2/4, CRT < 3 sec to all digits.  Protective sensation intact. Motor function intact.  LEFT foot: Incision is well coapted without any evidence of dehiscence and a scar is formed.  K wire intact to the hallux.  There is minimal edema but there is no erythema or warmth there is no drainage or pus or any obvious signs of infection.  Slight varus deformity of the hallux.  No other open lesions or pre-ulcerative lesions.  No pain with calf compression, swelling, warmth, erythema.   Assessment and Plan:  Status post left foot surgery, doing well with no complications   -Treatment options discussed including all alternatives, risks, and complications -X-rays obtained reviewed.  Status post first MPJ arthroplasty with implant hardware to the first IPJ. -Clean the K wire and this was removed in toto without complications. -Antibiotic ointment and bandage applied.  Discussed with her range of motion exercises.  Discussed that like her to stay in the cam boot for another week.  Then she can slowly start to transition to regular shoe as tolerated.  Okay for her to wear the orthotic. -Monitor for any clinical signs or symptoms of infection and directed to call the office immediately should any occur or go to the ER.  No follow-ups on file.  [name]

## 2020-07-16 MED ORDER — VENLAFAXINE HCL ER 150 MG PO CP24: 150.0000 mg | ORAL_CAPSULE | Freq: Every day | ORAL | 1 refills | Status: DC

## 2020-07-17 ENCOUNTER — Encounter: Payer: Self-pay | Admitting: Radiology

## 2020-07-19 ENCOUNTER — Other Ambulatory Visit: Payer: Self-pay

## 2020-07-19 ENCOUNTER — Ambulatory Visit (INDEPENDENT_AMBULATORY_CARE_PROVIDER_SITE_OTHER): Payer: Medicare Other | Admitting: Podiatry

## 2020-07-19 ENCOUNTER — Encounter: Payer: Self-pay | Admitting: Podiatry

## 2020-07-19 DIAGNOSIS — M205X2 Other deformities of toe(s) (acquired), left foot: Secondary | ICD-10-CM

## 2020-07-19 DIAGNOSIS — M25872 Other specified joint disorders, left ankle and foot: Secondary | ICD-10-CM

## 2020-07-19 DIAGNOSIS — M779 Enthesopathy, unspecified: Secondary | ICD-10-CM

## 2020-07-20 NOTE — Progress Notes (Signed)
Subjective: Elaine Buck is a 65 y.o. is seen today in office s/p left foot replacement of 1st MTPJ implant, IPJ fusion and sesamoidectomy preformed on 05/15/2020.  She presents today wearing a regular shoe.  She states that she is in a regular sneaker about 5 hours a day.  She still feels that there has been some swelling in her foot feels like it is being squished on the toes.  She does get pain in the ball of her foot but along submetatarsal 3 through 5 that she points mostly to.  She denies any recent injury.  She has been doing exercises at home.   She is also still on treatment for neuropathy.  She is currently on gabapentin 1200 mg.  She does follow-up with neurology for this.  Her neuropathy symptoms are asymmetrical which is unusual.  Denies any systemic complaints such as fevers, chills, nausea, vomiting. No calf pain, chest pain, shortness of breath.   Objective: General: No acute distress, AAOx3  DP/PT pulses palpable 2/4, CRT < 3 sec to all digits.  Protective sensation intact. Motor function intact.  LEFT foot: Incision is well coapted without any evidence of dehiscence and a scar is formed.  There is minimal edema still evident but there is no erythema or warmth there is no drainage or pus or any obvious signs of infection.  Slight varus deformity of the hallux.  There is tenderness palpation of the sesamoids.  The majority of discomfort that she gets a submetatarsal 2 through 5. No other open lesions or pre-ulcerative lesions.  No pain with calf compression, swelling, warmth, erythema.   Assessment and Plan:  Status post left foot surgery, neuropathy  -Treatment options discussed including all alternatives, risks, and complications -Today we had a long discussion regards to surgery as well as neuropathy.  For now I want to continue the current medications for neuropathy without change and then to see if it can be beneficial for her.  I want her to continue to try to transition to  regular shoe.  When taken off the orthotic that she really had made previously where to just do a regular insert with a metatarsal pad.  Gave her various metatarsal pads for offloading.  Discussed with her hold off on exercise until she is back into a tennis shoe full-time before she gradually increases this. -It is unusual that her neuropathy is asymmetrical.  I discussed different etiologies of nerve issues including CRPS today.  Due to concerns for other nerve issues I do want to continue with gabapentin, therapy and trying to stay as active as possible.  Once we get the foot to improve we discussed possibly coming off of these medications to see if there is any difference.  [name]

## 2020-08-07 ENCOUNTER — Ambulatory Visit (INDEPENDENT_AMBULATORY_CARE_PROVIDER_SITE_OTHER): Payer: Medicare Other | Admitting: Family Medicine

## 2020-08-07 ENCOUNTER — Other Ambulatory Visit: Payer: Self-pay

## 2020-08-07 ENCOUNTER — Encounter: Payer: Self-pay | Admitting: Family Medicine

## 2020-08-07 VITALS — BP 123/71 | HR 71 | Temp 98.4°F | Resp 20 | Ht 66.0 in | Wt 192.0 lb

## 2020-08-07 DIAGNOSIS — E039 Hypothyroidism, unspecified: Secondary | ICD-10-CM | POA: Diagnosis not present

## 2020-08-07 DIAGNOSIS — F419 Anxiety disorder, unspecified: Secondary | ICD-10-CM | POA: Diagnosis not present

## 2020-08-07 DIAGNOSIS — M7711 Lateral epicondylitis, right elbow: Secondary | ICD-10-CM | POA: Diagnosis not present

## 2020-08-07 DIAGNOSIS — E785 Hyperlipidemia, unspecified: Secondary | ICD-10-CM

## 2020-08-07 LAB — COMPREHENSIVE METABOLIC PANEL
ALT: 19 U/L (ref 0–35)
AST: 17 U/L (ref 0–37)
Albumin: 4.3 g/dL (ref 3.5–5.2)
Alkaline Phosphatase: 62 U/L (ref 39–117)
BUN: 18 mg/dL (ref 6–23)
CO2: 30 mEq/L (ref 19–32)
Calcium: 8.7 mg/dL (ref 8.4–10.5)
Chloride: 106 mEq/L (ref 96–112)
Creatinine, Ser: 0.74 mg/dL (ref 0.40–1.20)
GFR: 84.66 mL/min (ref >60.00)
Glucose, Bld: 95 mg/dL (ref 70–99)
Potassium: 4.5 mEq/L (ref 3.5–5.1)
Sodium: 142 mEq/L (ref 135–145)
Total Bilirubin: 0.4 mg/dL (ref 0.2–1.2)
Total Protein: 6.4 g/dL (ref 6.0–8.3)

## 2020-08-07 LAB — LIPID PANEL
Cholesterol: 158 mg/dL (ref 0–200)
HDL: 48.6 mg/dL (ref >39.00)
LDL Cholesterol: 86 mg/dL (ref 0–99)
NonHDL: 108.94
Total CHOL/HDL Ratio: 3
Triglycerides: 115 mg/dL (ref 0.0–149.0)
VLDL: 23 mg/dL (ref 0.0–40.0)

## 2020-08-07 LAB — T4, FREE: Free T4: 0.97 ng/dL (ref 0.60–1.60)

## 2020-08-07 LAB — TSH: TSH: 0.05 u[IU]/mL — ABNORMAL LOW (ref 0.35–4.50)

## 2020-08-07 MED ORDER — LEVOTHYROXINE SODIUM 125 MCG PO TABS: 125.0000 ug | ORAL_TABLET | Freq: Every day | ORAL | 3 refills | Status: DC

## 2020-08-07 MED ORDER — ALPRAZOLAM 0.5 MG PO TABS
0.5000 mg | ORAL_TABLET | Freq: Every day | ORAL | 5 refills | Status: DC | PRN
Start: 2020-08-07 — End: 2022-07-17

## 2020-08-07 MED ORDER — ATORVASTATIN CALCIUM 10 MG PO TABS: 10.0000 mg | ORAL_TABLET | Freq: Every day | ORAL | 3 refills | Status: DC

## 2020-08-07 MED ORDER — VENLAFAXINE HCL ER 150 MG PO CP24: 150.0000 mg | ORAL_CAPSULE | Freq: Every day | ORAL | 3 refills | Status: DC

## 2020-08-07 NOTE — Progress Notes (Signed)
Chief Complaint  Patient presents with  . Follow-up    6 month     Subjective Elaine Buck presents for f/u anxiety/depression.  Pt is currently being treated with Effexor XR 150 mg/d and Xanax prn.  Takes 2 Xanax per week on average.  Reports doing well since treatment. No thoughts of harming self or others. No self-medication with alcohol, prescription drugs or illicit drugs. Pt is not following with a counselor/psychologist.  Hyperlipidemia Patient presents for dyslipidemia follow up. Currently being treated with Lipitor 10 mg/d and compliance with treatment thus far has been good. She denies myalgias. She is adhering to a healthy diet. Exercise: walking The patient is not known to have coexisting coronary artery disease.   Hypothyroidism Patient presents for follow-up of hypothyroidism.  Reports compliance with medication- levothyroxine 125 mcg/d. Current symptoms include: denies fatigue, weight changes, heat/cold intolerance, bowel/skin changes or CVS symptoms  Past Medical History:  Diagnosis Date  . Anxiety   . Arthritis   . Cancer ([place])    breast (Left)  . History of colon polyps   . History of lobular carcinoma in situ (LCIS) of  left breast    s/p lumpectomy in 2005, Tamoxifen/Evista therapy for 5 years  . Hyperlipidemia   . Neuromuscular disorder ([place])    small fiber neuropathy, L foot   . Thyroid disease    Allergies as of 08/07/2020      Reactions   Other Hives, Itching   Peaches   Prunus Persica       Medication List       Accurate as of Aug 07, 2020  8:13 AM. If you have any questions, ask your nurse or doctor.        STOP taking these medications   baclofen 10 MG tablet Commonly known as: LIORESAL Stopped by: Shelda Pal, DO   buPROPion 150 MG 24 hr tablet Commonly known as: Wellbutrin XL Stopped by: Shelda Pal, DO   cyclobenzaprine 10 MG tablet Commonly known as: FLEXERIL Stopped by: Shelda Pal,  DO   DULoxetine 30 MG capsule Commonly known as: CYMBALTA Stopped by: Shelda Pal, DO   DULoxetine 60 MG capsule Commonly known as: CYMBALTA Stopped by: Shelda Pal, DO   meloxicam 15 MG tablet Commonly known as: MOBIC Stopped by: Shelda Pal, DO   oxycodone 5 MG capsule Commonly known as: OXY-IR Stopped by: Shelda Pal, DO   terbinafine 250 MG tablet Commonly known as: LAMISIL Stopped by: Shelda Pal, DO     TAKE these medications   ALPRAZolam 0.5 MG tablet Commonly known as: XANAX Take 1 tablet (0.5 mg total) by mouth daily as needed for anxiety.   atorvastatin 10 MG tablet Commonly known as: LIPITOR Take 1 tablet (10 mg total) by mouth daily.   b complex vitamins capsule Take 1 capsule by mouth daily.   CALCIUM 1000 + D PO Take 1 tablet by mouth daily.   Calcium Carbonate-Vitamin D 600-400 MG-UNIT tablet Take by mouth.   FISH OIL PO Take 1 tablet by mouth daily.   Fluzone High-Dose Quadrivalent 0.7 ML Susy Generic drug: Influenza Vac High-Dose Quad   gabapentin 300 MG capsule Commonly known as: NEURONTIN Take 2 capsules (600 mg total) by mouth 2 (two) times daily.   levothyroxine 125 MCG tablet Commonly known as: SYNTHROID Take 1 tablet (125 mcg total) by mouth daily.   multivitamin tablet Take 1 tablet by mouth daily.   venlafaxine XR 150 MG  24 hr capsule Commonly known as: Effexor XR Take 1 capsule (150 mg total) by mouth daily with breakfast.   VITAMIN D3 PO Take 1 tablet by mouth daily.       Exam BP 123/71   Pulse 71   Temp 98.4 F (36.9 C)   Resp 20   Ht 5\' 6"  (1.676 m)   Wt 192 lb (87.1 kg)   SpO2 95%   BMI 30.99 kg/m  General:  well developed, well nourished, in no apparent distress Lungs:  No respiratory distress Psych: well oriented with normal range of affect and age-appropriate judgement/insight, alert and oriented x4.  Assessment and Plan  Anxiety - Plan:  ALPRAZolam (XANAX) 0.5 MG tablet  Hyperlipidemia, unspecified hyperlipidemia type - Plan: atorvastatin (LIPITOR) 10 MG tablet, Comprehensive metabolic panel, Lipid panel  Hypothyroidism, unspecified type - Plan: levothyroxine (SYNTHROID) 125 MCG tablet, TSH, T4, free  Lateral epicondylitis of right elbow  F/u in 6 mo for CPE or prn. The patient voiced understanding and agreement to the plan.  [place], DO 08/07/20 8:13 AM

## 2020-08-07 NOTE — Patient Instructions (Signed)
Give [place] 2-3 business days to get the results of your labs back.   Keep the diet clean and stay active.  Ice/cold pack over area for 10-15 min twice daily.  Consider a forearm strap (Band-IT) to help with your elbow. This can give your elbow a break and allow it to heal faster.  Let us know if you need anything.   Elbow and Forearm Exercises It is normal to feel mild stretching, pulling, tightness, or discomfort as you do these exercises, but you should stop right away if you feel sudden pain or your pain gets worse. RANGE OF MOTION EXERCISES These exercises warm up your muscles and joints and improve the movement and flexibility of your injured elbow and forearm. These exercises also help to relieve pain, numbness, and tingling.These exercises are done using the muscles in your injured elbow and forearm. Exercise A: Elbow Flexion, Active 1. Hold your left / right arm at your side, and bend your elbow as far as you can using your left / right arm muscles. 2. Hold this position for 30 seconds. 3. Slowly return to the starting position. Repeat 2 times. Complete this exercise 3 times per week. Exercise B: Elbow Extension, Active 1. Hold your left / right arm at your side, and straighten your elbow as much as you can using your left / right arm muscles. 2. Hold this position for 30 seconds. 3. Slowly return to the starting position. Repeat 2 times. Complete this exercise 3 times per week. Exercise C: Forearm Rotation, Supination, Active 1. Stand or sit with your elbows at your sides. 2. Bend your left / right elbow to an "L" shape (90 degrees). 3. Turn your palm upward until you feel a gentle stretch on the inside of your forearm. 4. Hold this position for 30 seconds. 5. Slowly release and return to the starting position. Repeat 2 times. Complete this exercise 3 times per week. Exercise D: Forearm Rotation, Pronation, Active 1. Stand or sit with your elbows at your side. 2. Bend your  left / right elbow to an "L" shape (90 degrees). 3. Turn your left / right palm downward until you feel a gentle stretch on the top of your forearm. 4. Hold this position for 30 seconds. 5. Slowly release and return to the starting position. Repeat2 times. Complete this exercise 3 times per week. STRETCHING EXERCISES These exercises warm up your muscles and joints and improve the movement and flexibility of your injured elbow and forearm. These exercises also help to relieve pain, numbness, and tingling.These exercises are done using your healthy elbow and forearm to help stretch the muscles in your injured elbow and forearm. Exercise E: Elbow Flexion, Active-Assisted  1. Hold your left / right arm at your side, and bend your elbow as much as you can using your left / right arm muscles. 2. Use your other hand to bend your left / right elbow farther. To do this, gently push up on your forearm until you feel a gentle stretch on the back of your elbow. 3. Hold this position for 30 seconds. 4. Slowly return to the starting position. Repeat 2 times. Complete this exercise 3 times per week. Exercise F: Elbow Extension, Active-Assisted  1. Hold your left / right arm at your side, and straighten your elbow as much as you can using your left / right arm muscles. 2. Use your other hand to straighten the left / right elbow farther. To do this, gently push down on your  forearm until you feel a gentle stretch on the inside of your elbow. 3. Hold this position for 30 seconds. 4. Slowly return to the starting position. Repeat 2 times. Complete this exercise 3 times per weeky. Exercise G: Forearm Rotation, Supination, Active-Assisted  1. Sit with your left / right elbow bent in an "L" shape (90 degrees) with your forearm resting on a table. 2. Keeping your upper body and shoulder still, rotate your forearm so your left / right palm faces upward. 3. Use your other hand to help rotate your forearm further  until you feel a gentle to moderate stretch. 4. Hold this position for 30 seconds. 5. Slowly release the stretch and return to the starting position. Repeat 2 times. Complete this exercise 3 times per week. Exercise H: Forearm Rotation, Pronation, Active-Assisted  1. Sit with your left / right elbow bent in an "L" shape (90 degrees) with your forearm resting on a table. 2. Keeping your upper body and shoulder still, rotate your forearm so your palm faces the tabletop. 3. Use your other hand to help rotate your forearm further until you feel a gentle to moderate stretch. 4. Hold this position for 30 seconds. 5. Slowly release the stretch and return to the starting position. Repeat 2 times. Complete this exercise 3 times per week. Exercise I: Elbow Flexion, Supine, Passive 1. Lie on your back. 2. Extend your left / right arm up in the air, bracing it with your other hand. 3. Let your left / right your hand slowly lower toward your shoulder, while your elbow stays pointed toward the ceiling. You should feel a gentle stretch along the back of your upper arm and elbow. 4. If instructed by your health care provider, you may increase the intensity of your stretch by adding a small wrist weight or hand weight. 5. Hold this position for 3 seconds. 6. Slowly return to the starting position. Repeat 2 times. Complete this exercise 3 times per week. Exercise J: Elbow Extension, Supine, Passive  1. Lie on your back. Make sure that you are in a comfortable position that lets you relax your arm muscles. 2. Place a folded towel under your left / right upper arm so your elbow and shoulder are at the same height. Straighten your left / right arm so your elbow does not rest on the bed or towel. 3. Let the weight of your hand stretch your elbow. Keep your arm and chest muscles relaxed. You should feel a stretch on the inside of your elbow. 4. If told by your health care provider, you may increase the intensity  of your stretch by adding a small wrist weight or hand weight. 5. Hold this position for 30 seconds. 6. Slowly release the stretch. Repeat 2 times. Complete this exercise 3 times per week. STRENGTHENING EXERCISES These exercises build strength and endurance in your elbow and forearm. Endurance is the ability to use your muscles for a long time, even after they get tired. Exercise K: Elbow Flexion, Isometric  1. Stand or sit up straight. 2. Bend your left / right elbow in an "L" shape (90 degrees) and turn your palm up so your forearm is at the height of your waist. 3. Place your other hand on top of your forearm. Gently push down as your left / right arm resists. Push as hard as you can with both arms without causing any pain or movement at your left / right elbow. 4. Hold this position for 3 seconds.  5. Slowly release the tension in both arms. Let your muscles relax completely before repeating. Repeat 2 times. Complete this exercise 3 times per week. Exercise L: Elbow Extensors, Isometric  1. Stand or sit up straight. 2. Place your left / right arm so your palm faces your abdomen and it is at the height of your waist. 3. Place your other hand on the underside of your forearm. Gently push up as your left / right arm resists. Push as hard as you can with both arms, without causing any pain or movement at your left / right elbow. 4. Hold this position for 3 seconds. 5. Slowly release the tension in both arms. Let your muscles relax completely before repeating. Repeat _______2___ times. Complete this exercise 3 times per week. Exercise M: Elbow Flexion With Forearm Palm Up  1. Sit upright on a firm chair without armrests, or stand. 2. Place your left / right arm at your side with your palm facing forward. 3. Holding a 5 lbweight or gripping a rubber exercise band or tubing, bend your elbow to bring your hand toward your shoulder. 4. Hold this position for 3 seconds. 5. Slowly return to the  starting position. Repeat 2 times. Complete this exercise 3 times per week. Exercise N: Elbow Extension  1. Sit on a firm chair without armrests, or stand. 2. Keeping your upper arms at your sides, bring both hands up toward your left / right shoulder while you grip a rubber exercise band or tubing. Your left / right hand should be just below the other hand. 3. Straighten your left / right elbow. 4. Hold this position for 3 seconds. 5. Control the resistance of the band or tubing as your hand returns to your side. Repeat 2 times. Complete this exercise 3 times per week. Exercise O: Forearm Rotation, Supination  1. Sit with your left / right forearm supported on a table. Keep your elbow at waist height. 2. Rest your hand over the edge of the table with your palm facing down. 3. Gently hold a lightweight hammer. 4. Without moving your elbow, slowly rotate your forearm to turn your palm and hand upward to a "thumbs-up" position. 5. Hold this position for 3 seconds. 6. Slowly return to the starting position. Repeat 2 times. Complete this exercise 3 times per week. Exercise P: Forearm Rotation, Pronation  1. Sit with your left / right forearm supported on a table. Keep your elbow below shoulder height. 2. Rest your hand over the edge of the table with your palm facing up. 3. Gently hold a lightweight hammer. 4. Without moving your elbow, slowly rotate your forearm to turn your palm and hand upward to a "thumbs-up" position. 5. Hold this position for 3 seconds. 6. Slowly return to the starting position. Repeat 2 times. Complete this exercise 3 times per week.  Make sure you discuss any questions you have with your health care provider. Document Released: 01/21/2005 Document Revised: 07/18/2015 Document Reviewed: 12/02/2014 Elsevier Interactive Patient Education  [employer].

## 2020-08-16 ENCOUNTER — Other Ambulatory Visit: Payer: Self-pay

## 2020-08-16 ENCOUNTER — Ambulatory Visit: Payer: Medicare Other | Admitting: Podiatry

## 2020-08-16 ENCOUNTER — Ambulatory Visit (INDEPENDENT_AMBULATORY_CARE_PROVIDER_SITE_OTHER): Payer: Medicare Other

## 2020-08-16 ENCOUNTER — Encounter: Payer: Self-pay | Admitting: Podiatry

## 2020-08-16 DIAGNOSIS — M778 Other enthesopathies, not elsewhere classified: Secondary | ICD-10-CM

## 2020-08-16 DIAGNOSIS — M25872 Other specified joint disorders, left ankle and foot: Secondary | ICD-10-CM

## 2020-08-16 DIAGNOSIS — M205X2 Other deformities of toe(s) (acquired), left foot: Secondary | ICD-10-CM

## 2020-08-16 DIAGNOSIS — M79672 Pain in left foot: Secondary | ICD-10-CM | POA: Diagnosis not present

## 2020-08-16 DIAGNOSIS — M19072 Primary osteoarthritis, left ankle and foot: Secondary | ICD-10-CM | POA: Diagnosis not present

## 2020-08-16 DIAGNOSIS — M7732 Calcaneal spur, left foot: Secondary | ICD-10-CM | POA: Diagnosis not present

## 2020-08-16 DIAGNOSIS — Z9889 Other specified postprocedural states: Secondary | ICD-10-CM | POA: Diagnosis not present

## 2020-08-16 DIAGNOSIS — M7989 Other specified soft tissue disorders: Secondary | ICD-10-CM | POA: Diagnosis not present

## 2020-08-16 DIAGNOSIS — G9050 Complex regional pain syndrome I, unspecified: Secondary | ICD-10-CM

## 2020-08-16 IMAGING — DX DG FOOT COMPLETE 3+V*L*
3 series · 3 of 3 positions shown · non-contrast
Comparison: [DATE], [DATE], [DATE]

CLINICAL DATA: Left foot pain

EXAM:
LEFT FOOT - COMPLETE 3+ VIEW

[foot ap]
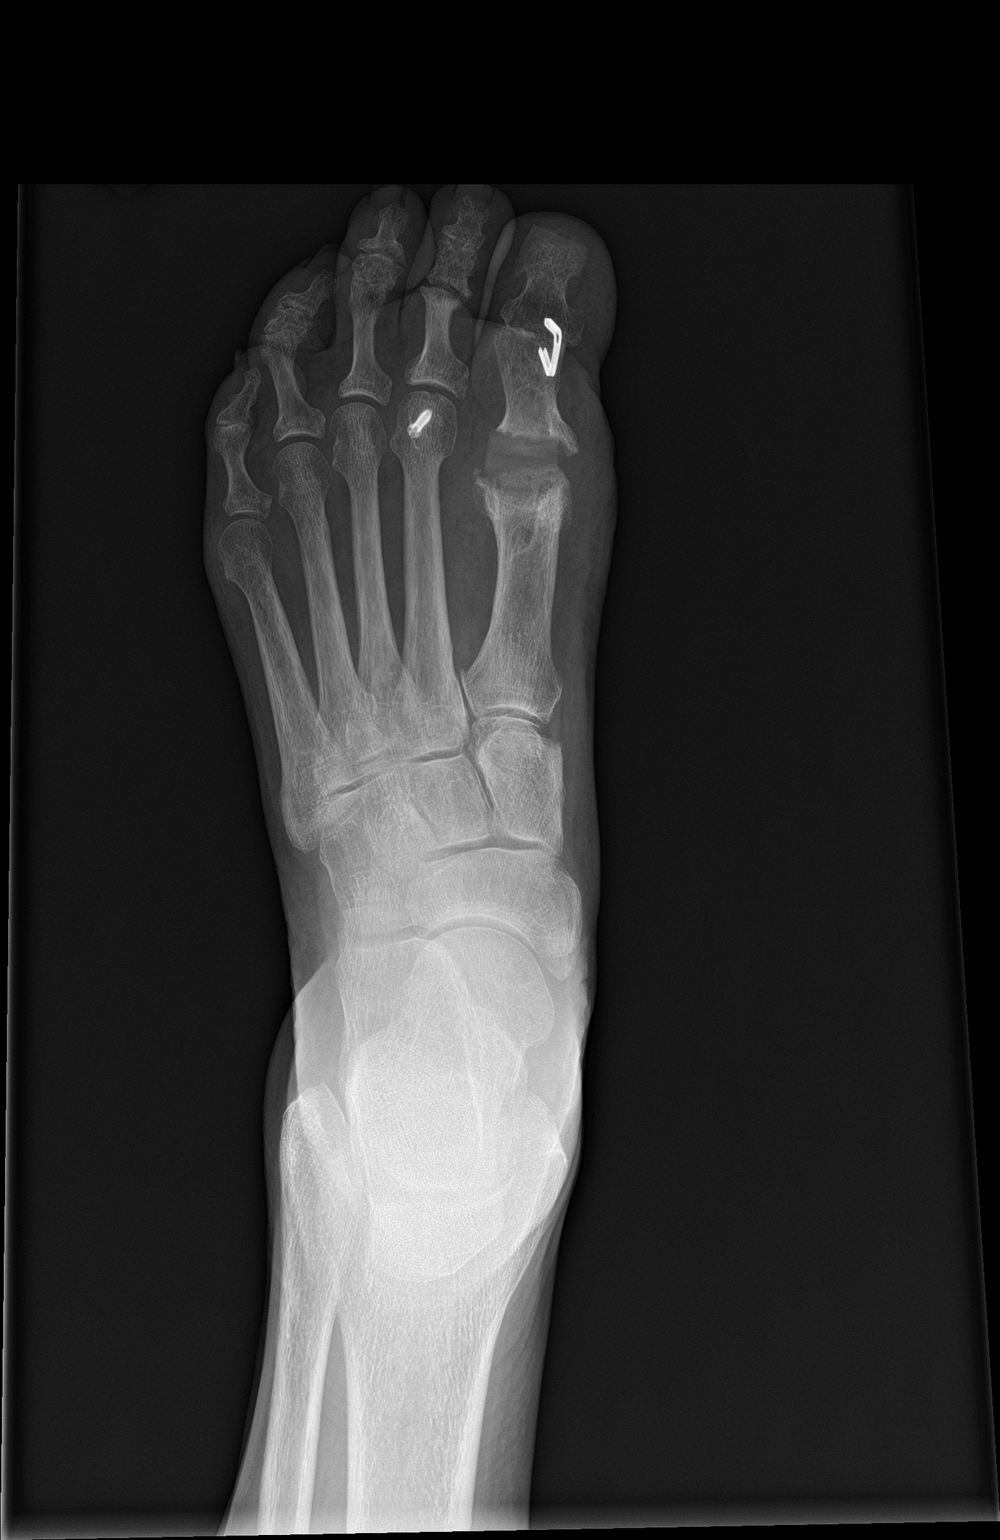

[foot obl]
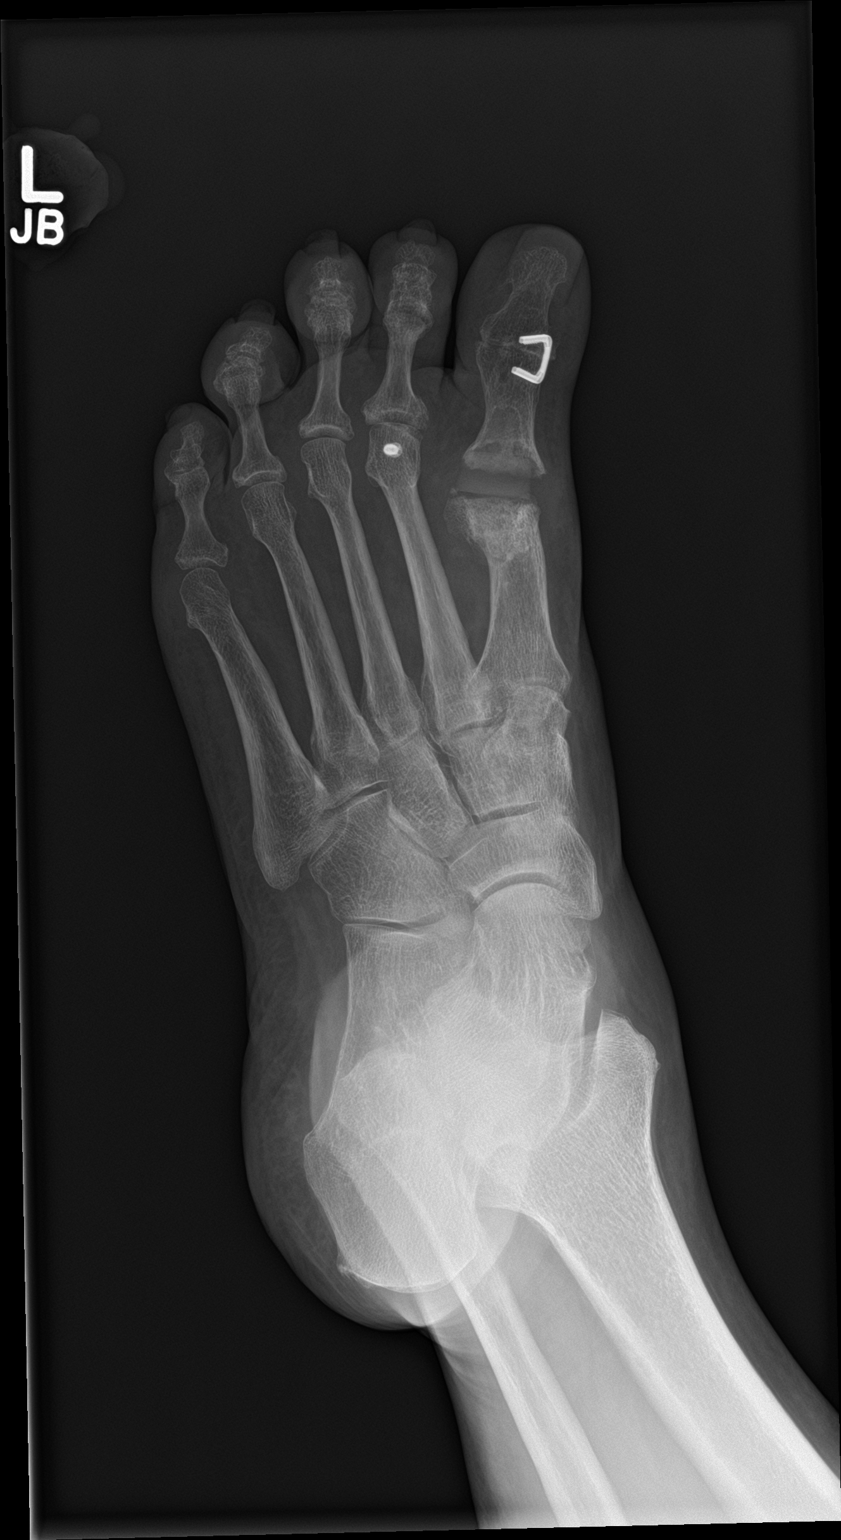

[foot lat]
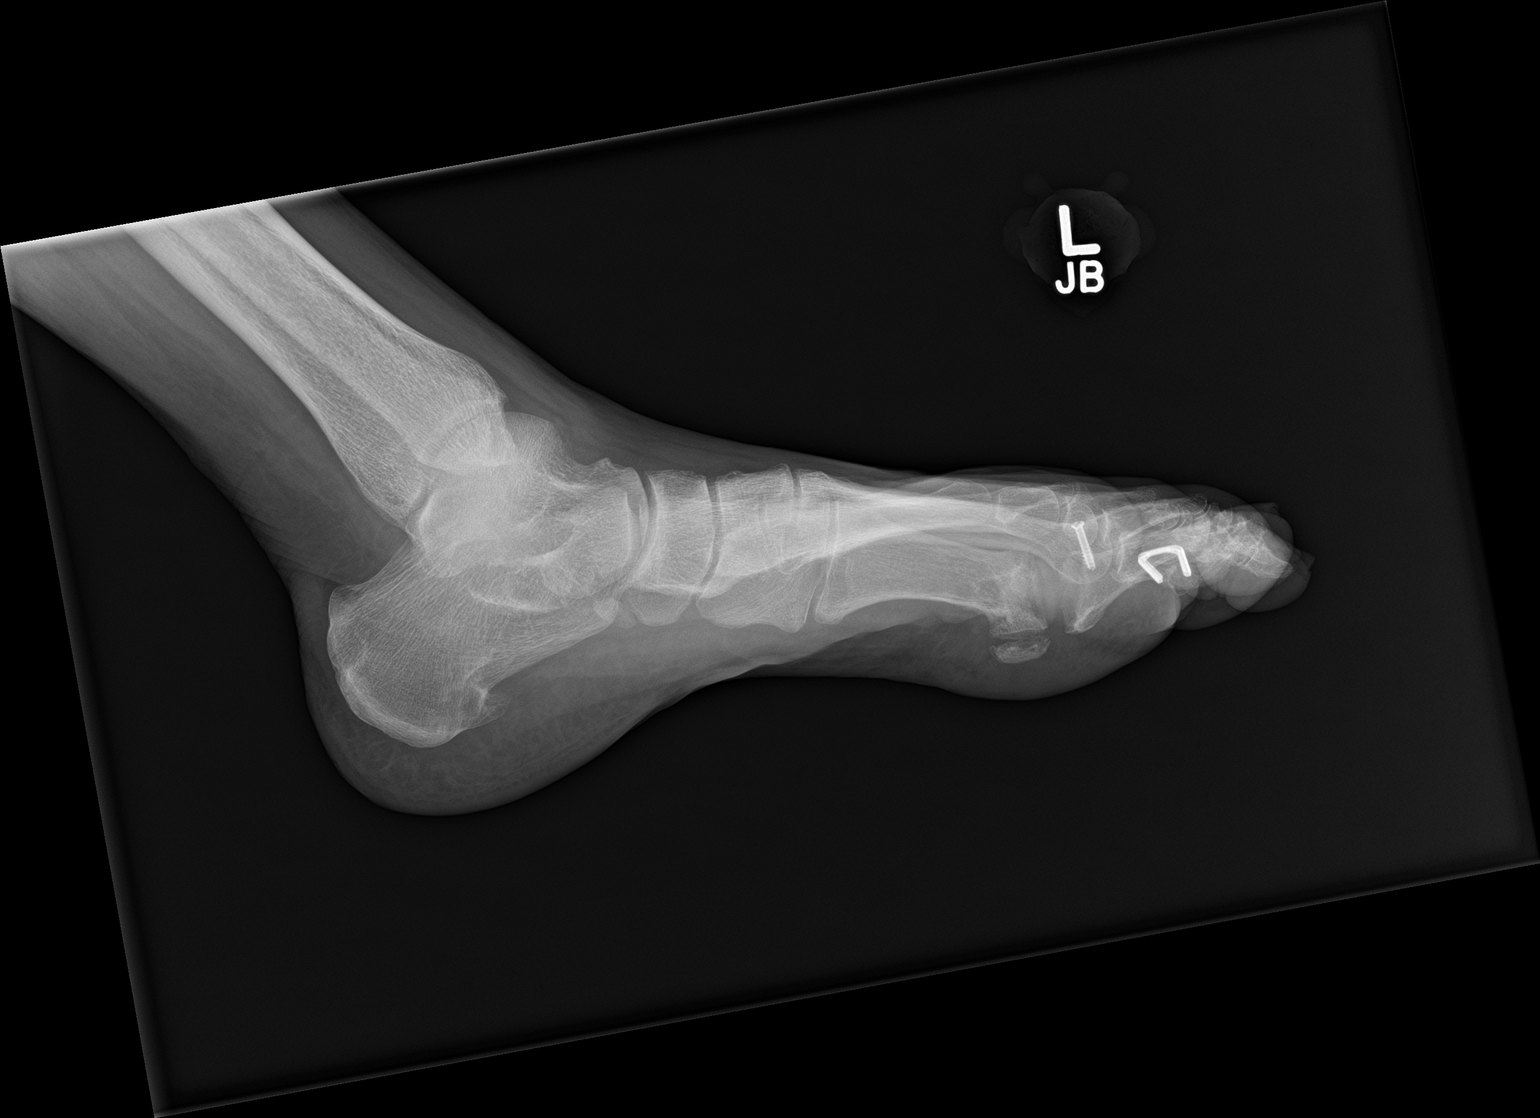

[3 of 3 positions shown; findings below may reference images not displayed]

FINDINGS: Mild degenerative changes at the first TMT joint. Fixating screw
within the head of the second metatarsal. Hardware across the first
IP joint with interim removal of external fixation pin. Previous
arthroplasty at the first MTP joint with stable lucencies at the
head and distal shaft of the first metatarsal and the base and
proximal shaft of the proximal phalanx. Residual soft tissue
swelling. No fracture is seen. Irregularity and mild widening at the
second PIP joint with mild sclerosis of the proximal phalanx is
chronic. Small plantar calcaneal spur
IMPRESSION: 1. Postsurgical changes of the first and second digits as described
above with interim removal of external fixation pin from the first
digit. Residual soft tissue swelling at the level of the first MTP
joint.
2. Mild degenerative changes at the first TMT joint.

## 2020-08-16 MED ORDER — NITROGLYCERIN 0.2 MG/HR TD PT24: 0.2000 mg | MEDICATED_PATCH | Freq: Every day | TRANSDERMAL | 0 refills | Status: DC

## 2020-08-16 MED ORDER — TRIAMCINOLONE ACETONIDE 10 MG/ML IJ SUSP
10.0000 mg | Freq: Once | INTRAMUSCULAR | Status: AC
Start: 2020-08-16 — End: 2020-08-16
  Administered 2020-08-16: 10 mg

## 2020-08-16 MED ORDER — METHYLPREDNISOLONE 4 MG PO TBPK: ORAL_TABLET | ORAL | 0 refills | Status: DC

## 2020-08-16 MED ORDER — IBUPROFEN 800 MG PO TABS: 800.0000 mg | ORAL_TABLET | Freq: Three times a day (TID) | ORAL | 0 refills | Status: DC | PRN

## 2020-08-17 NOTE — Progress Notes (Signed)
Subjective: Elaine Buck is a 65 y.o. is seen today in office s/p left foot replacement of 1st MTPJ implant, IPJ fusion and sesamoidectomy preformed on 05/15/2020.  She is back to wearing a regular shoe.  She has been doing home exercises that she did previously physical therapy.  She states that with and without shoes her feet feel that they are "crushed" all the time.  Also points more proximal on her foot where she gets discomfort.  She does get numbness and tingling and burning to her feet.  She is on gabapentin 1200 mg total for the day.  Denies any recent injury or trauma. Denies any systemic complaints such as fevers, chills, nausea, vomiting. No calf pain, chest pain, shortness of breath.   Objective: General: No acute distress, AAOx3  DP/PT pulses palpable 2/4, CRT < 3 sec to all digits.  Protective sensation intact. Motor function intact.  LEFT foot: Incision is well coapted without any evidence of dehiscence and a scar is formed.  There is minimal edema to the foot.  No significant discomfort along the area of the surgical site along the first MPJ, sesamoids or along the hallux.  The majority discomfort today is on the first metatarsal cuneiform joint and dorsal midfoot.  There is mild dorsal spurring present of the first metatarsal cuneiform joint area.  No significant discomfort to the lesser digits.  Mild discomfort submetatarsal to the lesser digits.  There is no area of pinpoint tenderness.  Upon palpation of the dorsal medial midfoot she states the area feels numb.  This is been ongoing since I first met her. No other open lesions or pre-ulcerative lesions.  No pain with calf compression, swelling, warmth, erythema.   Assessment and Plan:  Status post left foot surgery, neuropathy-concern for CRPS   -Treatment options discussed including all alternatives, risks, and complications -X-rays ordered -Steroid injection performed to the dorsal medial aspect of the midfoot on the area of  tenderness which is away from the surgical site.  Skin was prepped with alcohol and a mixture of 1 cc Kenalog 10, 0.5 cc of Marcaine plain, 0.5 cc of lidocaine plain was infiltrated into the area of maximal tenderness without complications.  Postinjection care was discussed. -She was measured for new inserts today.  We will try to calm her accommodative offloading insert. -Prescribed Nitropatch to apply to the foot. -Discussed increasing gabapentin to 600 mg 3 times a day. -We will do Medrol Dosepak and once discussed complete she can start ibuprofen.  Discussed her not to take these together. -Consider referral to pain management   [name]    -Today we had a long discussion regards to surgery as well as neuropathy.  For now I want to continue the current medications for neuropathy without change and then to see if it can be beneficial for her.  I want her to continue to try to transition to regular shoe.  When taken off the orthotic that she really had made previously where to just do a regular insert with a metatarsal pad.  Gave her various metatarsal pads for offloading.  Discussed with her hold off on exercise until she is back into a tennis shoe full-time before she gradually increases this. -It is unusual that her neuropathy is asymmetrical.  I discussed different etiologies of nerve issues including CRPS today.  Due to concerns for other nerve issues I do want to continue with gabapentin, therapy and trying to stay as active as possible.  Once  we get the foot to improve we discussed possibly coming off of these medications to see if there is any difference.  [name]

## 2020-09-04 ENCOUNTER — Telehealth: Payer: Self-pay | Admitting: Podiatry

## 2020-09-04 NOTE — Telephone Encounter (Signed)
Re ordered orthotics in... lvm for pt to call to schedule an appt to pick them up.

## 2020-09-13 ENCOUNTER — Telehealth: Payer: Self-pay | Admitting: *Deleted

## 2020-09-13 NOTE — Telephone Encounter (Signed)
Called and left a message for the patient to see how she was doing and to call me at patient's convenience. Elaine Buck

## 2020-09-24 ENCOUNTER — Other Ambulatory Visit: Payer: Self-pay | Admitting: Family Medicine

## 2020-09-24 MED ORDER — TIZANIDINE HCL 4 MG PO TABS: 8.0000 mg | ORAL_TABLET | Freq: Every evening | ORAL | 1 refills | Status: DC | PRN

## 2020-09-26 ENCOUNTER — Ambulatory Visit (INDEPENDENT_AMBULATORY_CARE_PROVIDER_SITE_OTHER): Payer: Medicare Other | Admitting: Obstetrics & Gynecology

## 2020-09-26 ENCOUNTER — Other Ambulatory Visit: Payer: Self-pay

## 2020-09-26 ENCOUNTER — Encounter: Payer: Self-pay | Admitting: Obstetrics & Gynecology

## 2020-09-26 VITALS — BP 135/67 | HR 90 | Ht 66.0 in | Wt 192.0 lb

## 2020-09-26 DIAGNOSIS — Z01419 Encounter for gynecological examination (general) (routine) without abnormal findings: Secondary | ICD-10-CM | POA: Diagnosis not present

## 2020-09-26 DIAGNOSIS — Z1231 Encounter for screening mammogram for malignant neoplasm of breast: Secondary | ICD-10-CM

## 2020-09-26 NOTE — Progress Notes (Signed)
GYNECOLOGY ANNUAL PREVENTATIVE CARE ENCOUNTER NOTE  History:     Elaine Buck is a 66 y.o. G39P2012 female here for a routine annual gynecologic exam.  Current complaints: none.   Denies abnormal vaginal bleeding, discharge, pelvic pain, problems with intercourse or other gynecologic concerns.    Gynecologic History No LMP recorded. Patient is postmenopausal. Last Pap: 09/19/2019. Results were: normal with negative HPV Last mammogram: 08/28/2019. Results were: normal  Obstetric History OB History  Gravida Para Term Preterm AB Living  3 2 2   1 2   SAB IAB Ectopic Multiple Live Births  1       2    # Outcome Date GA Lbr Len/2nd Weight Sex Delivery Anes PTL Lv  3 SAB           2 Term      Vag-Spont     1 Term      Vag-Spont       Past Medical History:  Diagnosis Date   Anxiety    Arthritis    Cancer ([place])    breast (Left)   History of colon polyps    History of lobular carcinoma in situ (LCIS) of  left breast    s/p lumpectomy in 2005, Tamoxifen/Evista therapy for 5 years   Hyperlipidemia    Neuromuscular disorder ([place])    small fiber neuropathy, L foot    Thyroid disease     Past Surgical History:  Procedure Laterality Date   BREAST BIOPSY     BREAST LUMPECTOMY Left 05/22/2003   LCIS Cells found in breast tissue   FOOT SURGERY Left 04/2019   joint inplantment in big toe    TUBAL LIGATION      Current Outpatient Medications on File Prior to Visit  Medication Sig Dispense Refill   ALPRAZolam (XANAX) 0.5 MG tablet Take 1 tablet (0.5 mg total) by mouth daily as needed for anxiety. 30 tablet 5   atorvastatin (LIPITOR) 10 MG tablet Take 1 tablet (10 mg total) by mouth daily. 90 tablet 3   b complex vitamins capsule Take 1 capsule by mouth daily.     Calcium Carb-Cholecalciferol (CALCIUM 1000 + D PO) Take 1 tablet by mouth daily.     Calcium Carbonate-Vitamin D 600-400 MG-UNIT tablet Take by mouth.     Cholecalciferol (VITAMIN D3 PO) Take 1 tablet by mouth daily.      gabapentin (NEURONTIN) 300 MG capsule Take 2 capsules (600 mg total) by mouth 2 (two) times daily. 360 capsule 3   ibuprofen (ADVIL) 800 MG tablet Take 1 tablet (800 mg total) by mouth every 8 (eight) hours as needed. 30 tablet 0   levothyroxine (SYNTHROID) 125 MCG tablet Take 1 tablet (125 mcg total) by mouth daily. 90 tablet 3   magnesium gluconate (MAGONATE) 500 MG tablet Take 500 mg by mouth 2 (two) times daily.     Multiple Vitamin (MULTIVITAMIN) tablet Take 1 tablet by mouth daily.     nitroGLYCERIN (NITRO-DUR) 0.2 mg/hr patch Place 1 patch (0.2 mg total) onto the skin daily. 30 patch 0   Omega-3 Fatty Acids (FISH OIL PO) Take 1 tablet by mouth daily.     tiZANidine (ZANAFLEX) 4 MG tablet Take 2 tablets (8 mg total) by mouth at bedtime as needed for muscle spasms. 60 tablet 1   venlafaxine XR (EFFEXOR XR) 150 MG 24 hr capsule Take 1 capsule (150 mg total) by mouth daily with breakfast. 90 capsule 3   FLUZONE HIGH-DOSE QUADRIVALENT 0.7  ML SUSY      No current facility-administered medications on file prior to visit.    Allergies  Allergen Reactions   Other Hives and Itching    Peaches   Prunus Persica     Social History:  reports that she has been smoking cigarettes. She has a 16.00 pack-year smoking history. She has never used smokeless tobacco. She reports current alcohol use. She reports that she does not use drugs.  Family History  Problem Relation Age of Onset   Stroke Mother    Bipolar disorder Son    ADD / ADHD Son    Anxiety disorder Son    Colon cancer Neg Hx    Esophageal cancer Neg Hx    Stomach cancer Neg Hx    Rectal cancer Neg Hx     The following portions of the patient's history were reviewed and updated as appropriate: allergies, current medications, past family history, past medical history, past social history, past surgical history and problem list.  Review of Systems Pertinent items noted in HPI and remainder of comprehensive ROS otherwise  negative.  Physical Exam:  BP 135/67   Pulse 90   Ht 5\' 6"  (1.676 m)   Wt 192 lb (87.1 kg)   BMI 30.99 kg/m  CONSTITUTIONAL: Well-developed, well-nourished female in no acute distress.  HENT:  Normocephalic, atraumatic, External right and left ear normal.  EYES: Conjunctivae and EOM are normal. Pupils are equal, round, and reactive to light. No scleral icterus.  NECK: Normal range of motion, supple, no masses.  Normal thyroid.  SKIN: Skin is warm and dry. No rash noted. Not diaphoretic. No erythema. No pallor. MUSCULOSKELETAL: Normal range of motion. No tenderness.  No cyanosis, clubbing, or edema. NEUROLOGIC: Alert and oriented to person, place, and time. Normal reflexes, muscle tone coordination.  PSYCHIATRIC: Normal mood and affect. Normal behavior. Normal judgment and thought content. CARDIOVASCULAR: Normal heart rate noted, regular rhythm RESPIRATORY: Clear to auscultation bilaterally. Effort and breath sounds normal, no problems with respiration noted. BREASTS: Symmetric in size. No masses, tenderness, skin changes, nipple drainage, or lymphadenopathy bilaterally. Performed in the presence of a chaperone. ABDOMEN: Soft, no distention noted.  No tenderness, rebound or guarding.    Assessment and Plan:     1. Breast cancer screening by mammogram Mammogram scheduled - MM 3D SCREEN BREAST BILATERAL; Future  2. Well woman exam No need for pap smears anymore unless she develops risk factors for HPV acquisition. Routine preventative health maintenance measures emphasized. Please refer to After Visit Summary for other counseling recommendations.      Verita Schneiders, MD, [place] for [employer], [place]

## 2020-09-26 NOTE — Patient Instructions (Signed)
Preventive Care 66 Years and Older, Female Preventive care refers to lifestyle choices and visits with your health care provider that can promote health and wellness. This includes: A yearly physical exam. This is also called an annual wellness visit. Regular dental and eye exams. Immunizations. Screening for certain conditions. Healthy lifestyle choices, such as: Eating a healthy diet. Getting regular exercise. Not using drugs or products that contain nicotine and tobacco. Limiting alcohol use. What can I expect for my preventive care visit? Physical exam Your health care provider will check your: Height and weight. These may be used to calculate your BMI (body mass index). BMI is a measurement that tells if you are at a healthy weight. Heart rate and blood pressure. Body temperature. Skin for abnormal spots. Counseling Your health care provider may ask you questions about your: Past medical problems. Family's medical history. Alcohol, tobacco, and drug use. Emotional well-being. Home life and relationship well-being. Sexual activity. Diet, exercise, and sleep habits. History of falls. Memory and ability to understand (cognition). Work and work Statistician. Pregnancy and menstrual history. Access to firearms. What immunizations do I need?  Vaccines are usually given at various ages, according to a schedule. Your health care provider will recommend vaccines for you based on your age, medicalhistory, and lifestyle or other factors, such as travel or where you work. What tests do I need? Blood tests Lipid and cholesterol levels. These may be checked every 5 years, or more often depending on your overall health. Hepatitis C test. Hepatitis B test. Screening Lung cancer screening. You may have this screening every year starting at age 44 if you have a 30-pack-year history of smoking and currently smoke or have quit within the past 15 years. Colorectal cancer screening. All  adults should have this screening starting at age 39 and continuing until age 65. Your health care provider may recommend screening at age 61 if you are at increased risk. You will have tests every 1-10 years, depending on your results and the type of screening test. Diabetes screening. This is done by checking your blood sugar (glucose) after you have not eaten for a while (fasting). You may have this done every 1-3 years. Mammogram. This may be done every 1-2 years. Talk with your health care provider about how often you should have regular mammograms. Abdominal aortic aneurysm (AAA) screening. You may need this if you are a current or former smoker. BRCA-related cancer screening. This may be done if you have a family history of breast, ovarian, tubal, or peritoneal cancers. Other tests STD (sexually transmitted disease) testing, if you are at risk. Bone density scan. This is done to screen for osteoporosis. You may have this done starting at age 54. Talk with your health care provider about your test results, treatment options,and if necessary, the need for more tests. Follow these instructions at home: Eating and drinking  Eat a diet that includes fresh fruits and vegetables, whole grains, lean protein, and low-fat dairy products. Limit your intake of foods with high amounts of sugar, saturated fats, and salt. Take vitamin and mineral supplements as recommended by your health care provider. Do not drink alcohol if your health care provider tells you not to drink. If you drink alcohol: Limit how much you have to 0-1 drink a day. Be aware of how much alcohol is in your drink. In the [place] one drink equals one 12 oz bottle of beer (355 mL), one 5 oz glass of wine (148 mL), or one 1  oz glass of hard liquor (44 mL).  Lifestyle Take daily care of your teeth and gums. Brush your teeth every morning and night with fluoride toothpaste. Floss one time each day. Stay active. Exercise for at  least 30 minutes 5 or more days each week. Do not use any products that contain nicotine or tobacco, such as cigarettes, e-cigarettes, and chewing tobacco. If you need help quitting, ask your health care provider. Do not use drugs. If you are sexually active, practice safe sex. Use a condom or other form of protection in order to prevent STIs (sexually transmitted infections). Talk with your health care provider about taking a low-dose aspirin or statin. Find healthy ways to cope with stress, such as: Meditation, yoga, or listening to music. Journaling. Talking to a trusted person. Spending time with friends and family. Safety Always wear your seat belt while driving or riding in a vehicle. Do not drive: If you have been drinking alcohol. Do not ride with someone who has been drinking. When you are tired or distracted. While texting. Wear a helmet and other protective equipment during sports activities. If you have firearms in your house, make sure you follow all gun safety procedures. What's next? Visit your health care provider once a year for an annual wellness visit. Ask your health care provider how often you should have your eyes and teeth checked. Stay up to date on all vaccines. This information is not intended to replace advice given to you by your health care provider. Make sure you discuss any questions you have with your healthcare provider. Document Revised: 02/28/2020 Document Reviewed: 03/03/2018 Elsevier Patient Education  2022 [employer].

## 2020-09-27 ENCOUNTER — Encounter: Payer: Self-pay | Admitting: Podiatry

## 2020-10-28 ENCOUNTER — Other Ambulatory Visit: Payer: Self-pay

## 2020-10-28 ENCOUNTER — Encounter (HOSPITAL_BASED_OUTPATIENT_CLINIC_OR_DEPARTMENT_OTHER): Payer: Self-pay

## 2020-10-28 ENCOUNTER — Ambulatory Visit (HOSPITAL_BASED_OUTPATIENT_CLINIC_OR_DEPARTMENT_OTHER)
Admission: RE | Admit: 2020-10-28 | Discharge: 2020-10-28 | Disposition: A | Discharge status: Home / Self Care | Payer: Medicare Other | Source: Ambulatory Visit | Attending: Obstetrics & Gynecology | Admitting: Obstetrics & Gynecology

## 2020-10-28 DIAGNOSIS — Z1231 Encounter for screening mammogram for malignant neoplasm of breast: Secondary | ICD-10-CM | POA: Insufficient documentation

## 2020-10-28 IMAGING — MG MM DIGITAL SCREENING BILAT W/ TOMO AND CAD
8 series · 8 of 24 positions shown · non-contrast
Comparison: Previous exam(s).

CLINICAL DATA: Screening.

EXAM:
DIGITAL SCREENING BILATERAL MAMMOGRAM WITH TOMOSYNTHESIS AND CAD
TECHNIQUE: Bilateral screening digital craniocaudal and mediolateral oblique
mammograms were obtained. Bilateral screening digital breast
tomosynthesis was performed. The images were evaluated with
computer-aided detection.

[R MLO synth-2D]
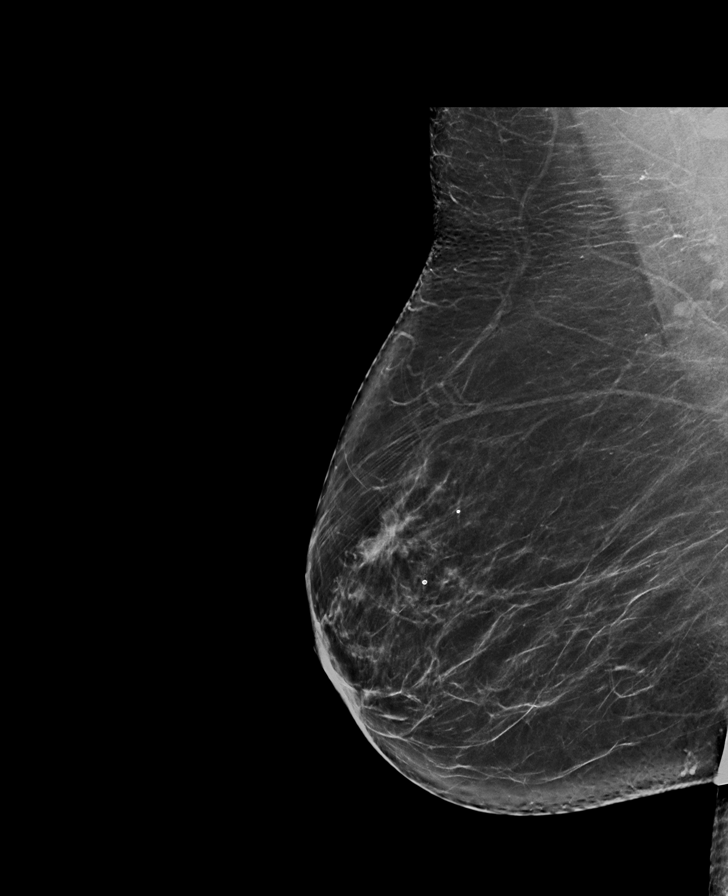

[L CC synth-2D]
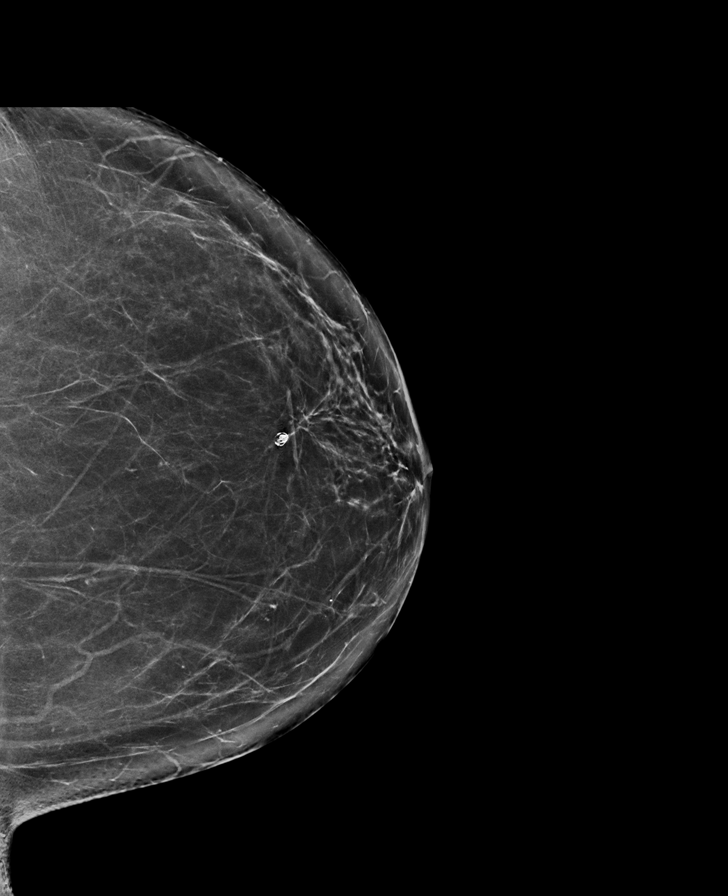

[R CC synth-2D]
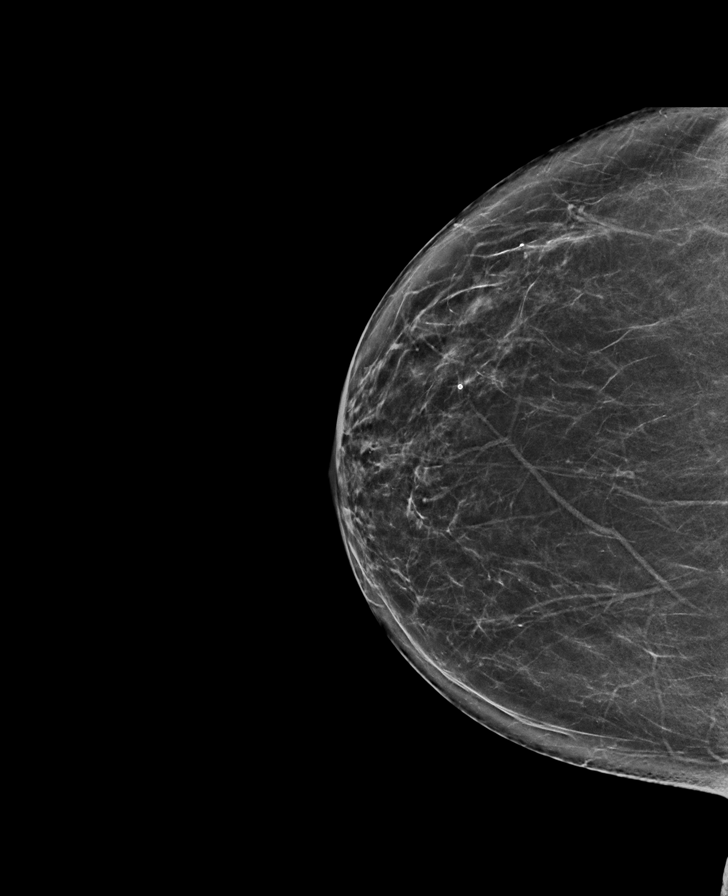

[L MLO synth-2D]
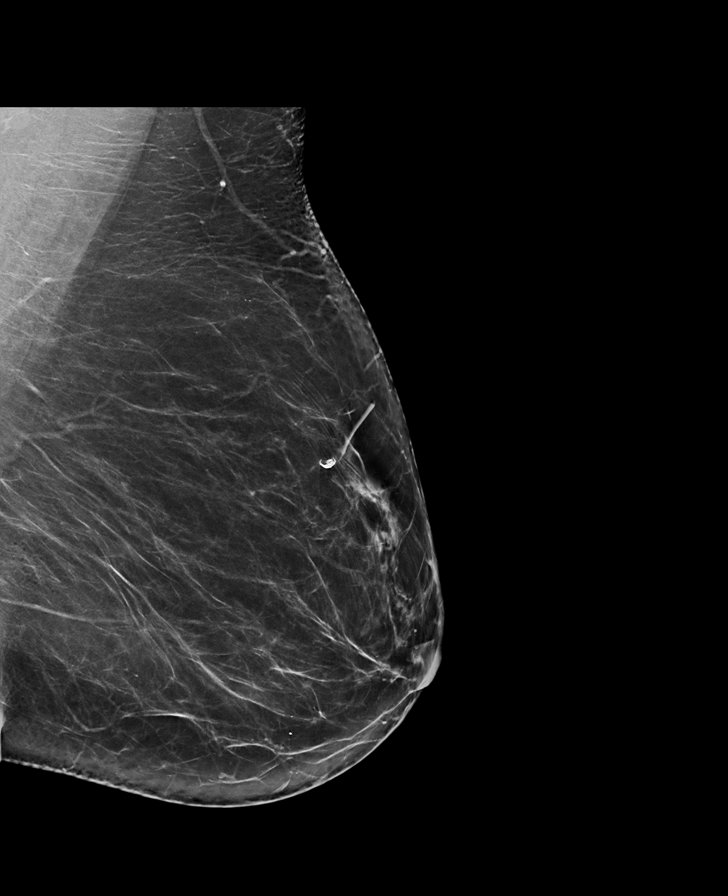

[R MLO tomo · tomo slice 42/83.0]
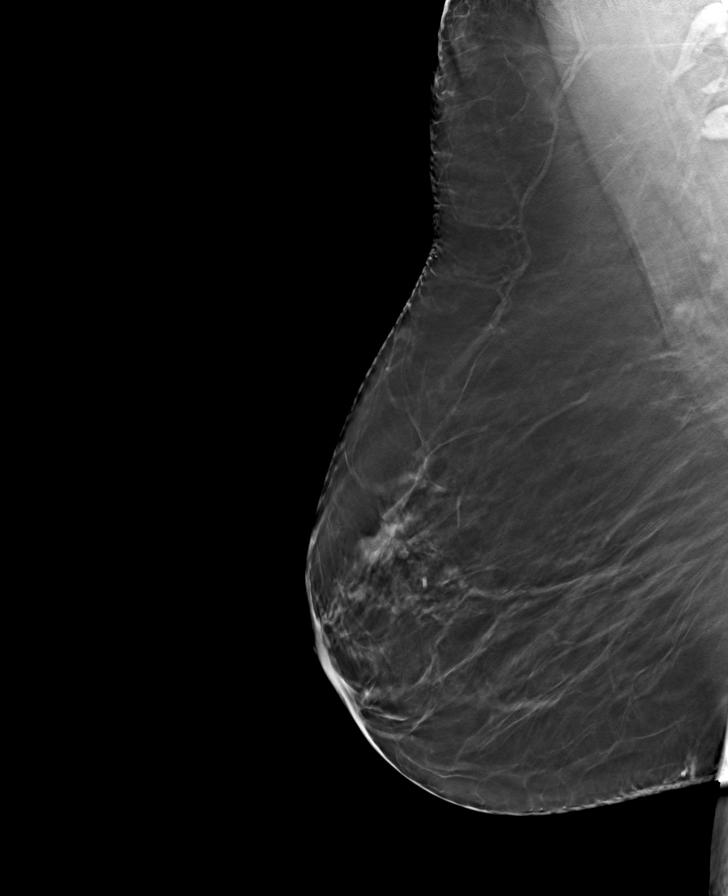

[R CC tomo · tomo slice 35/69.0]
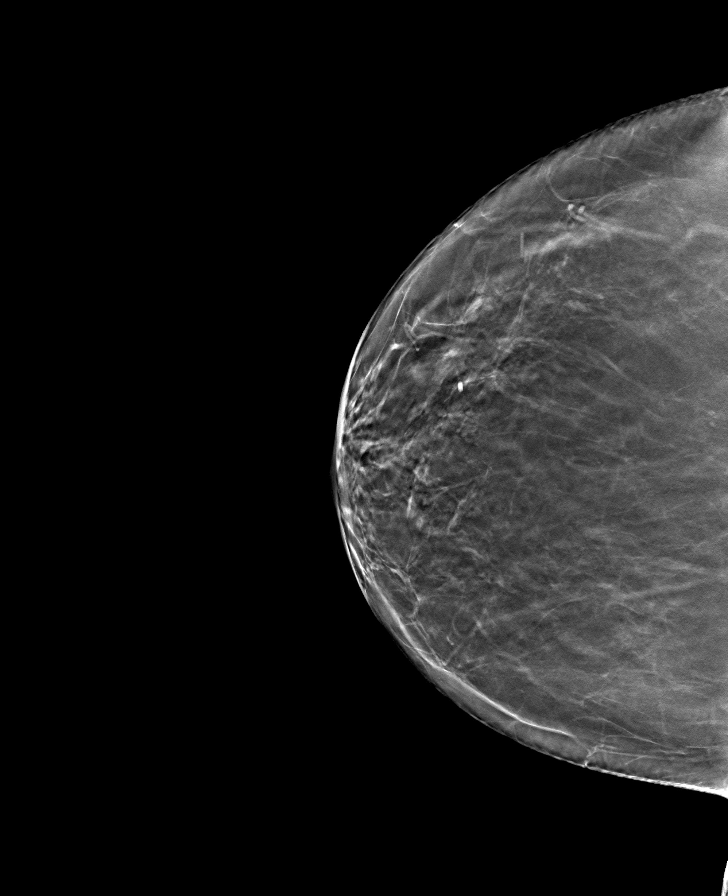

[L CC tomo · tomo slice 37/74.0]
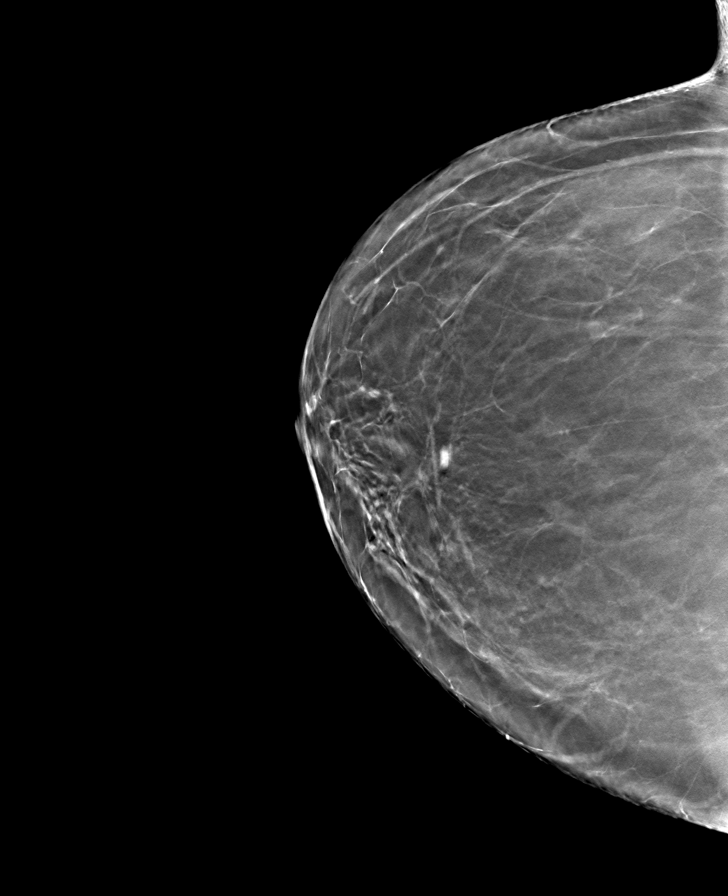

[L MLO tomo · tomo slice 43/85.0]
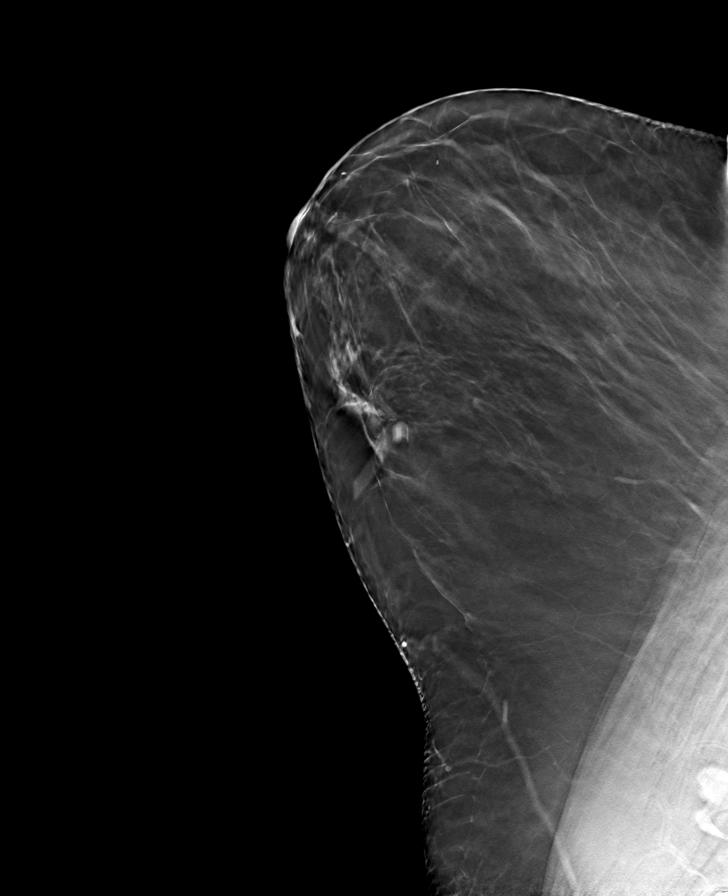

[8 of 24 positions shown; findings below may reference images not displayed]

ACR Breast Density Category b: There are scattered areas of
fibroglandular density.
FINDINGS: There are no findings suspicious for malignancy.
IMPRESSION: No mammographic evidence of malignancy. A result letter of this
screening mammogram will be mailed directly to the patient.

RECOMMENDATION:
Screening mammogram in one year. (Code:[BY])

BI-RADS CATEGORY  1: Negative.

## 2020-10-29 ENCOUNTER — Encounter: Payer: Self-pay | Admitting: Podiatry

## 2020-10-29 ENCOUNTER — Ambulatory Visit: Payer: Medicare Other | Admitting: Podiatry

## 2020-10-29 DIAGNOSIS — M205X2 Other deformities of toe(s) (acquired), left foot: Secondary | ICD-10-CM

## 2020-10-29 DIAGNOSIS — M25872 Other specified joint disorders, left ankle and foot: Secondary | ICD-10-CM

## 2020-10-29 DIAGNOSIS — G9050 Complex regional pain syndrome I, unspecified: Secondary | ICD-10-CM | POA: Diagnosis not present

## 2020-10-31 NOTE — Progress Notes (Addendum)
Subjective: Elaine Buck is a 65 y.o. is seen today in office s/p left foot replacement of 1st MTPJ implant, IPJ fusion and sesamoidectomy preformed on 05/15/2020.  She states that she still having pain to her foot.  She describes burning, tingling as well as a "tight" sensation to her foot.  She has been having burning tingling for some time and she is been on gabapentin previously and her symptomology.  She previoulsy had a nerve biopsy performed with left side more wrose than right side.  We discussed with her CRPS previously and she has been doing physical therapy, home exercises as well as gabapentin but she is continue to have symptoms.  Injection given last appointment was not helpful.  Also she has concerns about her left big toe turning outwards which causes discomfort with wearing shoes.  She feels that her toes feel "crushed".   Denies any systemic complaints such as fevers, chills, nausea, vomiting. No calf pain, chest pain, shortness of breath.   Objective: General: No acute distress, AAOx3  DP/PT pulses palpable 2/4, CRT < 3 sec to all digits.  Protective sensation intact. Motor function intact.  LEFT foot: Incision is well coapted without any evidence of dehiscence and a scar is formed.  Hallux varus is present on the left side.  There is no specific area of pinpoint tenderness.  She is starting get some discomfort however submetatarsal 1 There is no callus formation.  No pain with MPJ range of motion.  There is normal color bilaterally.  Slight decreased temperature on the left compared to the right today.  This is new. MMT 5/5 No other open lesions or pre-ulcerative lesions.  No pain with calf compression, swelling, warmth, erythema.   Assessment and Plan:  Left hallux varus, neuropathy-concern for CRPS   -Treatment options discussed including all alternatives, risks, and complications -Discussed with her combination biomechanical issues as well as still concern for CRPS.   She been on gabapentin she is back to see neurology.  Primary for [facility] management at [facility] to see if there is any intervention that can be performed for CRPS and also to make sure there is nothing else going on with the nerves. -From a mechanical standpoint I try to order her insulin to help hold the toe rectus so that way she can wear a regular closed in shoe.  [name]

## 2020-11-07 ENCOUNTER — Encounter: Payer: Self-pay | Admitting: Physical Medicine and Rehabilitation

## 2020-11-08 ENCOUNTER — Encounter: Payer: Self-pay | Admitting: Podiatry

## 2020-11-19 ENCOUNTER — Other Ambulatory Visit: Payer: Self-pay | Admitting: Family Medicine

## 2020-11-19 MED ORDER — TIZANIDINE HCL 4 MG PO TABS: 8.0000 mg | ORAL_TABLET | Freq: Every evening | ORAL | 5 refills | Status: DC | PRN

## 2020-11-23 ENCOUNTER — Encounter: Payer: Self-pay | Admitting: Podiatry

## 2020-11-29 ENCOUNTER — Encounter: Payer: Self-pay | Admitting: Podiatry

## 2020-11-29 ENCOUNTER — Ambulatory Visit: Payer: Medicare Other | Admitting: Podiatry

## 2020-12-09 ENCOUNTER — Encounter: Payer: Medicare Other | Admitting: Physical Medicine and Rehabilitation

## 2020-12-13 ENCOUNTER — Ambulatory Visit: Payer: Medicare Other | Admitting: Neurology

## 2020-12-24 ENCOUNTER — Encounter
Payer: Medicare Other | Attending: Physical Medicine and Rehabilitation | Admitting: Physical Medicine and Rehabilitation

## 2020-12-24 ENCOUNTER — Other Ambulatory Visit: Payer: Self-pay

## 2020-12-24 VITALS — BP 117/75 | HR 82 | Temp 98.1°F | Ht 66.0 in | Wt 201.2 lb

## 2020-12-24 DIAGNOSIS — G629 Polyneuropathy, unspecified: Secondary | ICD-10-CM | POA: Diagnosis not present

## 2020-12-24 DIAGNOSIS — G90522 Complex regional pain syndrome I of left lower limb: Secondary | ICD-10-CM | POA: Insufficient documentation

## 2020-12-24 DIAGNOSIS — R252 Cramp and spasm: Secondary | ICD-10-CM | POA: Insufficient documentation

## 2020-12-24 MED ORDER — AMITRIPTYLINE HCL 10 MG PO TABS: 10.0000 mg | ORAL_TABLET | Freq: Every day | ORAL | 1 refills | Status: DC

## 2020-12-24 NOTE — Progress Notes (Signed)
Subjective:    Patient ID: Elaine Buck, female    DOB: [birth year], 65 y.o.   MRN: [id number]  HPI Elaine Buck is a 65 year old woman who presents for bilateral foot pain.   - she has tightness in both feet,  -pain is 95% on the left foot, 5% in the right foot -she has had surgery on her left foot -she has had 4 surgeries in the same area- first was in 2020.  -she has tried Gabapentin and Nortriptyline- niether of which helped.  -Pain is worst at night- she has a hard time falling asleep -touching the sheets causes exquisite pain -lot of cramping in left leg. -her family doctor put her on muscle relaxers which does help.  -Leg cramps have worsened after surgery.   Pain Inventory Average Pain 7 Pain Right Now 7 My pain is constant, burning, aching, and tight feeling  In the last 24 hours, has pain interfered with the following? General activity 7 Relation with others 0 Enjoyment of life 4 What TIME of day is your pain at its worst? morning , daytime, evening, and night Sleep (in general) Fair  Pain is worse with: walking, standing, and leg raised Pain improves with:  nothing Relief from Meds: 0    "walk a mile"  retired  numbness trouble walking spasms  Any changes since last visit?  no  Dr Elaine Buck referral MD (podiarist)    Family History  Problem Relation Age of Onset   Stroke Elaine Buck    Bipolar disorder Elaine Buck    ADD / ADHD Elaine Buck    Anxiety disorder Elaine Buck    Colon cancer Neg Hx    Esophageal cancer Neg Hx    Stomach cancer Neg Hx    Rectal cancer Neg Hx    Social History   Socioeconomic History   Marital status: Married    Spouse name: Elaine Buck   Number of children: 2   Years of education: Elaine Buck   Highest education level: Some college, no degree  Occupational History   Occupation: retired  Tobacco Use   Smoking status: Every Day    Packs/day: 0.80    Years: 20.00    Pack years: 16.00    Types: Cigarettes   Smokeless tobacco:  Never   Tobacco comments:    Declines quitting for now  Vaping Use   Vaping Use: Never used  Substance and Sexual Activity   Alcohol use: Yes    Comment: Occasionlly   Drug use: No   Sexual activity: Yes    Partners: Male  Other Topics Concern   Elaine Buck  Social History Narrative   Left Handed   Lives in a one story home   Drinks one cup of coffee in the morning and Decaf in the evening    Social Determinants of Health   Financial Resource Strain: Elaine Buck  Food Insecurity: Elaine Buck  Transportation Needs: Elaine Buck  Physical Activity: Elaine Buck  Stress: Elaine Buck  Social Connections: Elaine Buck   Past Surgical History:  Procedure Laterality Date   BREAST BIOPSY     BREAST LUMPECTOMY Left 05/22/2003   LCIS Cells found in breast tissue   FOOT SURGERY Left 04/2019   joint inplantment in big toe    TUBAL LIGATION     Past Medical History:  Diagnosis Date   Anxiety    Arthritis    Cancer ([place])  breast (Left)   History of colon polyps    History of lobular carcinoma in situ (LCIS) of  left breast    s/p lumpectomy in 2005, Tamoxifen/Evista therapy for 5 years   Hyperlipidemia    Neuromuscular disorder ([facility])    small fiber neuropathy, L foot    Thyroid disease    BP 117/75   Pulse 82   Temp 98.1 F (36.7 C)   Ht 5\' 6"  (1.676 m)   Wt 201 lb 3.2 oz (91.3 kg)   SpO2 96%   BMI 32.47 kg/m   Opioid Risk Score:   Fall Risk Score:  `1  Depression screen PHQ 2/9  Depression screen [facility] 2/9 12/24/2020 10/21/2016  Decreased Interest 0 0  Down, Depressed, Hopeless 0 0  PHQ - 2 Score 0 0  Altered sleeping 0 0  Tired, decreased energy 0 0  Change in appetite 1 0  Feeling bad or failure about yourself  0 0  Trouble concentrating 0 0  Moving slowly or fidgety/restless 0 0  Suicidal thoughts 0 0  PHQ-9 Score 1 0     Review of Systems  Constitutional:  Positive for unexpected weight change.       Gain  HENT: Negative.    Eyes: Negative.    Respiratory: Negative.    Cardiovascular: Negative.   Gastrointestinal: Negative.   Endocrine: Negative.   Genitourinary: Negative.   Musculoskeletal:  Positive for gait problem.       Spasms  Skin: Negative.   Allergic/Immunologic: Negative.   Neurological:  Positive for numbness.       Burning  Hematological: Negative.   Psychiatric/Behavioral: Negative.    All other systems reviewed and are negative.     Objective:   Physical Exam Gen: no distress, normal appearing HEENT: oral mucosa pink and moist, NCAT Cardio: Reg rate Chest: normal effort, normal rate of breathing Abd: soft, non-distended Ext: no edema Psych: pleasant, normal affect Skin: intact Neuro: Alert and oriented x3. Impaired balance.     Assessment & Plan:   1) Chronic Pain Syndrome secondary to CRPS of left foot -Discussed current symptoms of pain and history of pain.  -Discussed benefits of exercise in reducing pain. -Discussed Qutenza as an option for neuropathic pain control. Discussed that this is a capsaicin patch, stronger than capsaicin cream. Discussed that it is currently approved for diabetic peripheral neuropathy and post-herpetic neuralgia, but that it has also shown benefit in treating other forms of neuropathy. Provided patient with link to site to learn more about the patch: CinemaBonus.fr. Discussed that the patch would be placed in office and benefits usually last 3 months. Discussed that unintended exposure to capsaicin can cause severe irritation of eyes, mucous membranes, respiratory tract, and skin, but that Qutenza is a local treatment and does Elaine have the systemic side effects of other nerve medications. Discussed that there may be pain, itching, erythema, and decreased sensory function associated with the application of Qutenza. Side effects usually subside within 1 week. A cold pack of analgesic medications can help with these side effects. Blood pressure can also be increased due  to pain associated with administration of the patch.  -referred for aquatherapy  -referred for sympathetic nerve block -prescribed amitriptyline 10mg  HS -recommended starting NAC 600mg  BID -Discussed following foods that may reduce pain: 1) Ginger (especially studied for arthritis)- reduce leukotriene production to decrease inflammation 2) Blueberries- high in phytonutrients that decrease inflammation 3) Salmon- marine omega-3s reduce joint swelling and pain 4) Pumpkin  seeds- reduce inflammation 5) dark chocolate- reduces inflammation 6) turmeric- reduces inflammation 7) tart cherries - reduce pain and stiffness 8) extra virgin olive oil - its compound olecanthal helps to block prostaglandins  9) chili peppers- can be eaten or applied topically via capsaicin 10) mint- helpful for headache, muscle aches, joint pain, and itching 11) garlic- reduces inflammation  Link to further information on diet for chronic pain: http://www.randall.[email or web address]   2) Muscle cramps: -continue muscle spasm medication -try foods high in potassium and magnesium

## 2020-12-24 NOTE — Patient Instructions (Addendum)
-  Discussed following foods that may reduce pain: 1) Ginger (especially studied for arthritis)- reduce leukotriene production to decrease inflammation 2) Blueberries- high in phytonutrients that decrease inflammation 3) Salmon- marine omega-3s reduce joint swelling and pain 4) Pumpkin seeds- reduce inflammation 5) dark chocolate- reduces inflammation 6) turmeric- reduces inflammation 7) tart cherries - reduce pain and stiffness 8) extra virgin olive oil - its compound olecanthal helps to block prostaglandins  9) chili peppers- can be eaten or applied topically via capsaicin 10) mint- helpful for headache, muscle aches, joint pain, and itching 11) garlic- reduces inflammation  Link to further information on diet for chronic pain: http://www.randall.[email or web address]    N-acetyl cysteine (NAC) 600mg  BID

## 2020-12-30 DIAGNOSIS — H04123 Dry eye syndrome of bilateral lacrimal glands: Secondary | ICD-10-CM | POA: Diagnosis not present

## 2021-01-03 ENCOUNTER — Ambulatory Visit: Payer: Medicare Other | Admitting: Podiatry

## 2021-01-03 ENCOUNTER — Other Ambulatory Visit: Payer: Self-pay

## 2021-01-03 DIAGNOSIS — G9050 Complex regional pain syndrome I, unspecified: Secondary | ICD-10-CM

## 2021-01-03 DIAGNOSIS — M205X2 Other deformities of toe(s) (acquired), left foot: Secondary | ICD-10-CM

## 2021-01-03 MED ORDER — DICLOFENAC EPOLAMINE 1.3 % EX PTCH: 1.0000 | MEDICATED_PATCH | Freq: Two times a day (BID) | CUTANEOUS | 1 refills | Status: DC

## 2021-01-03 NOTE — Progress Notes (Signed)
Subjective: Elaine Buck is a 65 y.o. is seen today in office s/p left foot replacement of 1st MTPJ implant, IPJ fusion and sesamoidectomy preformed on 05/15/2020.  She states that the toe is still has not changed and points out causing discomfort when she walks.  She is also having nerve symptoms and is concerned about CRPS and her follow-up with pain management for this.  They have changed her medications and she is also hopeful he can try Qutenza patches.  We also discussed symptomatic nerve block.  Objective: General: No acute distress, AAOx3  DP/PT pulses palpable 2/4, CRT < 3 sec to all digits.  Protective sensation intact. Motor function intact.  LEFT foot: Incision is well coapted without any evidence of dehiscence and a scar is formed.  Hallux varus is present on the left side.  There is tenderness on the first interspace on the second MPJ as well as some mild edema but there is no erythema or warmth.  Temperature gradient equal bilaterally. MMT 5/5 No other open lesions or pre-ulcerative lesions.  No pain with calf compression, swelling, warmth, erythema.   Assessment and Plan:  Left hallux varus, neuropathy-concern for CRPS   -Treatment options discussed including all alternatives, risks, and complications -At this point going to try to avoid any further surgery or anything invasive on the foot to help avoid any further trauma given the concern for the CRPS.  Continue to follow-up with pain management and concerns for the CRPS.  I had previously purchased offloading pads and ointment to this today as well.  Muscular try for Flector patches.  Continue with shoes and good arch support in shoes to avoid any pressure.  [name]

## 2021-01-07 ENCOUNTER — Other Ambulatory Visit: Payer: Self-pay

## 2021-01-07 ENCOUNTER — Encounter: Payer: Self-pay | Admitting: Physical Medicine and Rehabilitation

## 2021-01-07 ENCOUNTER — Encounter: Payer: Self-pay | Admitting: Podiatry

## 2021-01-07 ENCOUNTER — Encounter: Payer: Medicare Other | Admitting: Physical Medicine and Rehabilitation

## 2021-01-07 VITALS — BP 120/79 | HR 77 | Temp 98.2°F | Ht 66.0 in | Wt 198.0 lb

## 2021-01-07 DIAGNOSIS — G90522 Complex regional pain syndrome I of left lower limb: Secondary | ICD-10-CM | POA: Diagnosis not present

## 2021-01-07 DIAGNOSIS — R252 Cramp and spasm: Secondary | ICD-10-CM | POA: Diagnosis not present

## 2021-01-07 DIAGNOSIS — G629 Polyneuropathy, unspecified: Secondary | ICD-10-CM | POA: Diagnosis not present

## 2021-01-07 MED ORDER — SAVELLA 12.5 MG PO TABS: 1.0000 | ORAL_TABLET | Freq: Every day | ORAL | 3 refills | Status: DC

## 2021-01-07 NOTE — Progress Notes (Signed)
Subjective:    Patient ID: Elaine Buck, female    DOB: 06-29-1955, 65 y.o.   MRN: [id number]  HPI Elaine Buck is a 65 year old woman who presents for bilateral foot pain.   -she has tightness in both feet,  -pain is 95% on the left foot, 5% in the right foot -she has had surgery on her left foot -she has had 4 surgeries in the same area- first was in 2020.  -she has tried Gabapentin and Nortriptyline- niether of which helped. She has also failed lyrica. She has not tried [place] and is willing to.  -Pain is worst at night- she has a hard time falling asleep -touching the sheets causes exquisite pain -lot of cramping in left leg. -her family doctor put her on muscle relaxers which does help.  -Leg cramps have worsened after surgery.  She has been diagnosed bu her podiatrist with CRPS -she would like to try Qutenza today -she had no benefits with Amitriptyline 25mg  and also experienced severe dry mouth so she stopped the medication.   Pain Inventory Average Pain 7 Pain Right Now 7 My pain is constant, burning, aching, and tight feeling  In the last 24 hours, has pain interfered with the following? General activity 7 Relation with others 0 Enjoyment of life 4 What TIME of day is your pain at its worst? morning , daytime, evening, and night Sleep (in general) Fair  Pain is worse with: walking, standing, and leg raised Pain improves with:  nothing Relief from Meds: 0    "walk a mile"  retired  numbness trouble walking spasms  Any changes since last visit?  no  Dr Celesta Gentile referral MD (podiarist)    Family History  Problem Relation Age of Onset   Stroke Mother    Bipolar disorder Son    ADD / ADHD Son    Anxiety disorder Son    Colon cancer Neg Hx    Esophageal cancer Neg Hx    Stomach cancer Neg Hx    Rectal cancer Neg Hx    Social History   Socioeconomic History   Marital status: Married    Spouse name: Not on file   Number of children: 2    Years of education: Not on file   Highest education level: Some college, no degree  Occupational History   Occupation: retired  Tobacco Use   Smoking status: Every Day    Packs/day: 0.80    Years: 20.00    Pack years: 16.00    Types: Cigarettes   Smokeless tobacco: Never   Tobacco comments:    Declines quitting for now  Vaping Use   Vaping Use: Never used  Substance and Sexual Activity   Alcohol use: Yes    Comment: Occasionlly   Drug use: No   Sexual activity: Yes    Partners: Male  Other Topics Concern   Not on file  Social History Narrative   Left Handed   Lives in a one story home   Drinks one cup of coffee in the morning and Decaf in the evening    Social Determinants of Health   Financial Resource Strain: Not on file  Food Insecurity: Not on file  Transportation Needs: Not on file  Physical Activity: Not on file  Stress: Not on file  Social Connections: Not on file   Past Surgical History:  Procedure Laterality Date   BREAST BIOPSY     BREAST LUMPECTOMY Left 05/22/2003   LCIS  Cells found in breast tissue   FOOT SURGERY Left 04/2019   joint inplantment in big toe    TUBAL LIGATION     Past Medical History:  Diagnosis Date   Anxiety    Arthritis    Cancer ([place])    breast (Left)   History of colon polyps    History of lobular carcinoma in situ (LCIS) of  left breast    s/p lumpectomy in 2005, Tamoxifen/Evista therapy for 5 years   Hyperlipidemia    Neuromuscular disorder ([place])    small fiber neuropathy, L foot    Thyroid disease    BP 120/79   Pulse 77   Temp 98.2 F (36.8 C)   Ht 5\' 6"  (1.676 m)   Wt 198 lb (89.8 kg)   SpO2 96%   BMI 31.96 kg/m   Opioid Risk Score:   Fall Risk Score:  `1  Depression screen PHQ 2/9  Depression screen [facility] 2/9 01/07/2021 12/24/2020 10/21/2016  Decreased Interest 0 0 0  Down, Depressed, Hopeless 0 0 0  PHQ - 2 Score 0 0 0  Altered sleeping - 0 0  Tired, decreased energy - 0 0  Change in appetite - 1 0   Feeling bad or failure about yourself  - 0 0  Trouble concentrating - 0 0  Moving slowly or fidgety/restless - 0 0  Suicidal thoughts - 0 0  PHQ-9 Score - 1 0     Review of Systems  Constitutional:  Positive for unexpected weight change.       Gain  HENT: Negative.    Eyes: Negative.   Respiratory: Negative.    Cardiovascular: Negative.   Gastrointestinal: Negative.   Endocrine: Negative.   Genitourinary: Negative.   Musculoskeletal:  Positive for gait problem.       Spasms  Skin: Negative.   Allergic/Immunologic: Negative.   Neurological:  Positive for numbness.       Burning  Hematological: Negative.   Psychiatric/Behavioral: Negative.    All other systems reviewed and are negative.     Objective:   Physical Exam Gen: no distress, normal appearing HEENT: oral mucosa pink and moist, NCAT Cardio: Reg rate Chest: normal effort, normal rate of breathing Abd: soft, non-distended Ext: no edema Psych: pleasant, normal affect Skin: intact Neuro: Alert and oriented x3. Impaired balance. Decreased sensation throughout left foot.     Assessment & Plan:   1) Chronic Pain Syndrome secondary to CRPS of left foot -Discussed current symptoms of pain and history of pain.  -Discussed benefits of exercise in reducing pain. -Discussed Qutenza as an option for neuropathic pain control. Discussed that this is a capsaicin patch, stronger than capsaicin cream. Discussed that it is currently approved for diabetic peripheral neuropathy and post-herpetic neuralgia, but that it has also shown benefit in treating other forms of neuropathy. Provided patient with link to site to learn more about the patch: [employer]. Discussed that the patch would be placed in office and benefits usually last 3 months. Discussed that unintended exposure to capsaicin can cause severe irritation of eyes, mucous membranes, respiratory tract, and skin, but that Qutenza is a local treatment and does not  have the systemic side effects of other nerve medications. Discussed that there may be pain, itching, erythema, and decreased sensory function associated with the application of Qutenza. Side effects usually subside within 1 week. A cold pack of analgesic medications can help with these side effects. Blood pressure can also be increased due to pain  associated with administration of the patch.   1 patch of Qutenza was applied to the area of pain. Ice packs were offered during the procedure to ensure patient comfort. Blood pressure was monitored every 15 minutes. The patient tolerated the procedure well. Post-procedure instructions were given and follow-up has been scheduled.     -referred for aquatherapy in [place].  -referred for sympathetic nerve block with [place] [facility]- says she has not yet heard from them. Discussed how sympathetic nerve block can be diagnostic and therapeutic for CRPS -stop amitriptyline given side effect of dry mouth/lack of efficayc.  -recommended starting NAC 600mg  BID -Discussed following foods that may reduce pain: 1) Ginger (especially studied for arthritis)- reduce leukotriene production to decrease inflammation 2) Blueberries- high in phytonutrients that decrease inflammation 3) Salmon- marine omega-3s reduce joint swelling and pain 4) Pumpkin seeds- reduce inflammation 5) dark chocolate- reduces inflammation 6) turmeric- reduces inflammation 7) tart cherries - reduce pain and stiffness 8) extra virgin olive oil - its compound olecanthal helps to block prostaglandins  9) chili peppers- can be eaten or applied topically via capsaicin 10) mint- helpful for headache, muscle aches, joint pain, and itching 11) garlic- reduces inflammation  Link to further information on diet for chronic pain: http://www.randall.[email or web address]   2) Muscle cramps: -continue muscle spasm medication -try foods high in  potassium and magnesium  3) insomnia secondary to pain -trial savella 12.5mg  HS -discussed regarding symptoms of serotonin syndrome to look out for given concurrent Effexor use.

## 2021-01-10 ENCOUNTER — Telehealth: Payer: Self-pay | Admitting: *Deleted

## 2021-01-10 NOTE — Telephone Encounter (Signed)
Did the pre cert for the patches on cover my meds today. Lattie Haw

## 2021-01-15 ENCOUNTER — Encounter: Payer: Self-pay | Admitting: Podiatry

## 2021-02-04 ENCOUNTER — Encounter
Payer: Medicare Other | Attending: Physical Medicine and Rehabilitation | Admitting: Physical Medicine and Rehabilitation

## 2021-02-04 ENCOUNTER — Other Ambulatory Visit: Payer: Self-pay

## 2021-02-04 DIAGNOSIS — G629 Polyneuropathy, unspecified: Secondary | ICD-10-CM | POA: Insufficient documentation

## 2021-02-04 DIAGNOSIS — R252 Cramp and spasm: Secondary | ICD-10-CM | POA: Insufficient documentation

## 2021-02-04 DIAGNOSIS — G90522 Complex regional pain syndrome I of left lower limb: Secondary | ICD-10-CM | POA: Insufficient documentation

## 2021-02-04 MED ORDER — TOPIRAMATE 25 MG PO TABS: 25.0000 mg | ORAL_TABLET | Freq: Every evening | ORAL | 3 refills | Status: DC

## 2021-02-04 NOTE — Progress Notes (Signed)
Subjective:    Patient ID: Elaine Buck, female    DOB: 1955/10/26, 65 y.o.   MRN: [id number]  HPI An audio/video tele-health visit is felt to be the most appropriate encounter for this patient at this time.  This is a follow up tele-visit via phone. The patient is at home. MD is at office.    Elaine Buck is a 65 year old woman who presents for follow-up of bilateral foot pain.  -she has tightness in both feet,  -pain is 95% on the left foot, 5% in the right foot -she has had surgery on her left foot -she has had 4 surgeries in the same area- first was in 2020.  -she has tried Gabapentin and Nortriptyline- niether of which helped. She has also failed lyrica. She has not tried [place] and is willing to.  -Pain is worst at night- she has a hard time falling asleep -touching the sheets causes exquisite pain -lot of cramping in left leg. -her family doctor put her on muscle relaxers which does help.  -Leg cramps have worsened after surgery.  She has been diagnosed bu her podiatrist with CRPS -she  -she had no benefits with Amitriptyline 25mg  and also experienced severe dry mouth so she stopped the medication.  -she had no benefit from [place] but would like to try again -notes that cold weather worsens her pain  Insomnia:  -she found that the [facility] was very helpful for sleep, but cost prohibitive. It was not covered by insurance so she would not like to continue this.  -prefers not to take steroids if they will worsen her feet  Pain Inventory Average Pain 7 Pain Right Now 7 My pain is constant, burning, aching, and tight feeling  In the last 24 hours, has pain interfered with the following? General activity 7 Relation with others 0 Enjoyment of life 4 What TIME of day is your pain at its worst? morning , daytime, evening, and night Sleep (in general) Fair  Pain is worse with: walking, standing, and leg raised Pain improves with:  nothing Relief from Meds: 0    "walk a  mile"  retired  numbness trouble walking spasms  Any changes since last visit?  no  Dr Celesta Gentile referral MD (podiarist)    Family History  Problem Relation Age of Onset   Stroke Mother    Bipolar disorder Son    ADD / ADHD Son    Anxiety disorder Son    Colon cancer Neg Hx    Esophageal cancer Neg Hx    Stomach cancer Neg Hx    Rectal cancer Neg Hx    Social History   Socioeconomic History   Marital status: Married    Spouse name: Not on file   Number of children: 2   Years of education: Not on file   Highest education level: Some college, no degree  Occupational History   Occupation: retired  Tobacco Use   Smoking status: Every Day    Packs/day: 0.80    Years: 20.00    Pack years: 16.00    Types: Cigarettes   Smokeless tobacco: Never   Tobacco comments:    Declines quitting for now  Vaping Use   Vaping Use: Never used  Substance and Sexual Activity   Alcohol use: Yes    Comment: Occasionlly   Drug use: No   Sexual activity: Yes    Partners: Male  Other Topics Concern   Not on file  Social History  Narrative   Left Handed   Lives in a one story home   Drinks one cup of coffee in the morning and Decaf in the evening    Social Determinants of Health   Financial Resource Strain: Not on file  Food Insecurity: Not on file  Transportation Needs: Not on file  Physical Activity: Not on file  Stress: Not on file  Social Connections: Not on file   Past Surgical History:  Procedure Laterality Date   BREAST BIOPSY     BREAST LUMPECTOMY Left 05/22/2003   LCIS Cells found in breast tissue   FOOT SURGERY Left 04/2019   joint inplantment in big toe    TUBAL LIGATION     Past Medical History:  Diagnosis Date   Anxiety    Arthritis    Cancer ([place])    breast (Left)   History of colon polyps    History of lobular carcinoma in situ (LCIS) of  left breast    s/p lumpectomy in 2005, Tamoxifen/Evista therapy for 5 years   Hyperlipidemia     Neuromuscular disorder ([place])    small fiber neuropathy, L foot    Thyroid disease    There were no vitals taken for this visit.  Opioid Risk Score:   Fall Risk Score:  `1  Depression screen PHQ 2/9  Depression screen [facility] 2/9 01/07/2021 12/24/2020 10/21/2016  Decreased Interest 0 0 0  Down, Depressed, Hopeless 0 0 0  PHQ - 2 Score 0 0 0  Altered sleeping - 0 0  Tired, decreased energy - 0 0  Change in appetite - 1 0  Feeling bad or failure about yourself  - 0 0  Trouble concentrating - 0 0  Moving slowly or fidgety/restless - 0 0  Suicidal thoughts - 0 0  PHQ-9 Score - 1 0     Review of Systems  Constitutional:  Positive for unexpected weight change.       Gain  HENT: Negative.    Eyes: Negative.   Respiratory: Negative.    Cardiovascular: Negative.   Gastrointestinal: Negative.   Endocrine: Negative.   Genitourinary: Negative.   Musculoskeletal:  Positive for gait problem.       Spasms  Skin: Negative.   Allergic/Immunologic: Negative.   Neurological:  Positive for numbness.       Burning  Hematological: Negative.   Psychiatric/Behavioral: Negative.    All other systems reviewed and are negative.     Objective:   Physical Exam Not performed as patient was seen via phone    Assessment & Plan:   1) Chronic Pain Syndrome secondary to CRPS of left foot -Discussed current symptoms of pain and history of pain.  -Discussed benefits of exercise in reducing pain. -Discussed Qutenza as an option for neuropathic pain control. Discussed that this is a capsaicin patch, stronger than capsaicin cream. Discussed that it is currently approved for diabetic peripheral neuropathy and post-herpetic neuralgia, but that it has also shown benefit in treating other forms of neuropathy. Provided patient with link to site to learn more about the patch: [employer]fr. Discussed that the patch would be placed in office and benefits usually last 3 months. Discussed that unintended  exposure to capsaicin can cause severe irritation of eyes, mucous membranes, respiratory tract, and skin, but that Qutenza is a local treatment and does not have the systemic side effects of other nerve medications. Discussed that there may be pain, itching, erythema, and decreased sensory function associated with the application of Qutenza.  Side effects usually subside within 1 week. A cold pack of analgesic medications can help with these side effects. Blood pressure can also be increased due to pain associated with administration of the patch.    -referred for aquatherapy in [place].  -referred for sympathetic nerve block with [place] [facility]- says she has not yet heard from them. Discussed how sympathetic nerve block can be diagnostic and therapeutic for CRPS -stop amitriptyline given side effect of dry mouth/lack of efficayc.  -recommended starting NAC 600mg  BID -Discussed following foods that may reduce pain: 1) Ginger (especially studied for arthritis)- reduce leukotriene production to decrease inflammation 2) Blueberries- high in phytonutrients that decrease inflammation 3) Salmon- marine omega-3s reduce joint swelling and pain 4) Pumpkin seeds- reduce inflammation 5) dark chocolate- reduces inflammation 6) turmeric- reduces inflammation 7) tart cherries - reduce pain and stiffness 8) extra virgin olive oil - its compound olecanthal helps to block prostaglandins  9) chili peppers- can be eaten or applied topically via capsaicin 10) mint- helpful for headache, muscle aches, joint pain, and itching 11) garlic- reduces inflammation  Link to further information on diet for chronic pain: http://www.randall.com/   2) Muscle cramps: -continue muscle spasm medication -try foods high in potassium and magnesium  3) insomnia secondary to pain D/c savella since too expensive Prescribed topamax 25mg  HS -discussed regarding  symptoms of serotonin syndrome to look out for given concurrent Effexor use.   7 minutes spent in discussion of her response to and cost of savella, trial of topamax, discussion of its risks and benefits, referring again for sympathetic nerve block

## 2021-02-21 DIAGNOSIS — G894 Chronic pain syndrome: Secondary | ICD-10-CM | POA: Diagnosis not present

## 2021-02-21 DIAGNOSIS — G90522 Complex regional pain syndrome I of left lower limb: Secondary | ICD-10-CM | POA: Diagnosis not present

## 2021-02-24 ENCOUNTER — Ambulatory Visit (INDEPENDENT_AMBULATORY_CARE_PROVIDER_SITE_OTHER): Payer: Medicare Other | Admitting: Family Medicine

## 2021-02-24 ENCOUNTER — Encounter: Payer: Self-pay | Admitting: Family Medicine

## 2021-02-24 VITALS — BP 108/68 | HR 84 | Temp 98.3°F | Ht 66.0 in | Wt 197.0 lb

## 2021-02-24 DIAGNOSIS — Z0001 Encounter for general adult medical examination with abnormal findings: Secondary | ICD-10-CM | POA: Diagnosis not present

## 2021-02-24 DIAGNOSIS — Z23 Encounter for immunization: Secondary | ICD-10-CM | POA: Diagnosis not present

## 2021-02-24 DIAGNOSIS — E039 Hypothyroidism, unspecified: Secondary | ICD-10-CM

## 2021-02-24 DIAGNOSIS — E785 Hyperlipidemia, unspecified: Secondary | ICD-10-CM | POA: Diagnosis not present

## 2021-02-24 DIAGNOSIS — Z0000 Encounter for general adult medical examination without abnormal findings: Secondary | ICD-10-CM

## 2021-02-24 DIAGNOSIS — M7501 Adhesive capsulitis of right shoulder: Secondary | ICD-10-CM | POA: Diagnosis not present

## 2021-02-24 LAB — COMPREHENSIVE METABOLIC PANEL
ALT: 31 U/L (ref 0–35)
AST: 28 U/L (ref 0–37)
Albumin: 4.8 g/dL (ref 3.5–5.2)
Alkaline Phosphatase: 71 U/L (ref 39–117)
BUN: 21 mg/dL (ref 6–23)
CO2: 28 mEq/L (ref 19–32)
Calcium: 9.7 mg/dL (ref 8.4–10.5)
Chloride: 102 mEq/L (ref 96–112)
Creatinine, Ser: 0.87 mg/dL (ref 0.40–1.20)
GFR: 69.45 mL/min (ref >60.00)
Glucose, Bld: 104 mg/dL — ABNORMAL HIGH (ref 70–99)
Potassium: 4.7 mEq/L (ref 3.5–5.1)
Sodium: 137 mEq/L (ref 135–145)
Total Bilirubin: 0.6 mg/dL (ref 0.2–1.2)
Total Protein: 7.3 g/dL (ref 6.0–8.3)

## 2021-02-24 LAB — LIPID PANEL
Cholesterol: 160 mg/dL (ref 0–200)
HDL: 44.6 mg/dL (ref >39.00)
LDL Cholesterol: 94 mg/dL (ref 0–99)
NonHDL: 115.82
Total CHOL/HDL Ratio: 4
Triglycerides: 111 mg/dL (ref 0.0–149.0)
VLDL: 22.2 mg/dL (ref 0.0–40.0)

## 2021-02-24 LAB — TSH: TSH: 0.04 u[IU]/mL — ABNORMAL LOW (ref 0.35–5.50)

## 2021-02-24 LAB — T4, FREE: Free T4: 1.22 ng/dL (ref 0.60–1.60)

## 2021-02-24 MED ORDER — METHYLPREDNISOLONE ACETATE 40 MG/ML IJ SUSP
40.0000 mg | Freq: Once | INTRAMUSCULAR | Status: DC
Start: 2021-02-24 — End: 2021-02-24

## 2021-02-24 MED ORDER — METHYLPREDNISOLONE ACETATE 40 MG/ML IJ SUSP
40.0000 mg | Freq: Once | INTRAMUSCULAR | Status: AC
  Administered 2021-02-24: 40 mg via INTRA_ARTICULAR

## 2021-02-24 NOTE — Patient Instructions (Addendum)
Give [place] 2-3 business days to get the results of your labs back.   Keep the diet clean and stay active.  Consider yoga (chair yoga), lifting bands/dumbbells while sitting, stretching.   Don't worry about your back.   Let us know if you need anything.  EXERCISES  RANGE OF MOTION (ROM) AND STRETCHING EXERCISES These exercises may help you when beginning to rehabilitate your injury. While completing these exercises, remember:  Restoring tissue flexibility helps normal motion to return to the joints. This allows healthier, less painful movement and activity. An effective stretch should be held for at least 30 seconds. A stretch should never be painful. You should only feel a gentle lengthening or release in the stretched tissue.  ROM - Pendulum Bend at the waist so that your right / left arm falls away from your body. Support yourself with your opposite hand on a solid surface, such as a table or a countertop. Your right / left arm should be perpendicular to the ground. If it is not perpendicular, you need to lean over farther. Relax the muscles in your right / left arm and shoulder as much as possible. Gently sway your hips and trunk so they move your right / left arm without any use of your right / left shoulder muscles. Progress your movements so that your right / left arm moves side to side, then forward and backward, and finally, both clockwise and counterclockwise. Complete 10-15 repetitions in each direction. Many people use this exercise to relieve discomfort in their shoulder as well as to gain range of motion. Repeat 2 times. Complete this exercise 3 times per week.  STRETCH - Flexion, Standing Stand with good posture. With an underhand grip on your right / left hand and an overhand grip on the opposite hand, grasp a broomstick or cane so that your hands are a little more than shoulder-width apart. Keeping your right / left elbow straight and shoulder muscles relaxed, push the stick  with your opposite hand to raise your right / left arm in front of your body and then overhead. Raise your arm until you feel a stretch in your right / left shoulder, but before you have increased shoulder pain. Try to avoid shrugging your right / left shoulder as your arm rises by keeping your shoulder blade tucked down and toward your mid-back spine. Hold 30 seconds. Slowly return to the starting position. Repeat 2 times. Complete this exercise 3 times per week.  STRETCH - Internal Rotation Place your right / left hand behind your back, palm-up. Throw a towel or belt over your opposite shoulder. Grasp the towel/belt with your right / left hand. While keeping an upright posture, gently pull up on the towel/belt until you feel a stretch in the front of your right / left shoulder. Avoid shrugging your right / left shoulder as your arm rises by keeping your shoulder blade tucked down and toward your mid-back spine. Hold 30. Release the stretch by lowering your opposite hand. Repeat 2 times. Complete this exercise 3 times per week.  STRETCH - External Rotation and Abduction Stagger your stance through a doorframe. It does not matter which foot is forward. As instructed by your physician, physical therapist or athletic trainer, place your hands: And forearms above your head and on the door frame. And forearms at head-height and on the door frame. At elbow-height and on the door frame. Keeping your head and chest upright and your stomach muscles tight to prevent over-extending your low-back,  slowly shift your weight onto your front foot until you feel a stretch across your chest and/or in the front of your shoulders. Hold 30 seconds. Shift your weight to your back foot to release the stretch. Repeat 2 times. Complete this stretch 3 times per week.   STRENGTHENING EXERCISES  These exercises may help you when beginning to rehabilitate your injury. They may resolve your symptoms with or without  further involvement from your physician, physical therapist or athletic trainer. While completing these exercises, remember:  Muscles can gain both the endurance and the strength needed for everyday activities through controlled exercises. Complete these exercises as instructed by your physician, physical therapist or athletic trainer. Progress the resistance and repetitions only as guided. You may experience muscle soreness or fatigue, but the pain or discomfort you are trying to eliminate should never worsen during these exercises. If this pain does worsen, stop and make certain you are following the directions exactly. If the pain is still present after adjustments, discontinue the exercise until you can discuss the trouble with your clinician. If advised by your physician, during your recovery, avoid activity or exercises which involve actions that place your right / left hand or elbow above your head or behind your back or head. These positions stress the tissues which are trying to heal.  STRENGTH - Scapular Depression and Adduction With good posture, sit on a firm chair. Supported your arms in front of you with pillows, arm rests or a table top. Have your elbows in line with the sides of your body. Gently draw your shoulder blades down and toward your mid-back spine. Gradually increase the tension without tensing the muscles along the top of your shoulders and the back of your neck. Hold for 3 seconds. Slowly release the tension and relax your muscles completely before completing the next repetition. After you have practiced this exercise, remove the arm support and complete it in standing as well as sitting. Repeat 2 times. Complete this exercise 3 times per week.   STRENGTH - External Rotators Secure a rubber exercise band/tubing to a fixed object so that it is at the same height as your right / left elbow when you are standing or sitting on a firm surface. Stand or sit so that the secured  exercise band/tubing is at your side that is not injured. Bend your elbow 90 degrees. Place a folded towel or small pillow under your right / left arm so that your elbow is a few inches away from your side. Keeping the tension on the exercise band/tubing, pull it away from your body, as if pivoting on your elbow. Be sure to keep your body steady so that the movement is only coming from your shoulder rotating. Hold 3 seconds. Release the tension in a controlled manner as you return to the starting position. Repeat 2 times. Complete this exercise 3 times per week.   STRENGTH - Supraspinatus Stand or sit with good posture. Grasp a 2-3 lb weight or an exercise band/tubing so that your hand is "thumbs-up," like when you shake hands. Slowly lift your right / left hand from your thigh into the air, traveling about 30 degrees from straight out at your side. Lift your hand to shoulder height or as far as you can without increasing any shoulder pain. Initially, many people do not lift their hands above shoulder height. Avoid shrugging your right / left shoulder as your arm rises by keeping your shoulder blade tucked down and toward your mid-back  spine. Hold for 3 seconds. Control the descent of your hand as you slowly return to your starting position. Repeat 2 times. Complete this exercise 3 times per week.   STRENGTH - Shoulder Extensors Secure a rubber exercise band/tubing so that it is at the height of your shoulders when you are either standing or sitting on a firm arm-less chair. With a thumbs-up grip, grasp an end of the band/tubing in each hand. Straighten your elbows and lift your hands straight in front of you at shoulder height. Step back away from the secured end of band/tubing until it becomes tense. Squeezing your shoulder blades together, pull your hands down to the sides of your thighs. Do not allow your hands to go behind you. Hold for 3 seconds. Slowly ease the tension on the band/tubing as  you reverse the directions and return to the starting position. Repeat 2 times. Complete this exercise 3 times per week.   STRENGTH - Scapular Retractors Secure a rubber exercise band/tubing so that it is at the height of your shoulders when you are either standing or sitting on a firm arm-less chair. With a palm-down grip, grasp an end of the band/tubing in each hand. Straighten your elbows and lift your hands straight in front of you at shoulder height. Step back away from the secured end of band/tubing until it becomes tense. Squeezing your shoulder blades together, draw your elbows back as you bend them. Keep your upper arm lifted away from your body throughout the exercise. Hold 3 seconds. Slowly ease the tension on the band/tubing as you reverse the directions and return to the starting position. Repeat 2 times. Complete this exercise 3 times per week.  STRENGTH - Scapular Depressors Find a sturdy chair without wheels, such as a from a dining room table. Keeping your feet on the floor, lift your bottom from the seat and lock your elbows. Keeping your elbows straight, allow gravity to pull your body weight down. Your shoulders will rise toward your ears. Raise your body against gravity by drawing your shoulder blades down your back, shortening the distance between your shoulders and ears. Although your feet should always maintain contact with the floor, your feet should progressively support less body weight as you get stronger. Hold 3 seconds. In a controlled and slow manner, lower your body weight to begin the next repetition. Repeat 2 times. Complete this exercise 3 times per week.    This information is not intended to replace advice given to you by your health care provider. Make sure you discuss any questions you have with your health care provider.   Document Released: 01/21/2005 Document Revised: 03/30/2014 Document Reviewed: 06/21/2008 Elsevier Interactive Patient Education [employer]

## 2021-02-24 NOTE — Progress Notes (Signed)
Chief Complaint  Patient presents with   Annual Exam     Well Woman Elaine Buck is here for a complete physical.   She is a bit blue due to the passing of a close friend.  Her last physical was >1 year ago.  Current diet: in general, a "healthy" diet. Current exercise: none due to foot. Weight is stable and she denies daytime fatigue. Seatbelt? Yes  Health Maintenance Colonoscopy- Yes Shingrix- Yes Lung cancer screening- Yes DEXA- Yes Mammogram- Yes Tetanus- Yes Pneumonia- Due for PCV20 Hep C screen- Yes  R shoulder pain 3 mo pain anteriorly, no inj or change in activity. No bruising, redness, swelling, neuro s/s's. Some decreased ROM. Hx of frozen shoulder on L.   Past Medical History:  Diagnosis Date   Anxiety    Arthritis    Cancer ([place])    breast (Left)   History of colon polyps    History of lobular carcinoma in situ (LCIS) of  left breast    s/p lumpectomy in 2005, Tamoxifen/Evista therapy for 5 years   Hyperlipidemia    Neuromuscular disorder ([place])    small fiber neuropathy, L foot    Thyroid disease      Past Surgical History:  Procedure Laterality Date   BREAST BIOPSY     BREAST LUMPECTOMY Left 05/22/2003   LCIS Cells found in breast tissue   FOOT SURGERY Left 04/2019   joint inplantment in big toe    TUBAL LIGATION      Medications  Current Outpatient Medications on File Prior to Visit  Medication Sig Dispense Refill   ALPRAZolam (XANAX) 0.5 MG tablet Take 1 tablet (0.5 mg total) by mouth daily as needed for anxiety. 30 tablet 5   atorvastatin (LIPITOR) 10 MG tablet Take 1 tablet (10 mg total) by mouth daily. 90 tablet 3   b complex vitamins capsule Take 1 capsule by mouth daily.     Calcium Carbonate-Vitamin D 600-400 MG-UNIT tablet Take by mouth.     Cholecalciferol (VITAMIN D3 PO) Take 1 tablet by mouth daily.     diclofenac (FLECTOR) 1.3 % PTCH Place 1 patch onto the skin 2 (two) times daily. 60 patch 1   FLUZONE HIGH-DOSE QUADRIVALENT  0.7 ML SUSY      gabapentin (NEURONTIN) 300 MG capsule Take 2 capsules (600 mg total) by mouth 2 (two) times daily. 360 capsule 3   levothyroxine (SYNTHROID) 125 MCG tablet Take 1 tablet (125 mcg total) by mouth daily. 90 tablet 3   Milnacipran HCl (SAVELLA) 12.5 MG TABS Take 1 tablet (12.5 mg total) by mouth at bedtime. 30 tablet 3   Multiple Vitamin (MULTIVITAMIN) tablet Take 1 tablet by mouth daily.     Omega-3 Fatty Acids (FISH OIL PO) Take 1 tablet by mouth daily.     PFIZER-BIONT COVID-19 VAC-TRIS SUSP injection      tiZANidine (ZANAFLEX) 4 MG tablet Take 2 tablets (8 mg total) by mouth at bedtime as needed for muscle spasms. 60 tablet 5   topiramate (TOPAMAX) 25 MG tablet Take 1 tablet (25 mg total) by mouth at bedtime. 30 tablet 3   venlafaxine XR (EFFEXOR XR) 150 MG 24 hr capsule Take 1 capsule (150 mg total) by mouth daily with breakfast. 90 capsule 3   Allergies Allergies  Allergen Reactions   Other Hives and Itching    Peaches   Prunus Persica     Review of Systems: Constitutional:  no fevers Eye:  no recent significant change in  vision Ears:  No changes in hearing Nose/Mouth/Throat:  no complaints of nasal congestion, no sore throat Cardiovascular: no chest pain Respiratory:  No shortness of breath Gastrointestinal:  No change in bowel habits GU:  Female: negative for dysuria Integumentary: lump on her back Neurologic:  no headaches Endocrine:  denies unexplained weight changes  Exam BP 108/68   Pulse 84   Temp 98.3 F (36.8 C) (Oral)   Ht 5\' 6"  (1.676 m)   Wt 197 lb (89.4 kg)   SpO2 98%   BMI 31.80 kg/m  General:  well developed, well nourished, in no apparent distress Skin:  no significant moles, warts, or growths Head:  no masses, lesions, or tenderness Eyes:  pupils equal and round, sclera anicteric without injection Ears:  canals without lesions, TMs shiny without retraction, no obvious effusion, no erythema Nose:  nares patent, septum midline, mucosa  normal, and no drainage or sinus tenderness Throat/Pharynx:  lips and gingiva without lesion; tongue and uvula midline; non-inflamed pharynx; no exudates or postnasal drainage Neck: neck supple without adenopathy, thyromegaly, or masses Lungs:  clear to auscultation, breath sounds equal bilaterally, no respiratory distress Cardio:  regular rate and rhythm, no bruits or LE edema Abdomen:  abdomen soft, nontender; bowel sounds normal; no masses or organomegaly Genital: Deferred Neuro:  gait normal; deep tendon reflexes normal and symmetric MSK: R shoulder- decreased active/passive ROM, +Neer's, Hawkins; neg cross over, Speed's, lift off Psych: well oriented with normal range of affect and appropriate judgment/insight  Procedure Note; Shoulder joint injection Informed consent obtained. The area was palpated, an area was marked posterior to the acromion process approximately 2 cm inferiorly, and cleaned with alcohol x1. Topical freeze spray used for anesthesia.  A 25-gauge needle, while aiming towards the coracoid process, was used to enter the joint posteriorally with ease. 40 mg of Depomedrol with 2 mL of 1% lidocaine was injected. The patient tolerated the procedure well. There were no complications noted.  Assessment and Plan  Well adult exam  Hyperlipidemia, unspecified hyperlipidemia type - Plan: Comprehensive metabolic panel, Lipid panel  Adhesive capsulitis of right shoulder - Plan: methylPREDNISolone acetate (DEPO-MEDROL) injection 40 mg, PR DRAIN/INJECT LARGE JOINT/BURSA, DISCONTINUED: methylPREDNISolone acetate (DEPO-MEDROL) injection 40 mg  Hypothyroidism, unspecified type - Plan: T4, free, TSH  Need for vaccination against Streptococcus pneumoniae - Plan: Pneumococcal conjugate vaccine 20-valent (Prevnar 11)   Well 65 y.o. female. Counseled on diet and exercise. Other orders as above. Chronic, unstable. Stretches/exercises, Tylenol, ice, injection today, PT in 2-3 weeks if  no better.  Follow up in 6 mo or prn. The patient voiced understanding and agreement to the plan.  [place], DO 02/24/21 10:23 AM

## 2021-03-10 ENCOUNTER — Other Ambulatory Visit: Payer: Self-pay

## 2021-03-10 ENCOUNTER — Ambulatory Visit: Payer: Medicare Other | Admitting: Podiatry

## 2021-03-10 ENCOUNTER — Ambulatory Visit: Payer: Medicare Other

## 2021-03-10 DIAGNOSIS — M25872 Other specified joint disorders, left ankle and foot: Secondary | ICD-10-CM | POA: Diagnosis not present

## 2021-03-10 DIAGNOSIS — G9050 Complex regional pain syndrome I, unspecified: Secondary | ICD-10-CM

## 2021-03-10 DIAGNOSIS — M205X2 Other deformities of toe(s) (acquired), left foot: Secondary | ICD-10-CM | POA: Diagnosis not present

## 2021-03-10 DIAGNOSIS — M778 Other enthesopathies, not elsewhere classified: Secondary | ICD-10-CM

## 2021-03-10 DIAGNOSIS — M779 Enthesopathy, unspecified: Secondary | ICD-10-CM | POA: Diagnosis not present

## 2021-03-10 NOTE — Progress Notes (Signed)
SITUATION Reason for Consult: Follow-up with left foot orthosis Patient / Caregiver Report: Patient is not getting appropriate relief  OBJECTIVE DATA History / Diagnosis:    ICD-10-CM   1. Sesamoiditis of left foot  M25.872     2. Hallux limitus of left foot  M20.5X2     3. Capsulitis of foot, left  M77.8       Change in Pathology: Unchanged and without improvement  ACTIONS PERFORMED Patient's equipment was checked for structural stability and fit. Devices marked for modification and returned to manufacturers. All questions answered and concerns addressed.  PLAN Return for refit in one to two weeks. Plan of care discussed with and agreed upon by patient / caregiver.

## 2021-03-12 NOTE — Progress Notes (Signed)
Subjective: Elaine Buck is a 65 y.o. is seen today in office for follow-up evaluation of foot pain, nerve issues, CRPS.  She is scheduled for a sympathetic block on December 29.  She has had a Qutenza treatment which did not help much.  She is on medications as prescribed by pain management.  She also describes discomfort, pain submetatarsal 1 and 2 mostly.  No recent injury or changes.  Objective: General: No acute distress, AAOx3  DP/PT pulses palpable 2/4, CRT < 3 sec to all digits.  Protective sensation intact. Motor function intact.  LEFT foot: Incision is well coapted without any evidence of dehiscence and a scar is formed.  Hallux varus is present on the left side.  Majority tenderness is plantar aspect metatarsal 1 and 2.  She has numbness to the foot.  There is decreased temperature on the left side compared to the right side. MMT 5/5 No other open lesions or pre-ulcerative lesions.  No pain with calf compression, swelling, warmth, erythema.   Assessment and Plan:  Left hallux varus, neuropathy-concern for CRPS   -Treatment options discussed including all alternatives, risks, and complications -Again had a long discussion regards to her symptoms.  Do think part of the issue is the pain to her feet.  We will hold off anything invasive at this point given the concern for CRPS.  I had our orthotist, Guadlupe Spanish evaluate the patient further modify the orthotics.  They were sent back to the lab for modifications. -Regards to the nerve symptoms she is going to try another Qutenza patch in a couple months.  Scheduled for sympathetic block next week.  After discussion with her today regards to the nerve pain to her foot she does state that she has L4-L5 issues in her back many years ago.  She may need to get an MRI or further imaging of her back to see if that contributed to the foot nerve symptoms as well.  Return in about 2 months (around 05/11/2021).  [name]

## 2021-03-20 ENCOUNTER — Encounter: Payer: Self-pay | Admitting: Podiatry

## 2021-03-21 DIAGNOSIS — G90522 Complex regional pain syndrome I of left lower limb: Secondary | ICD-10-CM | POA: Diagnosis not present

## 2021-03-28 ENCOUNTER — Telehealth: Payer: Self-pay | Admitting: Podiatry

## 2021-03-28 NOTE — Telephone Encounter (Signed)
Left orthotic in... lvm for pt to call to schedule an appt to pick it up with Aaron Edelman.

## 2021-03-31 DIAGNOSIS — M6281 Muscle weakness (generalized): Secondary | ICD-10-CM | POA: Diagnosis not present

## 2021-03-31 DIAGNOSIS — M25675 Stiffness of left foot, not elsewhere classified: Secondary | ICD-10-CM | POA: Diagnosis not present

## 2021-03-31 DIAGNOSIS — M25572 Pain in left ankle and joints of left foot: Secondary | ICD-10-CM | POA: Diagnosis not present

## 2021-04-03 ENCOUNTER — Ambulatory Visit: Payer: Medicare Other | Admitting: Physical Medicine and Rehabilitation

## 2021-04-03 DIAGNOSIS — M25675 Stiffness of left foot, not elsewhere classified: Secondary | ICD-10-CM | POA: Diagnosis not present

## 2021-04-03 DIAGNOSIS — M25572 Pain in left ankle and joints of left foot: Secondary | ICD-10-CM | POA: Diagnosis not present

## 2021-04-03 DIAGNOSIS — M6281 Muscle weakness (generalized): Secondary | ICD-10-CM | POA: Diagnosis not present

## 2021-04-05 ENCOUNTER — Encounter: Payer: Self-pay | Admitting: Podiatry

## 2021-04-08 ENCOUNTER — Telehealth: Payer: Self-pay | Admitting: Podiatry

## 2021-04-08 DIAGNOSIS — M25675 Stiffness of left foot, not elsewhere classified: Secondary | ICD-10-CM | POA: Diagnosis not present

## 2021-04-08 DIAGNOSIS — M25572 Pain in left ankle and joints of left foot: Secondary | ICD-10-CM | POA: Diagnosis not present

## 2021-04-08 DIAGNOSIS — M6281 Muscle weakness (generalized): Secondary | ICD-10-CM | POA: Diagnosis not present

## 2021-04-08 NOTE — Telephone Encounter (Signed)
Pt left message asking if orthotic was in yet.  I returned call and left message that it is in and to call to schedule an appt to pick it up..  I left message on 1.6 previously.

## 2021-04-09 ENCOUNTER — Other Ambulatory Visit: Payer: Self-pay | Admitting: Neurology

## 2021-04-14 ENCOUNTER — Telehealth: Payer: Self-pay | Admitting: Podiatry

## 2021-04-14 ENCOUNTER — Ambulatory Visit: Payer: Medicare Other | Admitting: Family Medicine

## 2021-04-14 NOTE — Telephone Encounter (Signed)
Pt left message stating she could not make her appt for today because she is out of town her flt to come home last night got canceled.She was wanting to come on 1.30 @ lunch time as she has an appt in the area that morning.   Upon checking it was already rescheduled.  I did call and leave a message for pt that I seen it was already rescheduled but to call if it did not work for her.

## 2021-04-17 DIAGNOSIS — M6281 Muscle weakness (generalized): Secondary | ICD-10-CM | POA: Diagnosis not present

## 2021-04-17 DIAGNOSIS — M25675 Stiffness of left foot, not elsewhere classified: Secondary | ICD-10-CM | POA: Diagnosis not present

## 2021-04-17 DIAGNOSIS — M25572 Pain in left ankle and joints of left foot: Secondary | ICD-10-CM | POA: Diagnosis not present

## 2021-04-21 ENCOUNTER — Encounter: Payer: Self-pay | Admitting: Family Medicine

## 2021-04-21 ENCOUNTER — Ambulatory Visit: Payer: Medicare Other

## 2021-04-21 ENCOUNTER — Ambulatory Visit: Payer: Self-pay

## 2021-04-21 ENCOUNTER — Ambulatory Visit: Payer: Medicare Other | Admitting: Family Medicine

## 2021-04-21 ENCOUNTER — Other Ambulatory Visit: Payer: Self-pay

## 2021-04-21 VITALS — BP 132/86 | Ht 66.0 in | Wt 190.0 lb

## 2021-04-21 DIAGNOSIS — Z9889 Other specified postprocedural states: Secondary | ICD-10-CM

## 2021-04-21 DIAGNOSIS — M205X2 Other deformities of toe(s) (acquired), left foot: Secondary | ICD-10-CM

## 2021-04-21 DIAGNOSIS — M778 Other enthesopathies, not elsewhere classified: Secondary | ICD-10-CM

## 2021-04-21 DIAGNOSIS — M2041 Other hammer toe(s) (acquired), right foot: Secondary | ICD-10-CM

## 2021-04-21 DIAGNOSIS — M25511 Pain in right shoulder: Secondary | ICD-10-CM

## 2021-04-21 DIAGNOSIS — M779 Enthesopathy, unspecified: Secondary | ICD-10-CM

## 2021-04-21 DIAGNOSIS — M2042 Other hammer toe(s) (acquired), left foot: Secondary | ICD-10-CM

## 2021-04-21 DIAGNOSIS — M21611 Bunion of right foot: Secondary | ICD-10-CM

## 2021-04-21 DIAGNOSIS — M21962 Unspecified acquired deformity of left lower leg: Secondary | ICD-10-CM

## 2021-04-21 DIAGNOSIS — G629 Polyneuropathy, unspecified: Secondary | ICD-10-CM

## 2021-04-21 NOTE — Progress Notes (Signed)
SITUATION Reason for Consult: Follow-up with left foot orthotic Patient / Caregiver Report: Patient is satisfied with modifications  OBJECTIVE DATA History / Diagnosis:    ICD-10-CM   1. Hallux limitus of left foot  M20.5X2     2. Capsulitis of foot, left  M77.8     3. Capsulitis  M77.9     4. Neuropathy  G62.9     5. Bunion, right  M21.611     6. Hammertoe of right foot  M20.41     7. Metatarsal deformity, left  M21.962     8. Hammer toe of left foot  M20.42     9. Status post left foot surgery  Z98.890       Change in Pathology: None  ACTIONS PERFORMED Patient's equipment was checked for structural stability and fit. Device was refit after modifications performed at [facility]. Device(s) intact and fit is excellent. All questions answered and concerns addressed.  PLAN Follow-up as needed (PRN). Plan of care discussed with and agreed upon by patient / caregiver.

## 2021-04-21 NOTE — Progress Notes (Signed)
° ° °  SUBJECTIVE:   CHIEF COMPLAINT / HPI: right shoulder pain  Pain worsening since Aug 2022.  Thinks happened when playing pool Volleyball and reached out to hit ball.  Did not hear any popping sensation at that time.  No swelling or bruising.  Seen by PCP and was given exercises which has not helped.  Received glenohumeral injection but stated that it did nothing to help with pain.  Pain is a burning sensation on the right deltoid area only.  Wakes her up at night. Denies any fevers, numbness, tingling or weakness of extremities. Has not taken any OTC medications for pain.  PERTINENT  PMH / PSH:   OBJECTIVE:   BP 132/86    Ht 5\' 6"  (1.676 m)    Wt 190 lb (86.2 kg)    BMI 30.67 kg/m    General: Alert, no acute distress Right Shoulder: Inspection reveals no obvious deformity, atrophy, or asymmetry. No bruising. No swelling Palpation is normal with no TTP over [facility] joint or bicipital groove. Limited passive and active ROM in flexion, abduction, internal/external rotation.  ER to 45 degrees compared to 70 degrees on left. NV intact distally Normal scapular function observed. Special Tests:  - Impingement: Positive Hawkins, neers. - Supraspinatus: Negative empty can.  5/5 strength with resisted flexion at 20 degrees - Infraspinatus/Teres Minor: 5/5 strength with ER - Subscapularis: negative belly press, negative bear hug. 5/5 strength with IR - Biceps tendon: Negative Speeds, Yerrgason's  - Labrum: Positive Obriens, good stability - AC Joint: Positive cross arm - Negative apprehension test - Painful arc and no drop arm sign   Limited MSK u/s right shoulder: Supraspinatus, infraspinatus, and subscapularis without visible tears.  Small calcification within supraspinatus   ASSESSMENT/PLAN:   Shoulder pain Suspect unresolved adhesive capsulitis.  -MRI right shoulder -Continue home exercises for now     Carollee Leitz, MD [place]

## 2021-04-21 NOTE — Patient Instructions (Addendum)
We will go ahead with an MRI of your shoulder. I will call you with the results and next steps. Ok to continue with home motion exercises for the time being - the MRI will dictate where we go with further treatment.   [place] at [facility] [place],  [place]  [id number] Main: [phone]  Sunday:Closed Monday:7:30 AM - 5:30 PM Tuesday:7:30 AM - 5:30 PM Wednesday:7:30 AM - 5:30 PM Thursday:7:30 AM - 5:30 PM Friday:7:30 AM - 5:30 PM Saturday:8:00 AM - 5:00 PM Saturday hours limited to open MRI.  Please call to schedule your MRI.

## 2021-04-21 NOTE — Assessment & Plan Note (Signed)
Suspect unresolved adhesive capsulitis.  -MRI right shoulder -Continue home exercises for now

## 2021-04-22 ENCOUNTER — Encounter: Payer: Self-pay | Admitting: Family Medicine

## 2021-04-22 MED ORDER — TIZANIDINE HCL 4 MG PO TABS: 8.0000 mg | ORAL_TABLET | Freq: Every evening | ORAL | 5 refills | Status: DC | PRN

## 2021-04-22 MED ORDER — VENLAFAXINE HCL ER 150 MG PO CP24: 150.0000 mg | ORAL_CAPSULE | Freq: Every day | ORAL | 1 refills | Status: DC

## 2021-04-24 DIAGNOSIS — M25675 Stiffness of left foot, not elsewhere classified: Secondary | ICD-10-CM | POA: Diagnosis not present

## 2021-04-24 DIAGNOSIS — M6281 Muscle weakness (generalized): Secondary | ICD-10-CM | POA: Diagnosis not present

## 2021-04-24 DIAGNOSIS — M25572 Pain in left ankle and joints of left foot: Secondary | ICD-10-CM | POA: Diagnosis not present

## 2021-04-25 DIAGNOSIS — G90522 Complex regional pain syndrome I of left lower limb: Secondary | ICD-10-CM | POA: Diagnosis not present

## 2021-04-26 ENCOUNTER — Other Ambulatory Visit: Payer: Self-pay

## 2021-04-26 ENCOUNTER — Ambulatory Visit (HOSPITAL_BASED_OUTPATIENT_CLINIC_OR_DEPARTMENT_OTHER)
Admission: RE | Admit: 2021-04-26 | Discharge: 2021-04-26 | Disposition: A | Discharge status: Home / Self Care | Payer: Medicare Other | Source: Ambulatory Visit | Attending: Family Medicine | Admitting: Family Medicine

## 2021-04-26 DIAGNOSIS — M25511 Pain in right shoulder: Secondary | ICD-10-CM | POA: Insufficient documentation

## 2021-04-26 IMAGING — MR MR SHOULDER*R* W/O CM
5 series · 40 of 40 positions shown · non-contrast
Comparison: None.

CLINICAL DATA: Right shoulder pain since [DATE].

EXAM:
MRI OF THE RIGHT SHOULDER WITHOUT CONTRAST
TECHNIQUE: Multiplanar, multisequence MR imaging of the shoulder was performed.
No intravenous contrast was administered.

[Series 4: T2 fat-sat · oblique · 4.0mm · 0.59mm/px · 7 of 18 slices shown (1 of 3)]
[im 1/18]
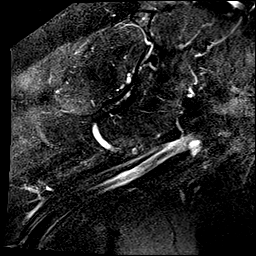
[im 3/18]
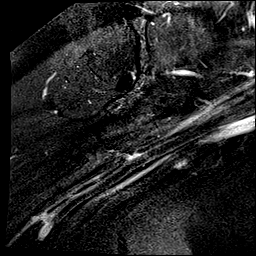
[im 6/18]
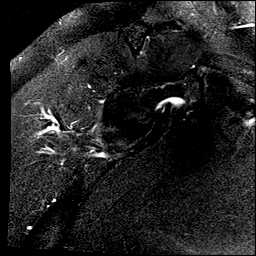
[im 9/18]
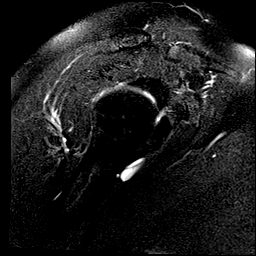
[im 12/18]
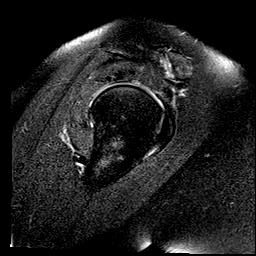
[im 15/18]
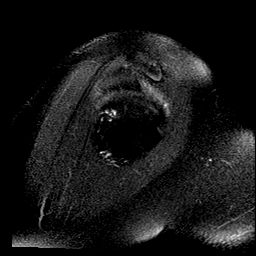
[im 18/18]
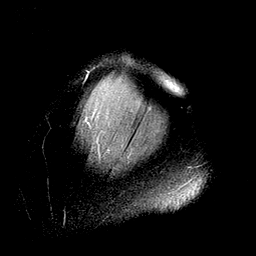

[Series 5: T2 fat-sat · oblique · 4.0mm · 0.59mm/px · 8 of 18 slices shown (2 of 3)]
[im 1/18]
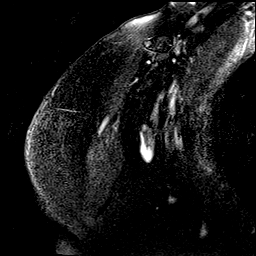
[im 3/18]
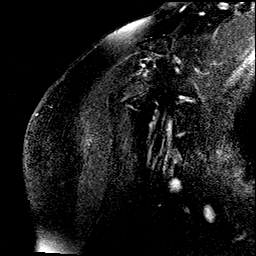
[im 5/18]
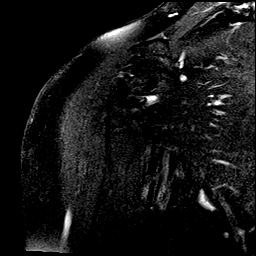
[im 8/18]
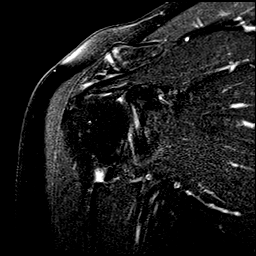
[im 10/18]
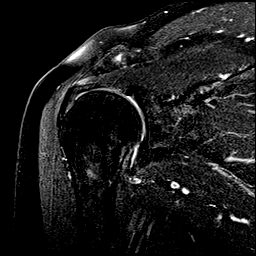
[im 13/18]
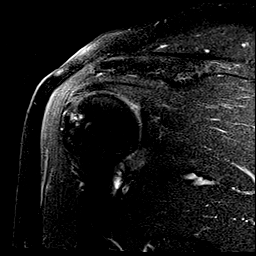
[im 15/18]
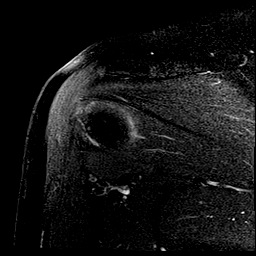
[im 18/18]
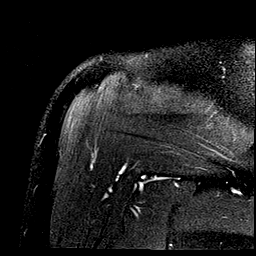

[Series 6: PD · oblique · 4.0mm · 0.59mm/px · 8 of 18 slices shown]
[im 1/18]
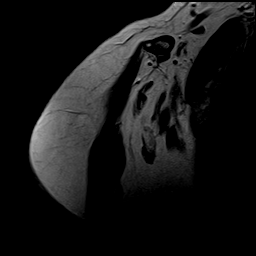
[im 3/18]
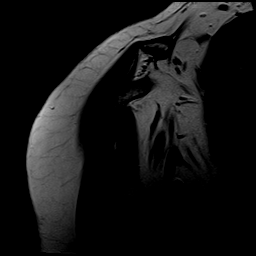
[im 5/18]
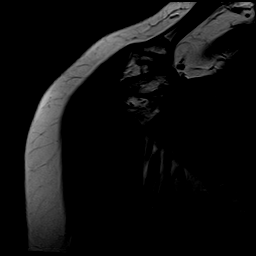
[im 8/18]
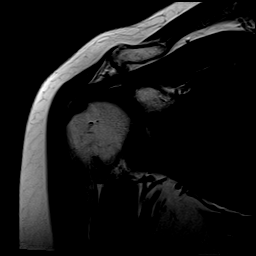
[im 10/18]
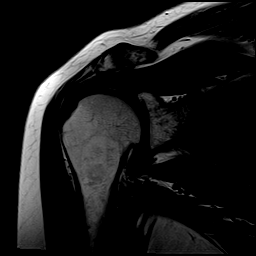
[im 13/18]
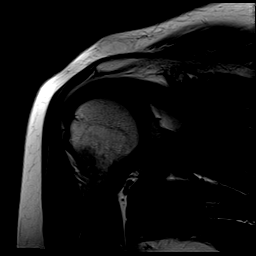
[im 15/18]
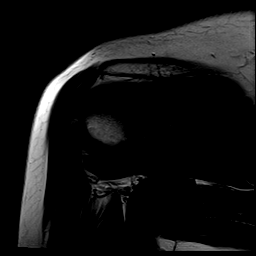
[im 18/18]
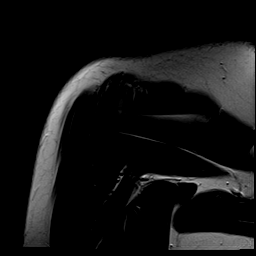

[Series 7: T1 · oblique · 4.0mm · 0.59mm/px · 8 of 18 slices shown]
[im 1/18]
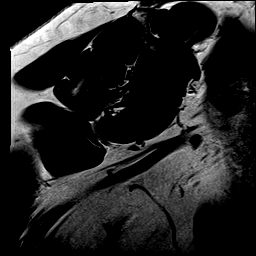
[im 3/18]
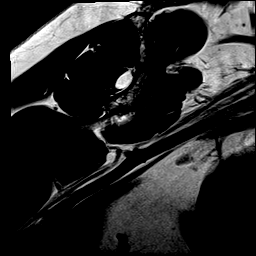
[im 5/18]
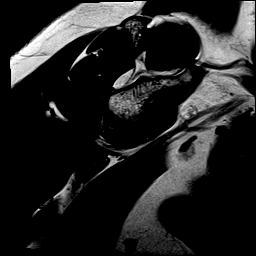
[im 8/18]
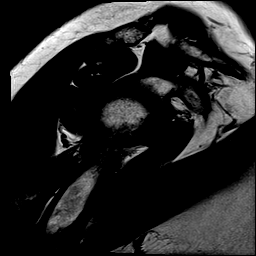
[im 10/18]
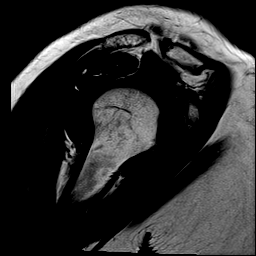
[im 13/18]
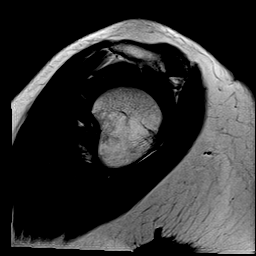
[im 15/18]
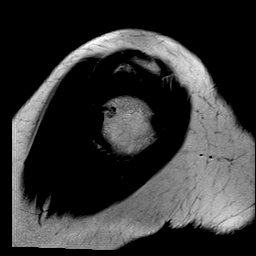
[im 18/18]
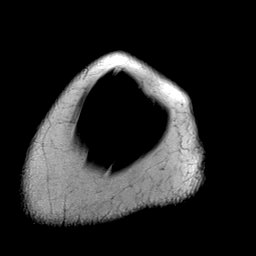

[Series 8: T2 fat-sat · axial · 4.0mm · 0.62mm/px · z∈[-32,+59]mm · 9 of 20 slices shown (3 of 3)]
[im 1/20]
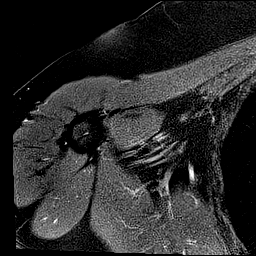
[im 3/20]
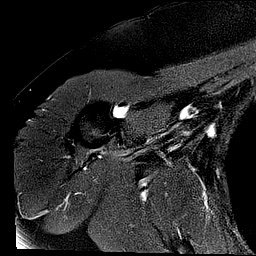
[im 5/20]
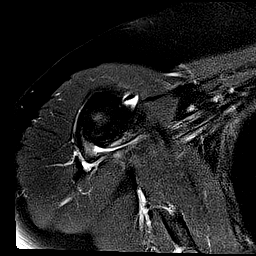
[im 8/20]
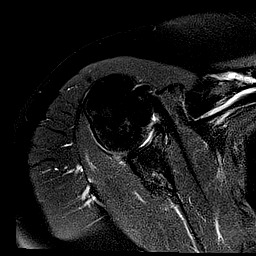
[im 10/20]
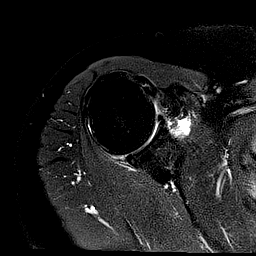
[im 12/20]
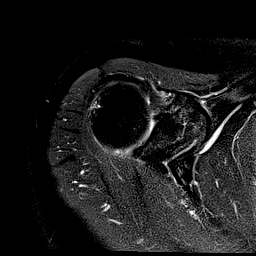
[im 15/20]
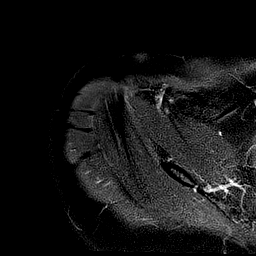
[im 17/20]
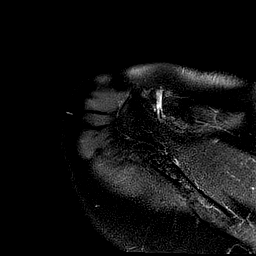
[im 20/20]
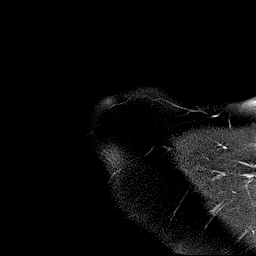

[40 of 40 positions shown; findings below may reference images not displayed]

FINDINGS: Rotator cuff: Mild tendinosis of the supraspinatus tendon with a
tiny insertional interstitial tear anteriorly. Mild tendinosis of
the infraspinatus tendon. Teres minor tendon is intact.
Subscapularis tendon is intact.

Muscles: No muscle atrophy or edema. No intramuscular fluid
collection or hematoma.

Biceps Long Head: Intraarticular and extraarticular portions of the
biceps tendon are intact.

Acromioclavicular Joint: Moderate arthropathy of the
acromioclavicular joint. No subacromial/subdeltoid bursal fluid.

Glenohumeral Joint: No joint effusion. No chondral defect.

Labrum: Posterosuperior labral tear.

Bones: No fracture or dislocation. No aggressive osseous lesion.

Other: No fluid collection or hematoma.
IMPRESSION: 1. Mild tendinosis of the supraspinatus tendon with a tiny
insertional interstitial tear anteriorly.
2. Mild tendinosis of the infraspinatus tendon.
3. Posterosuperior labral tear.

## 2021-04-28 ENCOUNTER — Ambulatory Visit: Payer: Medicare Other | Admitting: Physical Medicine and Rehabilitation

## 2021-04-28 ENCOUNTER — Telehealth: Payer: Self-pay | Admitting: Family Medicine

## 2021-04-28 ENCOUNTER — Other Ambulatory Visit: Payer: Self-pay | Admitting: Family Medicine

## 2021-04-28 DIAGNOSIS — M6281 Muscle weakness (generalized): Secondary | ICD-10-CM | POA: Diagnosis not present

## 2021-04-28 DIAGNOSIS — M25675 Stiffness of left foot, not elsewhere classified: Secondary | ICD-10-CM | POA: Diagnosis not present

## 2021-04-28 DIAGNOSIS — M25572 Pain in left ankle and joints of left foot: Secondary | ICD-10-CM | POA: Diagnosis not present

## 2021-04-28 MED ORDER — MELOXICAM 15 MG PO TABS: 15.0000 mg | ORAL_TABLET | Freq: Every day | ORAL | 0 refills | Status: DC

## 2021-04-28 NOTE — Telephone Encounter (Signed)
Patient stated her right side of her body is in pain from sinus infection.  Appt sched. Patient would like to know if meds can be prescribed prior

## 2021-04-28 NOTE — Telephone Encounter (Signed)
Patient informed. 

## 2021-04-29 ENCOUNTER — Other Ambulatory Visit: Payer: Self-pay

## 2021-04-29 ENCOUNTER — Encounter: Payer: Self-pay | Admitting: Family Medicine

## 2021-04-29 ENCOUNTER — Ambulatory Visit (INDEPENDENT_AMBULATORY_CARE_PROVIDER_SITE_OTHER): Payer: Medicare Other | Admitting: Family Medicine

## 2021-04-29 ENCOUNTER — Encounter
Payer: Medicare Other | Attending: Physical Medicine and Rehabilitation | Admitting: Physical Medicine and Rehabilitation

## 2021-04-29 VITALS — BP 130/70 | HR 79 | Temp 97.8°F | Ht 66.0 in | Wt 202.2 lb

## 2021-04-29 VITALS — BP 122/84 | HR 84 | Ht 66.0 in | Wt 203.0 lb

## 2021-04-29 DIAGNOSIS — J0100 Acute maxillary sinusitis, unspecified: Secondary | ICD-10-CM

## 2021-04-29 DIAGNOSIS — G4701 Insomnia due to medical condition: Secondary | ICD-10-CM | POA: Diagnosis not present

## 2021-04-29 DIAGNOSIS — G90522 Complex regional pain syndrome I of left lower limb: Secondary | ICD-10-CM | POA: Diagnosis not present

## 2021-04-29 MED ORDER — AMOXICILLIN-POT CLAVULANATE 875-125 MG PO TABS: 1.0000 | ORAL_TABLET | Freq: Two times a day (BID) | ORAL | 0 refills | Status: AC

## 2021-04-29 MED ORDER — GABAPENTIN 300 MG PO CAPS
600.0000 mg | ORAL_CAPSULE | Freq: Two times a day (BID) | ORAL | 3 refills | Status: DC
Start: 2021-04-29 — End: 2021-09-15

## 2021-04-29 MED ORDER — PREDNISONE 20 MG PO TABS: 40.0000 mg | ORAL_TABLET | Freq: Every day | ORAL | 0 refills | Status: AC

## 2021-04-29 MED ORDER — TOPIRAMATE 25 MG PO TABS: 50.0000 mg | ORAL_TABLET | Freq: Every evening | ORAL | 3 refills | Status: DC

## 2021-04-29 NOTE — Progress Notes (Signed)
Chief Complaint  Patient presents with   Sinusitis    Elaine Buck here for URI complaints.  Duration: 2 days  Associated symptoms: sinus pain, itchy watery eyes, ear fullness, and dental pain Denies: sinus congestion, rhinorrhea, ear pain, ear drainage, sore throat, wheezing, shortness of breath, myalgia, and fevers, N/V/D Treatment to date: Tylenol Sick contacts: No  Past Medical History:  Diagnosis Date   Anxiety    Arthritis    Cancer ([place])    breast (Left)   History of colon polyps    History of lobular carcinoma in situ (LCIS) of  left breast    s/p lumpectomy in 2005, Tamoxifen/Evista therapy for 5 years   Hyperlipidemia    Neuromuscular disorder ([facility])    small fiber neuropathy, L foot    Thyroid disease    Objective BP 130/70    Pulse 79    Temp 97.8 F (36.6 C) (Oral)    Ht 5\' 6"  (1.676 m)    Wt 202 lb 4 oz (91.7 kg)    SpO2 99%    BMI 32.64 kg/m  General: Awake, alert, appears stated age HEENT: AT, [place], ears patent b/l and TM's neg, nares patent w/o discharge, pharynx pink and without exudates, MMM, ttp over R max and frontal sinus Neck: No masses or asymmetry Heart: RRR Lungs: CTAB, no accessory muscle use Psych: Age appropriate judgment and insight, normal mood and affect  Acute non-recurrent maxillary sinusitis - Plan: predniSONE (DELTASONE) 20 MG tablet, amoxicillin-clavulanate (AUGMENTIN) 875-125 MG tablet  5 d pred burst 40 mg/d. Augmentin if no better in 2 d given dental pain. Continue to push fluids, practice good hand hygiene, cover mouth when coughing. F/u prn. If starting to experience fevers, shaking, or shortness of breath, seek immediate care. Pt voiced understanding and agreement to the plan.  [place], DO 04/29/21 8:08 AM

## 2021-04-29 NOTE — Progress Notes (Signed)
Subjective:    Patient ID: Elaine Buck, female    DOB: 1956/03/20, 66 y.o.   MRN: [id number]  HPI   Elaine Buck is a 66 year old woman who presents for follow-up of bilateral foot pain.  -she has tightness in both feet -she had 1 week of benefit from first nerve block, and a little shorter from her second -Dr. Rosanna Randy is planning to do a series of 3 weekly injections to see if this provides benefit and if she would be a good candidate for an ablation -no benefit from [place] -she would like to try for a third and final time -pain is 95% on the left foot, 5% in the right foot -she has had surgery on her left foot -she has had 4 surgeries in the same area- first was in 2020.  -she has tried Gabapentin and Nortriptyline- niether of which helped. She has also failed lyrica. She has not tried [place] and is willing to.  -Pain is worst at night- she has a hard time falling asleep -touching the sheets causes exquisite pain -lot of cramping in left leg. -her family doctor put her on muscle relaxers which does help.  -Leg cramps have worsened after surgery.  She has been diagnosed bu her podiatrist with CRPS -she  -she had no benefits with Amitriptyline 25mg  and also experienced severe dry mouth so she stopped the medication.  -she had no benefit from [place] but would like to try again -notes that cold weather worsens her pain  Insomnia:  -she found that the [facility] was very helpful for sleep, but cost prohibitive. It was not covered by insurance so she would not like to continue this.  -prefers not to take steroids if they will worsen her feet  Pain Inventory Average Pain 7 Pain Right Now 7 My pain is constant, burning, aching, and tight feeling  In the last 24 hours, has pain interfered with the following? General activity 7 Relation with others 0 Enjoyment of life 4 What TIME of day is your pain at its worst? morning , daytime, evening, and night Sleep (in general)  Fair  Pain is worse with: walking, standing, and leg raised Pain improves with:  nothing Relief from Meds: 0    "walk a mile"  retired  numbness trouble walking spasms  Any changes since last visit?  no  Dr Celesta Gentile referral MD (podiarist)    Family History  Problem Relation Age of Onset   Stroke Mother    Bipolar disorder Son    ADD / ADHD Son    Anxiety disorder Son    Colon cancer Neg Hx    Esophageal cancer Neg Hx    Stomach cancer Neg Hx    Rectal cancer Neg Hx    Social History   Socioeconomic History   Marital status: Married    Spouse name: Not on file   Number of children: 2   Years of education: Not on file   Highest education level: Some college, no degree  Occupational History   Occupation: retired  Tobacco Use   Smoking status: Every Day    Packs/day: 0.80    Years: 20.00    Pack years: 16.00    Types: Cigarettes   Smokeless tobacco: Never   Tobacco comments:    Declines quitting for now  Vaping Use   Vaping Use: Never used  Substance and Sexual Activity   Alcohol use: Yes    Comment: Occasionlly   Drug use:  No   Sexual activity: Yes    Partners: Male  Other Topics Concern   Not on file  Social History Narrative   Left Handed   Lives in a one story home   Drinks one cup of coffee in the morning and Decaf in the evening    Social Determinants of Health   Financial Resource Strain: Not on file  Food Insecurity: Not on file  Transportation Needs: Not on file  Physical Activity: Not on file  Stress: Not on file  Social Connections: Not on file   Past Surgical History:  Procedure Laterality Date   BREAST BIOPSY     BREAST LUMPECTOMY Left 05/22/2003   LCIS Cells found in breast tissue   FOOT SURGERY Left 04/2019   joint inplantment in big toe    TUBAL LIGATION     Past Medical History:  Diagnosis Date   Anxiety    Arthritis    Cancer ([place])    breast (Left)   History of colon polyps    History of lobular carcinoma  in situ (LCIS) of  left breast    s/p lumpectomy in 2005, Tamoxifen/Evista therapy for 5 years   Hyperlipidemia    Neuromuscular disorder ([place])    small fiber neuropathy, L foot    Thyroid disease    BP 122/84    Pulse 84    Ht 5\' 6"  (1.676 m)    Wt 203 lb (92.1 kg)    SpO2 94%    BMI 32.77 kg/m   Opioid Risk Score:   Fall Risk Score:  `1  Depression screen PHQ 2/9  Depression screen [facility] 2/9 04/29/2021 02/24/2021 01/07/2021 12/24/2020 10/21/2016  Decreased Interest 0 0 0 0 0  Down, Depressed, Hopeless 0 0 0 0 0  PHQ - 2 Score 0 0 0 0 0  Altered sleeping - - - 0 0  Tired, decreased energy - - - 0 0  Change in appetite - - - 1 0  Feeling bad or failure about yourself  - - - 0 0  Trouble concentrating - - - 0 0  Moving slowly or fidgety/restless - - - 0 0  Suicidal thoughts - - - 0 0  PHQ-9 Score - - - 1 0     Review of Systems  Constitutional:  Positive for unexpected weight change.       Gain  HENT: Negative.    Eyes: Negative.   Respiratory: Negative.    Cardiovascular: Negative.   Gastrointestinal: Negative.   Endocrine: Negative.   Genitourinary: Negative.   Musculoskeletal:  Positive for gait problem.       Spasms  Skin: Negative.   Allergic/Immunologic: Negative.   Neurological:  Positive for numbness.       Burning  Hematological: Negative.   Psychiatric/Behavioral: Negative.    All other systems reviewed and are negative.     Objective:   Physical Exam Gen: no distress, normal appearing HEENT: oral mucosa pink and moist, NCAT Cardio: Reg rate Chest: normal effort, normal rate of breathing Abd: soft, non-distended Ext: no edema Psych: pleasant, normal affect Skin: intact Neuro: Alert and oriented x3, exquisite sensitivity to left foot    Assessment & Plan:   1) Chronic Pain Syndrome secondary to CRPS of left foot -Discussed current symptoms of pain and history of pain.  -Discussed benefits of exercise in reducing pain. -Discussed Qutenza as an option  for neuropathic pain control. Discussed that this is a capsaicin patch, stronger than capsaicin  cream. Discussed that it is currently approved for diabetic peripheral neuropathy and post-herpetic neuralgia, but that it has also shown benefit in treating other forms of neuropathy. Provided patient with link to site to learn more about the patch: CinemaBonus.fr. Discussed that the patch would be placed in office and benefits usually last 3 months. Discussed that unintended exposure to capsaicin can cause severe irritation of eyes, mucous membranes, respiratory tract, and skin, but that Qutenza is a local treatment and does not have the systemic side effects of other nerve medications. Discussed that there may be pain, itching, erythema, and decreased sensory function associated with the application of Qutenza. Side effects usually subside within 1 week. A cold pack of analgesic medications can help with these side effects. Blood pressure can also be increased due to pain associated with administration of the patch.  2 patches of Qutenza was applied to the area of pain. Ice packs were applied during the procedure to ensure patient comfort. Blood pressure was monitored every 15 minutes. The patient tolerated the procedure well. Post-procedure instructions were given and follow-up has been scheduled.  .    -referred for aquatherapy in [place].  -referred for sympathetic nerve block with [place] [facility]- says she has not yet heard from them. Discussed how sympathetic nerve block can be diagnostic and therapeutic for CRPS -stop amitriptyline given side effect of dry mouth/lack of efficayc.  -recommended starting NAC 600mg  BID -refilled gabapentin and topamax -Discussed following foods that may reduce pain: 1) Ginger (especially studied for arthritis)- reduce leukotriene production to decrease inflammation 2) Blueberries- high in phytonutrients that decrease inflammation 3) Salmon- marine omega-3s  reduce joint swelling and pain 4) Pumpkin seeds- reduce inflammation 5) dark chocolate- reduces inflammation 6) turmeric- reduces inflammation 7) tart cherries - reduce pain and stiffness 8) extra virgin olive oil - its compound olecanthal helps to block prostaglandins  9) chili peppers- can be eaten or applied topically via capsaicin 10) mint- helpful for headache, muscle aches, joint pain, and itching 11) garlic- reduces inflammation  Link to further information on diet for chronic pain: http://www.randall.com/   2) Muscle cramps: -continue muscle spasm medication -try foods high in potassium and magnesium  3) insomnia secondary to pain D/c savella since too expensive Increase topamax to 50mg  HS -discussed regarding symptoms of serotonin syndrome to look out for given concurrent Effexor use.

## 2021-04-29 NOTE — Patient Instructions (Addendum)
Continue to push fluids, practice good hand hygiene, and cover your mouth if you cough.  If you start having fevers, shaking or shortness of breath, seek immediate care.  OK to take Tylenol 1000 mg (2 extra strength tabs) or 975 mg (3 regular strength tabs) every 6 hours as needed.  Wait 2 days before taking the antibiotic.   Let us know if you need anything.

## 2021-04-30 DIAGNOSIS — M25675 Stiffness of left foot, not elsewhere classified: Secondary | ICD-10-CM | POA: Diagnosis not present

## 2021-04-30 DIAGNOSIS — M6281 Muscle weakness (generalized): Secondary | ICD-10-CM | POA: Diagnosis not present

## 2021-04-30 DIAGNOSIS — M25572 Pain in left ankle and joints of left foot: Secondary | ICD-10-CM | POA: Diagnosis not present

## 2021-05-05 DIAGNOSIS — M6281 Muscle weakness (generalized): Secondary | ICD-10-CM | POA: Diagnosis not present

## 2021-05-05 DIAGNOSIS — M25572 Pain in left ankle and joints of left foot: Secondary | ICD-10-CM | POA: Diagnosis not present

## 2021-05-05 DIAGNOSIS — M25675 Stiffness of left foot, not elsewhere classified: Secondary | ICD-10-CM | POA: Diagnosis not present

## 2021-05-08 ENCOUNTER — Encounter: Payer: Self-pay | Admitting: Family Medicine

## 2021-05-08 ENCOUNTER — Ambulatory Visit: Payer: Self-pay

## 2021-05-08 ENCOUNTER — Ambulatory Visit: Payer: Medicare Other | Admitting: Family Medicine

## 2021-05-08 VITALS — Ht 66.0 in | Wt 190.0 lb

## 2021-05-08 DIAGNOSIS — M25511 Pain in right shoulder: Secondary | ICD-10-CM | POA: Diagnosis not present

## 2021-05-08 MED ORDER — METHYLPREDNISOLONE ACETATE 40 MG/ML IJ SUSP
40.0000 mg | Freq: Once | INTRAMUSCULAR | Status: AC
Start: 2021-05-08 — End: 2021-05-08
  Administered 2021-05-08: 40 mg via INTRA_ARTICULAR

## 2021-05-08 MED ORDER — METHYLPREDNISOLONE ACETATE 40 MG/ML IJ SUSP
40.0000 mg | Freq: Once | INTRAMUSCULAR | Status: AC
  Administered 2021-05-08: 40 mg via INTRA_ARTICULAR

## 2021-05-09 MED ORDER — TRAMADOL HCL 50 MG PO TABS: 50.0000 mg | ORAL_TABLET | Freq: Four times a day (QID) | ORAL | 0 refills | Status: DC | PRN

## 2021-05-12 ENCOUNTER — Ambulatory Visit: Payer: Medicare Other

## 2021-05-12 ENCOUNTER — Encounter: Payer: Self-pay | Admitting: Podiatry

## 2021-05-12 ENCOUNTER — Ambulatory Visit: Payer: Medicare Other | Admitting: Podiatry

## 2021-05-12 ENCOUNTER — Other Ambulatory Visit: Payer: Self-pay

## 2021-05-12 DIAGNOSIS — G9050 Complex regional pain syndrome I, unspecified: Secondary | ICD-10-CM

## 2021-05-12 DIAGNOSIS — M778 Other enthesopathies, not elsewhere classified: Secondary | ICD-10-CM

## 2021-05-12 DIAGNOSIS — M205X2 Other deformities of toe(s) (acquired), left foot: Secondary | ICD-10-CM | POA: Diagnosis not present

## 2021-05-12 DIAGNOSIS — M779 Enthesopathy, unspecified: Secondary | ICD-10-CM

## 2021-05-12 DIAGNOSIS — M25872 Other specified joint disorders, left ankle and foot: Secondary | ICD-10-CM

## 2021-05-12 NOTE — Progress Notes (Signed)
SITUATION Reason for Consult: Follow-up with foot orthotics Patient / Caregiver Report: Patient needs additional offloading  OBJECTIVE DATA History / Diagnosis:    ICD-10-CM   1. Capsulitis  M77.9     2. Sesamoiditis of left foot  M25.872       Change in Pathology: None  ACTIONS PERFORMED Patient's equipment was checked for structural stability and fit. Removed material under met heads to increase offloading. Device(s) intact and fit is excellent. All questions answered and concerns addressed.  PLAN Follow-up as needed (PRN). Plan of care discussed with and agreed upon by patient / caregiver.

## 2021-05-12 NOTE — Progress Notes (Signed)
SITUATION Reason for Consult: Follow-up with custom foot orthotics Patient / Caregiver Report: Patient needs more offloading for her left met heads  OBJECTIVE DATA History / Diagnosis:    ICD-10-CM   1. Capsulitis of foot, left  M77.8       Change in Pathology: None  ACTIONS PERFORMED Patient's equipment was checked for structural stability and fit. Removed material under met heads to create a pocket and encourage offloading. Device(s) intact and fit is excellent. All questions answered and concerns addressed.  PLAN Follow-up as needed (PRN). Plan of care discussed with and agreed upon by patient / caregiver.

## 2021-05-12 NOTE — Patient Instructions (Signed)
Soak Instructions    THE DAY AFTER THE PROCEDURE  Place 1/4 cup of epsom salts in a quart of warm tap water.  Submerge your foot or feet with outer bandage intact for the initial soak; this will allow the bandage to become moist and wet for easy lift off.  Once you remove your bandage, continue to soak in the solution for 20 minutes.  This soak should be done twice a day.  Next, remove your foot or feet from solution, blot dry the affected area and cover.  You may use a band aid large enough to cover the area or use gauze and tape.  Apply other medications to the area as directed by the doctor such as polysporin neosporin.  IF YOUR SKIN BECOMES IRRITATED WHILE USING THESE INSTRUCTIONS, IT IS OKAY TO SWITCH TO  WHITE VINEGAR AND WATER. Or you may use antibacterial soap and water to keep the toe clean  Monitor for any signs/symptoms of infection. Call the office immediately if any occur or go directly to the emergency room. Call with any questions/concerns. Ingrown Toenail An ingrown toenail occurs when the corner or sides of a toenail grow into the surrounding skin. This causes discomfort and pain. The big toe is most commonly affected, but any of the toes can be affected. If an ingrown toenail is not treated, it can become infected. What are the causes? This condition may be caused by: Wearing shoes that are too small or tight. An injury, such as stubbing your toe or having your toe stepped on. Improper cutting or care of your toenails. Having nail or foot abnormalities that were present from birth (congenital abnormalities), such as having a nail that is too big for your toe. What increases the risk? The following factors may make you more likely to develop ingrown toenails: Age. Nails tend to get thicker with age, so ingrown nails are more common among older people. Cutting your toenails incorrectly, such as cutting them very short or cutting them unevenly. An ingrown toenail is more likely  to get infected if you have: Diabetes. Blood flow (circulation) problems. What are the signs or symptoms? Symptoms of an ingrown toenail may include: Pain, soreness, or tenderness. Redness. Swelling. Hardening of the skin that surrounds the toenail. Signs that an ingrown toenail may be infected include: Fluid or pus. Symptoms that get worse. How is this diagnosed? Ingrown toenails may be diagnosed based on: Your symptoms and medical history. A physical exam. Labs or tests. If you have fluid or blood coming from your toenail, a sample may be collected to test for the specific type of bacteria that is causing the infection. How is this treated? Treatment depends on the severity of your symptoms. You may be able to care for your toenail at home. If you have an infection, you may be prescribed antibiotic medicines. If you have fluid or pus draining from your toenail, your health care provider may drain it. If you have trouble walking, you may be given crutches to use. If you have a severe or infected ingrown toenail, you may need a procedure to remove part or all of the nail. Follow these instructions at home: [place] your wound every day for signs of infection, or as often as told by your health care provider. Check for: More redness, swelling, or pain. More fluid or blood. Warmth. Pus or a bad smell. Do not pick at your toenail or try to remove it yourself. Soak your foot in  warm, soapy water. Do this for 20 minutes, 3 times a day, or as often as told by your health care provider. This helps to keep your toe clean and your skin soft. Wear shoes that fit well and are not too tight. Your health care provider may recommend that you wear open-toed shoes while you heal. Trim your toenails regularly and carefully. Cut your toenails straight across to prevent injury to the skin at the corners of the toenail. Do not cut your nails in a curved shape. Keep your feet clean and dry to  help prevent infection. General instructions Take over-the-counter and prescription medicines only as told by your health care provider. If you were prescribed an antibiotic, take it as told by your health care provider. Do not stop taking the antibiotic even if you start to feel better. If your health care provider told you to use crutches to help you move around, use them as instructed. Return to your normal activities as told by your health care provider. Ask your health care provider what activities are safe for you. Keep all follow-up visits. This is important. Contact a health care provider if: You have more redness, swelling, pain, or other symptoms that do not improve with treatment. You have fluid, blood, or pus coming from your toenail. You have a red streak on your skin that starts at your foot and spreads up your leg. You have a fever. Summary An ingrown toenail occurs when the corner or sides of a toenail grow into the surrounding skin. This causes discomfort and pain. The big toe is most commonly affected, but any of the toes can be affected. If an ingrown toenail is not treated, it can become infected. Fluid or pus draining from your toenail is a sign of infection. Your health care provider may need to drain it. You may be given antibiotics to treat the infection. Trimming your toenails regularly and properly can help you prevent an ingrown toenail. This information is not intended to replace advice given to you by your health care provider. Make sure you discuss any questions you have with your health care provider. Document Revised: 07/09/2020 Document Reviewed: 07/09/2020 Elsevier Patient Education  [place].

## 2021-05-13 ENCOUNTER — Encounter: Payer: Self-pay | Admitting: Family Medicine

## 2021-05-13 DIAGNOSIS — M25675 Stiffness of left foot, not elsewhere classified: Secondary | ICD-10-CM | POA: Diagnosis not present

## 2021-05-13 DIAGNOSIS — M25572 Pain in left ankle and joints of left foot: Secondary | ICD-10-CM | POA: Diagnosis not present

## 2021-05-13 DIAGNOSIS — M6281 Muscle weakness (generalized): Secondary | ICD-10-CM | POA: Diagnosis not present

## 2021-05-13 NOTE — Progress Notes (Signed)
Patient returned today after discussion for combination suprascapular nerve block and glenohumeral injection.  After informed written consent timeout was performed, patient was lying on left side on exam table. Right shoulder was prepped with alcohol swab and utilizing posterior approach with ultrasound guidance, needle advanced into spinoglenoid notch and 3:1 lidocaine:depomedrol 40mg  injected for suprascapular nerve block.  Then needle redirected into patient's right glenohumeral space which was injected with 3:1 lidocaine: depomedrol 40mg . Patient tolerated the procedure well without immediate complications.

## 2021-05-15 ENCOUNTER — Encounter: Payer: Self-pay | Admitting: Family Medicine

## 2021-05-15 DIAGNOSIS — M25572 Pain in left ankle and joints of left foot: Secondary | ICD-10-CM | POA: Diagnosis not present

## 2021-05-15 DIAGNOSIS — M6281 Muscle weakness (generalized): Secondary | ICD-10-CM | POA: Diagnosis not present

## 2021-05-15 DIAGNOSIS — E039 Hypothyroidism, unspecified: Secondary | ICD-10-CM

## 2021-05-15 DIAGNOSIS — M25675 Stiffness of left foot, not elsewhere classified: Secondary | ICD-10-CM | POA: Diagnosis not present

## 2021-05-15 DIAGNOSIS — E785 Hyperlipidemia, unspecified: Secondary | ICD-10-CM

## 2021-05-15 MED ORDER — LEVOTHYROXINE SODIUM 125 MCG PO TABS: 125.0000 ug | ORAL_TABLET | Freq: Every day | ORAL | 3 refills | Status: DC

## 2021-05-15 MED ORDER — ATORVASTATIN CALCIUM 10 MG PO TABS: 10.0000 mg | ORAL_TABLET | Freq: Every day | ORAL | 3 refills | Status: DC

## 2021-05-17 NOTE — Progress Notes (Signed)
Subjective: 66 year old female presents for follow-up evaluation of left foot pain, CRPS.  She has had 2 injections which have been helpful and she is scheduled for an ablation with pain management.  She has been on other medications for the CRPS and she seems to be making some progress from that standpoint.  Still concern about the toe leaning inwards and she does point to submetatarsal 2 area where she gets discomfort.  No recent injury or changes otherwise. Denies any systemic complaints such as fevers, chills, nausea, vomiting. No acute changes since last appointment, and no other complaints at this time.   Objective: AAO x3, NAD DP/PT pulses palpable bilaterally, CRT less than 3 seconds From a foot standpoint there is still hallux varus noted.  There is some but swelling noted just proximal to the second metatarsal head this is where she does get discomfort.  No other areas of pinpoint tenderness identified at this time. No pain with calf compression, swelling, warmth, erythema  Assessment: CRPS, capsulitis  Plan: -All treatment options discussed with the patient including all alternatives, risks, complications.  -Hammertoe deformity standpoint I discussed that the only way to correct this is to do surgery but would need to hold off on this for now into the CRPS, nerve issues are taken care of.  She has been doing physical therapy followed with pain management which seems to be helping some.  She is scheduled for an ablation. -Her orthotics are now offloading much.  I have her follow-up with Aaron Edelman for this.  -Inflammatories as needed to help with inflammation. -Patient encouraged to call the office with any questions, concerns, change in symptoms.   [name]

## 2021-05-19 ENCOUNTER — Ambulatory Visit (INDEPENDENT_AMBULATORY_CARE_PROVIDER_SITE_OTHER): Payer: Medicare Other

## 2021-05-19 VITALS — Ht 66.0 in | Wt 190.0 lb

## 2021-05-19 DIAGNOSIS — M25675 Stiffness of left foot, not elsewhere classified: Secondary | ICD-10-CM | POA: Diagnosis not present

## 2021-05-19 DIAGNOSIS — M25572 Pain in left ankle and joints of left foot: Secondary | ICD-10-CM | POA: Diagnosis not present

## 2021-05-19 DIAGNOSIS — Z780 Asymptomatic menopausal state: Secondary | ICD-10-CM | POA: Diagnosis not present

## 2021-05-19 DIAGNOSIS — Z0000 Encounter for general adult medical examination without abnormal findings: Secondary | ICD-10-CM

## 2021-05-19 DIAGNOSIS — M6281 Muscle weakness (generalized): Secondary | ICD-10-CM | POA: Diagnosis not present

## 2021-05-19 NOTE — Progress Notes (Signed)
Subjective:   Elaine Buck is a 66 y.o. female who presents for an Initial Medicare Annual Wellness Visit.  I connected with Rozann today by telephone and verified that I am speaking with the correct person using two identifiers. Location patient: home Location provider: work Persons participating in the virtual visit: patient, Marine scientist.    I discussed the limitations, risks, security and privacy concerns of performing an evaluation and management service by telephone and the availability of in person appointments. I also discussed with the patient that there may be a patient responsible charge related to this service. The patient expressed understanding and verbally consented to this telephonic visit.    Interactive audio and video telecommunications were attempted between this provider and patient, however failed, due to patient having technical difficulties OR patient did not have access to video capability.  We continued and completed visit with audio only.  Some vital signs may be absent or patient reported.   Time Spent with patient on telephone encounter: 20 minutes   Review of Systems     Cardiac Risk Factors include: advanced age (>67men, >64 women);dyslipidemia;obesity (BMI >30kg/m2);sedentary lifestyle;smoking/ tobacco exposure     Objective:    Today's Vitals   05/19/21 1100 05/19/21 1101  Weight: 190 lb (86.2 kg)   Height: 5\' 6"  (1.676 m)   PainSc:  3    Body mass index is 30.67 kg/m.  Advanced Directives 05/19/2021 04/29/2021 06/10/2020 03/11/2020 10/04/2019  Does Patient Have a Medical Advance Directive? Yes Yes Yes Yes Yes  Type of Paramedic of [place];Living will Healthcare Power of [place];Living will;Out of facility DNR (pink MOST or yellow form) [place];Living will;Out of facility DNR (pink MOST or yellow form) -  Does patient want to make changes to medical advance directive? - - - -  No - Patient declined  Copy of [place] in Chart? No - copy requested - - - -    Current Medications (verified) Outpatient Encounter Medications as of 05/19/2021  Medication Sig   ALPRAZolam (XANAX) 0.5 MG tablet Take 1 tablet (0.5 mg total) by mouth daily as needed for anxiety.   atorvastatin (LIPITOR) 10 MG tablet Take 1 tablet (10 mg total) by mouth daily.   b complex vitamins capsule Take 1 capsule by mouth daily.   Calcium Carbonate-Vitamin D 600-400 MG-UNIT tablet Take by mouth.   Cholecalciferol (VITAMIN D3 PO) Take 1 tablet by mouth daily.   gabapentin (NEURONTIN) 300 MG capsule Take 2 capsules (600 mg total) by mouth 2 (two) times daily.   levothyroxine (SYNTHROID) 125 MCG tablet Take 1 tablet (125 mcg total) by mouth daily.   Multiple Vitamin (MULTIVITAMIN) tablet Take 1 tablet by mouth daily.   Omega-3 Fatty Acids (FISH OIL PO) Take 1 tablet by mouth daily.   PFIZER-BIONT COVID-19 VAC-TRIS SUSP injection    tiZANidine (ZANAFLEX) 4 MG tablet Take 2 tablets (8 mg total) by mouth at bedtime as needed for muscle spasms.   topiramate (TOPAMAX) 25 MG tablet Take 2 tablets (50 mg total) by mouth at bedtime.   traMADol (ULTRAM) 50 MG tablet Take 1 tablet (50 mg total) by mouth every 6 (six) hours as needed.   venlafaxine XR (EFFEXOR XR) 150 MG 24 hr capsule Take 1 capsule (150 mg total) by mouth daily with breakfast.   FLUZONE HIGH-DOSE QUADRIVALENT 0.7 ML SUSY  (Patient not taking: Reported on 05/19/2021)   No facility-administered encounter medications on file as  of 05/19/2021.    Allergies (verified) Other and Prunus persica   History: Past Medical History:  Diagnosis Date   Anxiety    Arthritis    Cancer ([place])    breast (Left)   History of colon polyps    History of lobular carcinoma in situ (LCIS) of  left breast    s/p lumpectomy in 2005, Tamoxifen/Evista therapy for 5 years   Hyperlipidemia    Neuromuscular disorder ([place])    small fiber neuropathy,  L foot    Thyroid disease    Past Surgical History:  Procedure Laterality Date   BREAST BIOPSY     BREAST LUMPECTOMY Left 05/22/2003   LCIS Cells found in breast tissue   FOOT SURGERY Left 04/2019   joint inplantment in big toe    TUBAL LIGATION     Family History  Problem Relation Age of Onset   Stroke Mother    Bipolar disorder Son    ADD / ADHD Son    Anxiety disorder Son    Colon cancer Neg Hx    Esophageal cancer Neg Hx    Stomach cancer Neg Hx    Rectal cancer Neg Hx    Social History   Socioeconomic History   Marital status: Married    Spouse name: Not on file   Number of children: 2   Years of education: Not on file   Highest education level: Some college, no degree  Occupational History   Occupation: retired  Tobacco Use   Smoking status: Every Day    Packs/day: 0.80    Years: 20.00    Pack years: 16.00    Types: Cigarettes   Smokeless tobacco: Never   Tobacco comments:    Declines quitting for now  Vaping Use   Vaping Use: Never used  Substance and Sexual Activity   Alcohol use: Yes    Comment: Occasionlly   Drug use: No   Sexual activity: Yes    Partners: Male  Other Topics Concern   Not on file  Social History Narrative   Left Handed   Lives in a one story home   Drinks one cup of coffee in the morning and Decaf in the evening    Social Determinants of Health   Financial Resource Strain: Low Risk    Difficulty of Paying Living Expenses: Not hard at all  Food Insecurity: No Food Insecurity   Worried About Charity fundraiser in the Last Year: Never true   Ran Out of Food in the Last Year: Never true  Transportation Needs: No Transportation Needs   Lack of Transportation (Medical): No   Lack of Transportation (Non-Medical): No  Physical Activity: Inactive   Days of Exercise per Week: 0 days   Minutes of Exercise per Session: 0 min  Stress: No Stress Concern Present   Feeling of Stress : Not at all  Social Connections: Socially Isolated    Frequency of Communication with Friends and Family: Once a week   Frequency of Social Gatherings with Friends and Family: Once a week   Attends Religious Services: Never   Marine scientist or Organizations: No   Attends Music therapist: Never   Marital Status: Married    Tobacco Counseling Ready to quit: Not Answered Counseling given: Not Answered Tobacco comments: Declines quitting for now   Clinical Intake:  Pre-visit preparation completed: Yes  Pain : 0-10 Pain Score: 3  (foot 3 -Arm-4) Pain Type: Chronic pain Pain Location:  Foot (and arm) Pain Onset: More than a month ago Pain Frequency: Constant     BMI - recorded: 30.67 Nutritional Status: BMI > 30  Obese Nutritional Risks: Unintentional weight gain (5 pounds) Diabetes: No  How often do you need to have someone help you when you read instructions, pamphlets, or other written materials from your doctor or pharmacy?: 1 - Never  Diabetic?No  Interpreter Needed?: No  Information entered by :: Caroleen Hamman LPN   Activities of Daily Living In your present state of health, do you have any difficulty performing the following activities: 05/19/2021 08/07/2020  Hearing? N N  Vision? N N  Difficulty concentrating or making decisions? N N  Walking or climbing stairs? N N  Dressing or bathing? N N  Doing errands, shopping? N N  Preparing Food and eating ? N -  Using the Toilet? N -  In the past six months, have you accidently leaked urine? N -  Do you have problems with loss of bowel control? N -  Managing your Medications? N -  Managing your Finances? N -  Housekeeping or managing your Housekeeping? N -  Some recent data might be hidden    Patient Care Team: Shelda Pal, DO as PCP - General (Family Medicine) Alda Berthold, DO as Consulting Physician (Neurology)  Indicate any recent Medical Services you may have received from other than [facility] providers in the past year (date  may be approximate).     Assessment:   This is a routine wellness examination for [place].  Hearing/Vision screen Hearing Screening - Comments:: Bilateral hearing aids Vision Screening - Comments:: Last eye exam-10/2020  Dietary issues and exercise activities discussed: Current Exercise Habits: The patient does not participate in regular exercise at present, Exercise limited by: orthopedic condition(s)   Goals Addressed             This Visit's Progress    Patient Stated       Drink more water & would like to increase exercise once able to do so       Depression Screen PHQ 2/9 Scores 05/19/2021 04/29/2021 02/24/2021 01/07/2021 12/24/2020 10/21/2016  PHQ - 2 Score 0 0 0 0 0 0  PHQ- 9 Score - - - - 1 0    Fall Risk Fall Risk  05/19/2021 04/29/2021 02/24/2021 01/07/2021 12/24/2020  Falls in the past year? 0 0 0 0 0  Number falls in past yr: 0 - 0 0 -  Injury with Fall? 0 - 0 0 -  Risk for fall due to : - - No Fall Risks - -  Follow up Falls prevention discussed - Falls evaluation completed - -    FALL RISK PREVENTION PERTAINING TO THE HOME:  Any stairs in or around the home? Yes  If so, are there any without handrails? No  Home free of loose throw rugs in walkways, pet beds, electrical cords, etc? Yes  Adequate lighting in your home to reduce risk of falls? Yes   ASSISTIVE DEVICES UTILIZED TO PREVENT FALLS:  Life alert? No  Use of a cane, walker or w/c? No  Grab bars in the bathroom? No  Shower chair or bench in shower? Yes  Elevated toilet seat or a handicapped toilet? No   TIMED UP AND GO:  Was the test performed? No . Phone visit   Cognitive Function:Normal cognitive status assessed by this Nurse Health Advisor. No abnormalities found.          Immunizations  Immunization History  Administered Date(s) Administered   Influenza, High Dose Seasonal PF 12/31/2019, 01/01/2021   Influenza,inj,Quad PF,6+ Mos 12/28/2016, 01/14/2018, 12/06/2018   Influenza-Unspecified  12/22/2015   PFIZER(Purple Top)SARS-COV-2 Vaccination 06/02/2019, 06/23/2019, 11/26/2019, 06/26/2020, 01/14/2021   PNEUMOCOCCAL CONJUGATE-20 02/24/2021   Pfizer Covid-19 Vaccine Bivalent Booster 34yrs & up 01/14/2021   Pneumococcal Polysaccharide-23 10/21/2016   Tdap 12/28/2016   Zoster Recombinat (Shingrix) 01/14/2018, 08/19/2018   Zoster, Live 03/23/2012    TDAP status: Up to date  Flu Vaccine status: Up to date  Pneumococcal vaccine status: Up to date  Covid-19 vaccine status: Completed vaccines  Qualifies for Shingles Vaccine? No   Zostavax completed Yes   Shingrix Completed?: Yes  Screening Tests Health Maintenance  Topic Date Due   COVID-19 Vaccine (6 - Booster for Pfizer series) 03/11/2021   MAMMOGRAM  10/29/2022   COLONOSCOPY (Pts 45-55yrs Insurance coverage will need to be confirmed)  01/24/2025   TETANUS/TDAP  12/29/2026   Pneumonia Vaccine 38+ Years old  Completed   INFLUENZA VACCINE  Completed   DEXA SCAN  Completed   Hepatitis C Screening  Completed   Zoster Vaccines- Shingrix  Completed   HPV VACCINES  Aged Out    Health Maintenance  Health Maintenance Due  Topic Date Due   COVID-19 Vaccine (6 - Booster for [place] series) 03/11/2021    Colorectal cancer screening: Type of screening: Colonoscopy. Completed 01/25/2020. Repeat every 3-5 years  Mammogram status: Completed bilateral 10/28/2020. Repeat every year  Bone Density status: Ordered today. Pt provided with contact info and advised to call to schedule appt.  Lung Cancer Screening: (Low Dose CT Chest recommended if Age 62-80 years, 30 pack-year currently smoking OR have quit w/in 15years.) does not qualify.     Additional Screening:  Hepatitis C Screening: Completed 06/21/2016  Vision Screening: Recommended annual ophthalmology exams for early detection of glaucoma and other disorders of the eye. Is the patient up to date with their annual eye exam?  Yes  Who is the provider or what is the name  of the office in which the patient attends annual eye exams? Pt unsure of name   Dental Screening: Recommended annual dental exams for proper oral hygiene  Community Resource Referral / Chronic Care Management: CRR required this visit?  No   CCM required this visit?  No      Plan:     I have personally reviewed and noted the following in the patients chart:   Medical and social history Use of alcohol, tobacco or illicit drugs  Current medications and supplements including opioid prescriptions. Patient is not currently taking opioid prescriptions. Functional ability and status Nutritional status Physical activity Advanced directives List of other physicians Hospitalizations, surgeries, and ER visits in previous 12 months Vitals Screenings to include cognitive, depression, and falls Referrals and appointments  In addition, I have reviewed and discussed with patient certain preventive protocols, quality metrics, and best practice recommendations. A written personalized care plan for preventive services as well as general preventive health recommendations were provided to patient.    Due to this being a telephonic visit, the after visit summary with patients personalized plan was offered to patient via mail or my-chart. Patient would like to access on my-chart.    Marta Antu, LPN   [id number]  Nurse Health Advisor  Nurse Notes: None

## 2021-05-19 NOTE — Patient Instructions (Signed)
Elaine Buck , Thank you for taking time to complete your Medicare Wellness Visit. I appreciate your ongoing commitment to your health goals. Please review the following plan we discussed and let me know if I can assist you in the future.   Screening recommendations/referrals: Colonoscopy: Completed 01/25/2020-Due 01/24/2025 Mammogram: Completed 10/28/2020-Due 10/29/2022 Bone Density: Ordered today. Someone will call you to schedule. Recommended yearly ophthalmology/optometry visit for glaucoma screening and checkup Recommended yearly dental visit for hygiene and checkup  Vaccinations: Influenza vaccine: Up to date Pneumococcal vaccine: Up to date Tdap vaccine: Up to date Shingles vaccine: Completed vaccines   Covid-19:Up to date  Advanced directives: Please bring a copy of Living Will and/or Healthcare Power of Attorney for your chart.   Conditions/risks identified: See problem list  Next appointment: Follow up in one year for your annual wellness visit    Preventive Care 65 Years and Older, Female Preventive care refers to lifestyle choices and visits with your health care provider that can promote health and wellness. What does preventive care include? A yearly physical exam. This is also called an annual well check. Dental exams once or twice a year. Routine eye exams. Ask your health care provider how often you should have your eyes checked. Personal lifestyle choices, including: Daily care of your teeth and gums. Regular physical activity. Eating a healthy diet. Avoiding tobacco and drug use. Limiting alcohol use. Practicing safe sex. Taking low-dose aspirin every day. Taking vitamin and mineral supplements as recommended by your health care provider. What happens during an annual well check? The services and screenings done by your health care provider during your annual well check will depend on your age, overall health, lifestyle risk factors, and family history of  disease. Counseling  Your health care provider may ask you questions about your: Alcohol use. Tobacco use. Drug use. Emotional well-being. Home and relationship well-being. Sexual activity. Eating habits. History of falls. Memory and ability to understand (cognition). Work and work Statistician. Reproductive health. Screening  You may have the following tests or measurements: Height, weight, and BMI. Blood pressure. Lipid and cholesterol levels. These may be checked every 5 years, or more frequently if you are over 54 years old. Skin check. Lung cancer screening. You may have this screening every year starting at age 47 if you have a 30-pack-year history of smoking and currently smoke or have quit within the past 15 years. Fecal occult blood test (FOBT) of the stool. You may have this test every year starting at age 31. Flexible sigmoidoscopy or colonoscopy. You may have a sigmoidoscopy every 5 years or a colonoscopy every 10 years starting at age 70. Hepatitis C blood test. Hepatitis B blood test. Sexually transmitted disease (STD) testing. Diabetes screening. This is done by checking your blood sugar (glucose) after you have not eaten for a while (fasting). You may have this done every 1-3 years. Bone density scan. This is done to screen for osteoporosis. You may have this done starting at age 36. Mammogram. This may be done every 1-2 years. Talk to your health care provider about how often you should have regular mammograms. Talk with your health care provider about your test results, treatment options, and if necessary, the need for more tests. Vaccines  Your health care provider may recommend certain vaccines, such as: Influenza vaccine. This is recommended every year. Tetanus, diphtheria, and acellular pertussis (Tdap, Td) vaccine. You may need a Td booster every 10 years. Zoster vaccine. You may need this after age  60. Pneumococcal 13-valent conjugate (PCV13) vaccine. One  dose is recommended after age 63. Pneumococcal polysaccharide (PPSV23) vaccine. One dose is recommended after age 33. Talk to your health care provider about which screenings and vaccines you need and how often you need them. This information is not intended to replace advice given to you by your health care provider. Make sure you discuss any questions you have with your health care provider. Document Released: 04/05/2015 Document Revised: 11/27/2015 Document Reviewed: 01/08/2015 Elsevier Interactive Patient Education  2017 [place] Prevention in the Home Falls can cause injuries. They can happen to people of all ages. There are many things you can do to make your home safe and to help prevent falls. What can I do on the outside of my home? Regularly fix the edges of walkways and driveways and fix any cracks. Remove anything that might make you trip as you walk through a door, such as a raised step or threshold. Trim any bushes or trees on the path to your home. Use bright outdoor lighting. Clear any walking paths of anything that might make someone trip, such as rocks or tools. Regularly check to see if handrails are loose or broken. Make sure that both sides of any steps have handrails. Any raised decks and porches should have guardrails on the edges. Have any leaves, snow, or ice cleared regularly. Use sand or salt on walking paths during winter. Clean up any spills in your garage right away. This includes oil or grease spills. What can I do in the bathroom? Use night lights. Install grab bars by the toilet and in the tub and shower. Do not use towel bars as grab bars. Use non-skid mats or decals in the tub or shower. If you need to sit down in the shower, use a plastic, non-slip stool. Keep the floor dry. Clean up any water that spills on the floor as soon as it happens. Remove soap buildup in the tub or shower regularly. Attach bath mats securely with double-sided  non-slip rug tape. Do not have throw rugs and other things on the floor that can make you trip. What can I do in the bedroom? Use night lights. Make sure that you have a light by your bed that is easy to reach. Do not use any sheets or blankets that are too big for your bed. They should not hang down onto the floor. Have a firm chair that has side arms. You can use this for support while you get dressed. Do not have throw rugs and other things on the floor that can make you trip. What can I do in the kitchen? Clean up any spills right away. Avoid walking on wet floors. Keep items that you use a lot in easy-to-reach places. If you need to reach something above you, use a strong step stool that has a grab bar. Keep electrical cords out of the way. Do not use floor polish or wax that makes floors slippery. If you must use wax, use non-skid floor wax. Do not have throw rugs and other things on the floor that can make you trip. What can I do with my stairs? Do not leave any items on the stairs. Make sure that there are handrails on both sides of the stairs and use them. Fix handrails that are broken or loose. Make sure that handrails are as long as the stairways. Check any carpeting to make sure that it is firmly attached to the stairs. Fix  any carpet that is loose or worn. Avoid having throw rugs at the top or bottom of the stairs. If you do have throw rugs, attach them to the floor with carpet tape. Make sure that you have a light switch at the top of the stairs and the bottom of the stairs. If you do not have them, ask someone to add them for you. What else can I do to help prevent falls? Wear shoes that: Do not have high heels. Have rubber bottoms. Are comfortable and fit you well. Are closed at the toe. Do not wear sandals. If you use a stepladder: Make sure that it is fully opened. Do not climb a closed stepladder. Make sure that both sides of the stepladder are locked into place. Ask  someone to hold it for you, if possible. Clearly mark and make sure that you can see: Any grab bars or handrails. First and last steps. Where the edge of each step is. Use tools that help you move around (mobility aids) if they are needed. These include: Canes. Walkers. Scooters. Crutches. Turn on the lights when you go into a dark area. Replace any light bulbs as soon as they burn out. Set up your furniture so you have a clear path. Avoid moving your furniture around. If any of your floors are uneven, fix them. If there are any pets around you, be aware of where they are. Review your medicines with your doctor. Some medicines can make you feel dizzy. This can increase your chance of falling. Ask your doctor what other things that you can do to help prevent falls. This information is not intended to replace advice given to you by your health care provider. Make sure you discuss any questions you have with your health care provider. Document Released: 01/03/2009 Document Revised: 08/15/2015 Document Reviewed: 04/13/2014 Elsevier Interactive Patient Education  2017 [employer].

## 2021-05-22 DIAGNOSIS — M25675 Stiffness of left foot, not elsewhere classified: Secondary | ICD-10-CM | POA: Diagnosis not present

## 2021-05-22 DIAGNOSIS — M6281 Muscle weakness (generalized): Secondary | ICD-10-CM | POA: Diagnosis not present

## 2021-05-22 DIAGNOSIS — M25572 Pain in left ankle and joints of left foot: Secondary | ICD-10-CM | POA: Diagnosis not present

## 2021-05-26 DIAGNOSIS — M25572 Pain in left ankle and joints of left foot: Secondary | ICD-10-CM | POA: Diagnosis not present

## 2021-05-26 DIAGNOSIS — M25675 Stiffness of left foot, not elsewhere classified: Secondary | ICD-10-CM | POA: Diagnosis not present

## 2021-05-26 DIAGNOSIS — M6281 Muscle weakness (generalized): Secondary | ICD-10-CM | POA: Diagnosis not present

## 2021-05-30 DIAGNOSIS — G90522 Complex regional pain syndrome I of left lower limb: Secondary | ICD-10-CM | POA: Diagnosis not present

## 2021-06-02 DIAGNOSIS — G90522 Complex regional pain syndrome I of left lower limb: Secondary | ICD-10-CM | POA: Diagnosis not present

## 2021-06-02 DIAGNOSIS — G894 Chronic pain syndrome: Secondary | ICD-10-CM | POA: Diagnosis not present

## 2021-06-04 ENCOUNTER — Telehealth (HOSPITAL_BASED_OUTPATIENT_CLINIC_OR_DEPARTMENT_OTHER): Payer: Self-pay

## 2021-06-04 ENCOUNTER — Ambulatory Visit: Payer: Medicare Other | Admitting: Family Medicine

## 2021-06-04 ENCOUNTER — Encounter: Payer: Self-pay | Admitting: Family Medicine

## 2021-06-04 DIAGNOSIS — M7501 Adhesive capsulitis of right shoulder: Secondary | ICD-10-CM | POA: Diagnosis not present

## 2021-06-04 NOTE — Progress Notes (Signed)
PCP: Shelda Pal, DO ? ?Subjective:  ? ?HPI: ?Patient is a 66 y.o. female here for right shoulder pain. ? ?1/30: ?Pain worsening since Aug 2022.  Thinks happened when playing pool Volleyball and reached out to hit ball.  Did not hear any popping sensation at that time.  No swelling or bruising.  Seen by PCP and was given exercises which has not helped.  Received glenohumeral injection but stated that it did nothing to help with pain.  Pain is a burning sensation on the right deltoid area only.  Wakes her up at night. Denies any fevers, numbness, tingling or weakness of extremities. Has not taken any OTC medications for pain. ? ?2/16: ?Patient returned for intraarticular steroid injection with suprascapular nerve block. ? ?3/15: ?Patient had steroid flare with injection at last visit but resolved after a few days. ?Her pain and motion have significantly improved - about 85% better. ?No nighttime pain - able to sleep through night now. ?Doing home motion exercises. ? ?Past Medical History:  ?Diagnosis Date  ? Anxiety   ? Arthritis   ? Cancer [facility])   ? breast (Left)  ? History of colon polyps   ? History of lobular carcinoma in situ (LCIS) of  left breast   ? s/p lumpectomy in 2005, Tamoxifen/Evista therapy for 5 years  ? Hyperlipidemia   ? Neuromuscular disorder ([place])   ? small fiber neuropathy, L foot   ? Thyroid disease   ? ? ?Current Outpatient Medications on File Prior to Visit  ?Medication Sig Dispense Refill  ? ALPRAZolam (XANAX) 0.5 MG tablet Take 1 tablet (0.5 mg total) by mouth daily as needed for anxiety. 30 tablet 5  ? atorvastatin (LIPITOR) 10 MG tablet Take 1 tablet (10 mg total) by mouth daily. 90 tablet 3  ? b complex vitamins capsule Take 1 capsule by mouth daily.    ? Calcium Carbonate-Vitamin D 600-400 MG-UNIT tablet Take by mouth.    ? Cholecalciferol (VITAMIN D3 PO) Take 1 tablet by mouth daily.    ? FLUZONE HIGH-DOSE QUADRIVALENT 0.7 ML SUSY  (Patient not taking: Reported on 05/19/2021)     ? gabapentin (NEURONTIN) 300 MG capsule Take 2 capsules (600 mg total) by mouth 2 (two) times daily. 360 capsule 3  ? levothyroxine (SYNTHROID) 125 MCG tablet Take 1 tablet (125 mcg total) by mouth daily. 90 tablet 3  ? Multiple Vitamin (MULTIVITAMIN) tablet Take 1 tablet by mouth daily.    ? Omega-3 Fatty Acids (FISH OIL PO) Take 1 tablet by mouth daily.    ? PFIZER-BIONT COVID-19 VAC-TRIS SUSP injection     ? tiZANidine (ZANAFLEX) 4 MG tablet Take 2 tablets (8 mg total) by mouth at bedtime as needed for muscle spasms. 60 tablet 5  ? topiramate (TOPAMAX) 25 MG tablet Take 2 tablets (50 mg total) by mouth at bedtime. 60 tablet 3  ? traMADol (ULTRAM) 50 MG tablet Take 1 tablet (50 mg total) by mouth every 6 (six) hours as needed. 20 tablet 0  ? venlafaxine XR (EFFEXOR XR) 150 MG 24 hr capsule Take 1 capsule (150 mg total) by mouth daily with breakfast. 90 capsule 1  ? ?No current facility-administered medications on file prior to visit.  ? ? ?Past Surgical History:  ?Procedure Laterality Date  ? BREAST BIOPSY    ? BREAST LUMPECTOMY Left 05/22/2003  ? LCIS Cells found in breast tissue  ? FOOT SURGERY Left 04/2019  ? joint inplantment in big toe   ? TUBAL  LIGATION    ? ? ?Allergies  ?Allergen Reactions  ? Other Hives and Itching  ?  Peaches  ? Prunus Persica   ? ? ?BP 118/82   Ht '5\' 6"'$  (1.676 m)   Wt 190 lb (86.2 kg)   BMI 30.67 kg/m?  ? ?No flowsheet data found. ? ?No flowsheet data found. ? ?    ?Objective:  ?Physical Exam: ? ?Gen: NAD, comfortable in exam room ? ?Right shoulder: ?No swelling, ecchymoses.  No gross deformity. ?No TTP. ?Full ER.  Lacks 10 degrees flexion and abduction compared to left. ?Negative Hawkins, Neers. ?Strength 5/5 with empty can and resisted internal/external rotation.  No pain. ?NV intact distally. ?  ?Assessment & Plan:  ?1. Right shoulder pain - 2/2 adhesive capsulitis.  Significant improvement with suprascapular nerve block combined with intraarticular injection.  Continue home  motion exercises for next month then start home exercises with theraband for rotator cuff strengthening.  Follow up with [place] as needed. ?

## 2021-06-04 NOTE — Progress Notes (Signed)
Patient was instructed in 10 minutes of therapeutic exercises for right shoulder pain to improve strength, ROM and function according to my instructions and plan of care by a Certified Athletic Trainer during the office visit.  Proper technique shown and discussed, handout provided.  All questions discussed and answered.  ?

## 2021-06-04 NOTE — Patient Instructions (Signed)
I'm glad you're doing much better! ?Continue your motion exercises including the wall walking, door frame external rotation stretches - hold for 15 seconds when you do these. ?Wait about a month before starting the strengthening exercises with the yellow theraband - do them 3-4 days a week. ?Call me or message me through mychart if you have any questions otherwise follow up as needed. ? ?

## 2021-06-10 ENCOUNTER — Encounter: Payer: Self-pay | Admitting: Podiatry

## 2021-06-10 NOTE — Telephone Encounter (Signed)
Please advise 

## 2021-06-11 ENCOUNTER — Other Ambulatory Visit: Payer: Self-pay | Admitting: Podiatry

## 2021-06-11 MED ORDER — METHYLPREDNISOLONE 4 MG PO TBPK: ORAL_TABLET | ORAL | 0 refills | Status: DC

## 2021-06-19 ENCOUNTER — Ambulatory Visit: Payer: Medicare Other | Admitting: Physical Medicine and Rehabilitation

## 2021-06-19 ENCOUNTER — Ambulatory Visit (HOSPITAL_BASED_OUTPATIENT_CLINIC_OR_DEPARTMENT_OTHER)
Admission: RE | Admit: 2021-06-19 | Discharge: 2021-06-19 | Disposition: A | Discharge status: Home / Self Care | Payer: Medicare Other | Source: Ambulatory Visit | Attending: Family Medicine | Admitting: Family Medicine

## 2021-06-19 DIAGNOSIS — Z780 Asymptomatic menopausal state: Secondary | ICD-10-CM | POA: Insufficient documentation

## 2021-06-19 DIAGNOSIS — M8589 Other specified disorders of bone density and structure, multiple sites: Secondary | ICD-10-CM | POA: Diagnosis not present

## 2021-06-28 ENCOUNTER — Other Ambulatory Visit: Payer: Self-pay | Admitting: Podiatry

## 2021-06-28 MED ORDER — TOPIRAMATE 25 MG PO TABS: 50.0000 mg | ORAL_TABLET | Freq: Every evening | ORAL | 3 refills | Status: DC

## 2021-07-17 DIAGNOSIS — G894 Chronic pain syndrome: Secondary | ICD-10-CM | POA: Diagnosis not present

## 2021-07-17 DIAGNOSIS — G90522 Complex regional pain syndrome I of left lower limb: Secondary | ICD-10-CM | POA: Diagnosis not present

## 2021-07-28 ENCOUNTER — Ambulatory Visit: Payer: Medicare Other | Admitting: Podiatry

## 2021-08-08 ENCOUNTER — Ambulatory Visit: Payer: Medicare Other | Admitting: Podiatry

## 2021-08-08 DIAGNOSIS — M7752 Other enthesopathy of left foot: Secondary | ICD-10-CM

## 2021-08-08 DIAGNOSIS — G9050 Complex regional pain syndrome I, unspecified: Secondary | ICD-10-CM | POA: Diagnosis not present

## 2021-08-08 DIAGNOSIS — M2032 Hallux varus (acquired), left foot: Secondary | ICD-10-CM

## 2021-08-08 NOTE — Patient Instructions (Signed)

## 2021-08-11 NOTE — Progress Notes (Signed)
Subjective: 66 year old female presents the office today for follow evaluation of left foot pain, CRPS, capsulitis.  Since I last saw her she underwent an ablation she states that helped with the tingling and the nerve symptoms to her feet for about 4 weeks.  Also the steroid, Medrol Dosepak to help with the inflammation for some time but has come back in we discussed steroid injection previously and she is interested in proceeding with this.  As far as the nerve issues goes she is currently in the process of thinking about a nerve stimulator.  No injury or change otherwise.  Objective: AAO x3, NAD DP/PT pulses palpable bilaterally, CRT less than 3 seconds Overall exam appears to be mostly unchanged.  Hallux varus is noted.  Some swelling is present along the hallux.  There is no open lesion and there is no erythema or warmth.  Minimal bruised appearance to the dorsal aspect the hallux.  We will treat tenderness is localized on the second MPJ there is mild edema present this area again without any erythema or warmth.  Hammertoe contracture present second digit. No pain with calf compression, swelling, warmth, erythema  Assessment: 66 year old female with hallux varus, capsulitis, CRPS  Plan: -All treatment options discussed with the patient including all alternatives, risks, complications.  -We will hold off on x-rays there is no injury.  We discussed steroid injection ultimately we decided to do this.  Skin was cleaned with Betadine, alcohol and a mixture of 0.5 cc dexamethasone phosphate, 0.75 cc dexamethasone phosphate was infiltrated around and into the second MPJ without complications.  Postinjection care discussed.  Tolerated well.  Can consider repeat Medrol Dosepak in the future if needed.  Ultimately discussed surgical intervention for the hallux and second toe.  However given her CRPS is better increased risk of complications. -Continue follow-up with her pain management doctor for the  nerves.  Consider another ablation versus nerve stimulator.  [name]

## 2021-09-15 ENCOUNTER — Ambulatory Visit (HOSPITAL_BASED_OUTPATIENT_CLINIC_OR_DEPARTMENT_OTHER)
Admission: RE | Admit: 2021-09-15 | Discharge: 2021-09-15 | Disposition: A | Discharge status: Home / Self Care | Payer: Medicare Other | Source: Ambulatory Visit | Attending: Family Medicine | Admitting: Family Medicine

## 2021-09-15 ENCOUNTER — Encounter: Payer: Self-pay | Admitting: Family Medicine

## 2021-09-15 ENCOUNTER — Ambulatory Visit (INDEPENDENT_AMBULATORY_CARE_PROVIDER_SITE_OTHER): Payer: Medicare Other | Admitting: Family Medicine

## 2021-09-15 VITALS — BP 119/63 | HR 78 | Ht 66.0 in | Wt 196.2 lb

## 2021-09-15 DIAGNOSIS — M545 Low back pain, unspecified: Secondary | ICD-10-CM

## 2021-09-15 MED ORDER — NAPROXEN 500 MG PO TABS: 500.0000 mg | ORAL_TABLET | Freq: Two times a day (BID) | ORAL | 0 refills | Status: DC

## 2021-09-16 ENCOUNTER — Other Ambulatory Visit (HOSPITAL_BASED_OUTPATIENT_CLINIC_OR_DEPARTMENT_OTHER): Payer: Self-pay | Admitting: Family Medicine

## 2021-09-16 DIAGNOSIS — Z1231 Encounter for screening mammogram for malignant neoplasm of breast: Secondary | ICD-10-CM

## 2021-09-17 ENCOUNTER — Encounter: Payer: Self-pay | Admitting: Family Medicine

## 2021-09-17 ENCOUNTER — Ambulatory Visit: Payer: Medicare Other | Admitting: Family Medicine

## 2021-09-17 ENCOUNTER — Ambulatory Visit: Payer: Self-pay

## 2021-09-17 VITALS — BP 140/86 | Ht 66.0 in | Wt 195.0 lb

## 2021-09-17 DIAGNOSIS — M533 Sacrococcygeal disorders, not elsewhere classified: Secondary | ICD-10-CM

## 2021-09-17 MED ORDER — TRIAMCINOLONE ACETONIDE 40 MG/ML IJ SUSP
40.0000 mg | Freq: Once | INTRAMUSCULAR | Status: AC
  Administered 2021-09-17: 40 mg via INTRA_ARTICULAR

## 2021-09-17 NOTE — Addendum Note (Signed)
Addended by: Cresenciano Lick on: 09/17/2021 10:18 AM   Modules accepted: Orders

## 2021-09-17 NOTE — Assessment & Plan Note (Signed)
Acutely occurring.  She does have degenerative changes in the lumbar spine on x-ray but seems more consistent with the SI joint at this time.  Less likely for nerve impingement. -Counseled on home exercise therapy and supportive care. -Injection today. -Could consider physical therapy.

## 2021-09-17 NOTE — Patient Instructions (Signed)
Nice to meet you Please alternate heat and ice  Please try the exercises   Please send me a message in MyChart with any questions or updates.  Please see me back in 3-4 weeks.   --Dr. Ersa Delaney  

## 2021-09-17 NOTE — Progress Notes (Signed)
  Elaine Buck - 66 y.o. female MRN [id number]  Date of birth: Feb 04, 1956  SUBJECTIVE:  Including CC & ROS.  No chief complaint on file.   Elaine Buck is a 66 y.o. female that is presenting with acute worsening right lower back and SI joint pain.  Recently got worse over the past weekend.  No history of injury.  Pain is localized to the back with no radiation.  Independent review of the lumbar spine x-ray from 6/26 shows degenerative changes between L5 and S1 as well as facet arthrosis.  Review of Systems See HPI   HISTORY: Past Medical, Surgical, Social, and Family History Reviewed & Updated per EMR.   Pertinent Historical Findings include:  Past Medical History:  Diagnosis Date   Anxiety    Arthritis    Cancer ([place])    breast (Left)   History of colon polyps    History of lobular carcinoma in situ (LCIS) of  left breast    s/p lumpectomy in 2005, Tamoxifen/Evista therapy for 5 years   Hyperlipidemia    Neuromuscular disorder ([place])    small fiber neuropathy, L foot    Thyroid disease     Past Surgical History:  Procedure Laterality Date   BREAST BIOPSY     BREAST LUMPECTOMY Left 05/22/2003   LCIS Cells found in breast tissue   FOOT SURGERY Left 04/2019   joint inplantment in big toe    TUBAL LIGATION       PHYSICAL EXAM:  VS: BP 140/86 (BP Location: Left Arm, Patient Position: Sitting, Cuff Size: Normal)   Ht '5\' 6"'$  (1.676 m)   Wt 195 lb (88.5 kg)   BMI 31.47 kg/m  Physical Exam Gen: NAD, alert, cooperative with exam, well-appearing MSK:  Neurovascularly intact     Aspiration/Injection Procedure Note Jaylan Duggar 1955/12/07  Procedure: Injection Indications: Right SI joint pain  Procedure Details Consent: Risks of procedure as well as the alternatives and risks of each were explained to the (patient/caregiver).  Consent for procedure obtained. Time Out: Verified patient identification, verified procedure, site/side was marked, verified correct  patient position, special equipment/implants available, medications/allergies/relevent history reviewed, required imaging and test results available.  Performed.  The area was cleaned with iodine and alcohol swabs.    The right SI joint was injected using 3 cc of 1% lidocaine on a 22-gauge 3-1/2 inch needle.  The syringe was switched and a mixture containing 1 cc's of 40 mg Kenalog and 4 cc's of 0.5% bupivacaine was injected.  Ultrasound was needed to visualize the needle at the endpoint and the joint.  Ultrasound was used. Images were obtained in long views showing the injection.     A sterile dressing was applied.  Patient did tolerate procedure well.     ASSESSMENT & PLAN:   Sacroiliac joint dysfunction of right side Acutely occurring.  She does have degenerative changes in the lumbar spine on x-ray but seems more consistent with the SI joint at this time.  Less likely for nerve impingement. -Counseled on home exercise therapy and supportive care. -Injection today. -Could consider physical therapy.

## 2021-09-20 DIAGNOSIS — R252 Cramp and spasm: Secondary | ICD-10-CM

## 2021-09-20 HISTORY — DX: Cramp and spasm: R25.2

## 2021-10-09 ENCOUNTER — Ambulatory Visit (INDEPENDENT_AMBULATORY_CARE_PROVIDER_SITE_OTHER): Payer: Medicare Other | Admitting: Family Medicine

## 2021-10-09 ENCOUNTER — Encounter: Payer: Self-pay | Admitting: Family Medicine

## 2021-10-09 ENCOUNTER — Other Ambulatory Visit: Payer: Self-pay

## 2021-10-09 VITALS — BP 130/78 | HR 78 | Temp 98.2°F | Resp 18 | Ht 66.0 in | Wt 192.6 lb

## 2021-10-09 DIAGNOSIS — F419 Anxiety disorder, unspecified: Secondary | ICD-10-CM

## 2021-10-09 DIAGNOSIS — E039 Hypothyroidism, unspecified: Secondary | ICD-10-CM | POA: Diagnosis not present

## 2021-10-09 DIAGNOSIS — E785 Hyperlipidemia, unspecified: Secondary | ICD-10-CM | POA: Diagnosis not present

## 2021-10-09 DIAGNOSIS — R252 Cramp and spasm: Secondary | ICD-10-CM

## 2021-10-09 LAB — MAGNESIUM: Magnesium: 1.9 mg/dL (ref 1.5–2.5)

## 2021-10-09 LAB — LIPID PANEL
Cholesterol: 173 mg/dL (ref 0–200)
HDL: 54.1 mg/dL (ref >39.00)
LDL Cholesterol: 99 mg/dL (ref 0–99)
NonHDL: 119.1
Total CHOL/HDL Ratio: 3
Triglycerides: 100 mg/dL (ref 0.0–149.0)
VLDL: 20 mg/dL (ref 0.0–40.0)

## 2021-10-09 LAB — COMPREHENSIVE METABOLIC PANEL
ALT: 23 U/L (ref 0–35)
AST: 23 U/L (ref 0–37)
Albumin: 4.6 g/dL (ref 3.5–5.2)
Alkaline Phosphatase: 66 U/L (ref 39–117)
BUN: 17 mg/dL (ref 6–23)
CO2: 28 mEq/L (ref 19–32)
Calcium: 9.1 mg/dL (ref 8.4–10.5)
Chloride: 106 mEq/L (ref 96–112)
Creatinine, Ser: 0.82 mg/dL (ref 0.40–1.20)
GFR: 74.24 mL/min (ref >60.00)
Glucose, Bld: 98 mg/dL (ref 70–99)
Potassium: 4.2 mEq/L (ref 3.5–5.1)
Sodium: 141 mEq/L (ref 135–145)
Total Bilirubin: 0.6 mg/dL (ref 0.2–1.2)
Total Protein: 6.8 g/dL (ref 6.0–8.3)

## 2021-10-09 LAB — TSH: TSH: 0.07 u[IU]/mL — ABNORMAL LOW (ref 0.35–5.50)

## 2021-10-09 MED ORDER — METHOCARBAMOL 500 MG PO TABS: 500.0000 mg | ORAL_TABLET | Freq: Every evening | ORAL | 5 refills | Status: DC

## 2021-10-09 MED ORDER — VENLAFAXINE HCL ER 150 MG PO CP24: 150.0000 mg | ORAL_CAPSULE | Freq: Every day | ORAL | 3 refills | Status: DC

## 2021-10-09 NOTE — Progress Notes (Signed)
Chief Complaint  Patient presents with   Follow-up    6 month    Subjective: Hyperlipidemia Patient presents for Hyperlipidemia follow up. Currently taking Lipitor 10 mg/d and compliance with treatment thus far has been good. She is having nighttime cramping in her legs.  She is adhering to a healthy diet. Exercise: pool exercises The patient is not known to have coexisting coronary artery disease.  Hypothyroidism Patient presents for follow-up of hypothyroidism.  Reports compliance with medication. Current symptoms include: denies fatigue, weight changes, heat/cold intolerance, bowel/skin changes or CVS symptoms She believes her dose should be not significantly changed  GAD History of generalized anxiety taking Effexor XR 150 mg daily.  She reports compliance with no adverse effects.  She also takes Xanax as needed.  She has not been taking it as frequently recently.  No homicidal or suicidal ideation.  No self-medication.  She is not currently following with a therapist.  Leg cramping Patient has a history of nocturnal leg cramping.  She was taking tizanidine 8 mg nightly which was helpful but over the past few weeks stopped being as helpful.  It affects both legs.  When she stretches 1 leg, another will ensue.  She takes oral magnesium daily in addition to staying well-hydrated.  She tried pickle juice which was not helpful.  She is repulsed by mustard and does not want to try.  She tried cyclobenzaprine in the past which was not helpful.  Historically, her electrolytes have been unremarkable. Past Medical History:  Diagnosis Date   Anxiety    Arthritis    Cancer ([place])    breast (Left)   History of colon polyps    History of lobular carcinoma in situ (LCIS) of  left breast    s/p lumpectomy in 2005, Tamoxifen/Evista therapy for 5 years   Hyperlipidemia    Neuromuscular disorder ([place])    small fiber neuropathy, L foot    Thyroid disease     Objective: BP 130/78   Pulse 78    Temp 98.2 F (36.8 C)   Resp 18   Ht '5\' 6"'$  (1.676 m)   Wt 192 lb 9.6 oz (87.4 kg)   SpO2 95%   BMI 31.09 kg/m  General: Awake, appears stated age HEENT: MMM Heart: RRR, no LE edema, no bruits Lungs: CTAB, no rales, wheezes or rhonchi. No accessory muscle use Psych: Age appropriate judgment and insight, normal affect and mood  Assessment and Plan: Hyperlipidemia, unspecified hyperlipidemia type - Plan: Comprehensive metabolic panel, Lipid panel  Hypothyroidism, unspecified type - Plan: TSH  Anxiety - Plan: venlafaxine XR (EFFEXOR XR) 150 MG 24 hr capsule  Muscle cramping - Plan: methocarbamol (ROBAXIN) 500 MG tablet, Magnesium  Chronic, stable. Cont Lipitor 10 mg/d. Will take a 2 week drug holiday if below tx doesn't help. Counseled on diet/exercise. Chronic, stable. Cont levothyroxine 125 mcg/d. Chronic, stable. Cont Effexor XR 150 mg/d, Xanax prn. She is using the Xanax less often. Stay hydrated. Ck labs. If no improvement w new muscle relaxer, will take 2 week drug holiday from Lipitor. Will ck w sports med to see ideal referral options.  F/u in 6 mo or prn. The patient voiced understanding and agreement to the plan.  [place], DO 10/09/21  8:03 AM

## 2021-10-09 NOTE — Patient Instructions (Addendum)
Give [place] 2-3 business days to get the results of your labs back.   In a few days the new muscle relaxer should be effective. If not, stop the Lipitor for 2 weeks and then send me a message updating me.  Keep the diet clean and stay active.  Let us know if you need anything.

## 2021-10-13 ENCOUNTER — Encounter: Payer: Self-pay | Admitting: Family Medicine

## 2021-10-16 ENCOUNTER — Encounter: Payer: Self-pay | Admitting: Family Medicine

## 2021-10-16 ENCOUNTER — Ambulatory Visit (INDEPENDENT_AMBULATORY_CARE_PROVIDER_SITE_OTHER): Payer: Medicare Other | Admitting: Family Medicine

## 2021-10-16 VITALS — BP 138/76 | HR 93 | Ht 66.0 in | Wt 192.4 lb

## 2021-10-16 DIAGNOSIS — L309 Dermatitis, unspecified: Secondary | ICD-10-CM | POA: Diagnosis not present

## 2021-10-16 MED ORDER — NYSTATIN 100000 UNIT/GM EX CREA: 1.0000 | TOPICAL_CREAM | Freq: Two times a day (BID) | CUTANEOUS | 0 refills | Status: DC

## 2021-10-16 NOTE — Progress Notes (Signed)
   Acute Office Visit  Subjective:     Patient ID: Elaine Buck, female    DOB: Mar 28, 1955, 66 y.o.   MRN: [id number]  CC: rash under right breast   HPI Patient is in today for rash under right breast.  Patient states that a few days ago she was sweating a lot (summer heat, coming in and out of the house, etc). She then noticed a rash under her right breast that is red and itchy. States it looks like a diaper rash. She is not having any pain, fevers, chills, nipple changes, breast lumps, drainage, lesions etc.    ROS All review of systems negative except what is listed in the HPI      Objective:    BP 138/76   Pulse 93   Ht '5\' 6"'$  (1.676 m)   Wt 192 lb 6.4 oz (87.3 kg)   BMI 31.05 kg/m    Physical Exam Vitals reviewed.  Constitutional:      General: She is not in acute distress.    Appearance: Normal appearance. She is not ill-appearing.  Skin:    Capillary Refill: Capillary refill takes less than 2 seconds.     Findings: Rash present.     Comments: Right breast fold with erythematous, papular rash; no streaking, edema, warmth, abrasions/excoriations or drainage  Neurological:     General: No focal deficit present.     Mental Status: She is alert and oriented to person, place, and time.  Psychiatric:        Mood and Affect: Mood normal.        Behavior: Behavior normal.        Thought Content: Thought content normal.        Judgment: Judgment normal.        No results found for any visits on 10/16/21.      Assessment & Plan:   Problem List Items Addressed This Visit   None Visit Diagnoses     Dermatitis    -  Primary Area looks more like a heat rash or moisture dermatitis right now. No signs of infection. Recommend starting with a few days of conservative measures. Keep area clean, pat dry, can try diaper cream (zinc oxide), open to air out as much as able. If not noticing some improvement after a few days, can try nystatin cream for potential yeast  infection. Patient aware of signs/symptoms requiring further/urgent evaluation.     Relevant Medications   nystatin cream (MYCOSTATIN)       Meds ordered this encounter  Medications   nystatin cream (MYCOSTATIN)    Sig: Apply 1 Application topically 2 (two) times daily.    Dispense:  30 g    Refill:  0    Order Specific Question:   Supervising Provider    Answer:   Penni Homans A [[id number]]    Return if symptoms worsen or fail to improve.  Terrilyn Saver, NP

## 2021-10-20 ENCOUNTER — Ambulatory Visit: Payer: Medicare Other | Admitting: Family Medicine

## 2021-10-20 ENCOUNTER — Encounter: Payer: Self-pay | Admitting: Family Medicine

## 2021-10-20 ENCOUNTER — Other Ambulatory Visit: Payer: Self-pay | Admitting: Family Medicine

## 2021-10-20 DIAGNOSIS — F419 Anxiety disorder, unspecified: Secondary | ICD-10-CM

## 2021-10-20 MED ORDER — VENLAFAXINE HCL ER 150 MG PO CP24: 150.0000 mg | ORAL_CAPSULE | Freq: Every day | ORAL | 3 refills | Status: DC

## 2021-10-20 MED ORDER — LOVASTATIN 10 MG PO TABS: 10.0000 mg | ORAL_TABLET | Freq: Every day | ORAL | 2 refills | Status: DC

## 2021-11-11 ENCOUNTER — Ambulatory Visit (HOSPITAL_BASED_OUTPATIENT_CLINIC_OR_DEPARTMENT_OTHER): Payer: Medicare Other

## 2021-11-14 ENCOUNTER — Ambulatory Visit: Payer: Medicare Other | Admitting: Podiatry

## 2021-11-14 ENCOUNTER — Ambulatory Visit: Payer: Medicare Other

## 2021-11-14 DIAGNOSIS — M7752 Other enthesopathy of left foot: Secondary | ICD-10-CM | POA: Diagnosis not present

## 2021-11-14 DIAGNOSIS — M2042 Other hammer toe(s) (acquired), left foot: Secondary | ICD-10-CM | POA: Diagnosis not present

## 2021-11-14 DIAGNOSIS — M2032 Hallux varus (acquired), left foot: Secondary | ICD-10-CM

## 2021-11-17 ENCOUNTER — Encounter (HOSPITAL_BASED_OUTPATIENT_CLINIC_OR_DEPARTMENT_OTHER): Payer: Self-pay

## 2021-11-17 ENCOUNTER — Ambulatory Visit (HOSPITAL_BASED_OUTPATIENT_CLINIC_OR_DEPARTMENT_OTHER)
Admission: RE | Admit: 2021-11-17 | Discharge: 2021-11-17 | Disposition: A | Discharge status: Home / Self Care | Payer: Medicare Other | Source: Ambulatory Visit | Attending: Family Medicine | Admitting: Family Medicine

## 2021-11-17 DIAGNOSIS — Z1231 Encounter for screening mammogram for malignant neoplasm of breast: Secondary | ICD-10-CM | POA: Insufficient documentation

## 2021-11-17 NOTE — Progress Notes (Signed)
Subjective: Chief Complaint  Patient presents with   Foot Pain    Patient states that she is still experiencing left foot pain after surgery. DOS was February 2023.Patient states that she there is great toe pain and a knot that is forming on the 2nd toe. Patient states that she has been icing and soaking her foot to help with the pain and that hasn't really helped too much. Patient has stopped the gabapentin as well    66 year old female with above concerns.  She states that she is doing much better from the nerve standpoint and the nerves have calmed down.  She is off of the gabapentin as well.  Her main concern is that the big toe is turning she has also developed a knot of the second toe.  She is also describing some pinpoint on the first MPJ.  No recent injury or changes.  She states that she is still experiencing a tightening sensation she has difficulty wearing shoes.  At this time she wants to proceed with further surgery.  Objective: AAO x3, NAD DP/PT pulses palpable bilaterally, CRT less than 3 seconds Hallux varus noted on the left side there is tenderness on the first IPJ.  There is reduced range of motion of first MPJ.  Hammertoe deformity noted of the second toe.  There is tenderness palpation to the hallux as well as the second toe.  No discomfort of the other lesser digits.  There is incisions from prior surgery well-healed. No pain with calf compression, swelling, warmth, erythema  Assessment: Hallux varus, hallux rigidus status post implant arthroplasty, hammertoe second digit  Plan: -All treatment options discussed with the patient including all alternatives, risks, complications.  -X-rays were obtained and reviewed of the left foot.  3 views were obtained.  Status post first MPJ implant arthroplasty as well as plans IPJ fusion with hardware intact.  Hammertoes noted. -With a long discussion regards to further surgery.  She is aware that she is at further risk of CRPS and  further nerve issues with another surgery however given her pain she wants to proceed.  This time clinically the nerves are doing much better and her symptoms from that standpoint are much improved.  Discussed different options for surgery and I would recommend fusion of the first MPJ with bone graft as well as revision of the hallux IPJ fusion, second digit PIPJ fusion.  After discussion she wants to proceed with this.  She is well aware of the risks. -The incision placement as well as the postoperative course was discussed with the patient. I discussed risks of the surgery which include, but not limited to, infection, bleeding, pain, swelling, need for further surgery, delayed or nonhealing, painful or ugly scar, numbness or sensation changes, over/under correction, recurrence, transfer lesions, further deformity, hardware failure, DVT/PE, loss of toe/foot. Patient understands these risks and wishes to proceed with surgery. The surgical consent was reviewed with the patient all 3 pages were signed. No promises or guarantees were given to the outcome of the procedure. All questions were answered to the best of my ability. Before the surgery the patient was encouraged to call the office if there is any further questions. The [facility] will be performed at the [facility] on an outpatient basis. -Patient encouraged to call the office with any questions, concerns, change in symptoms.   [name]

## 2021-11-24 ENCOUNTER — Encounter: Payer: Self-pay | Admitting: Podiatry

## 2021-11-25 ENCOUNTER — Telehealth: Payer: Self-pay | Admitting: Urology

## 2021-11-25 ENCOUNTER — Other Ambulatory Visit: Payer: Self-pay | Admitting: Podiatry

## 2021-11-25 MED ORDER — TOPIRAMATE 25 MG PO TABS: 50.0000 mg | ORAL_TABLET | Freq: Every evening | ORAL | 0 refills | Status: DC

## 2021-11-25 NOTE — Telephone Encounter (Signed)
DOS - 12/17/21  REMOVAL HARDWARE LEFT --- 20680 HAMMERTOE REPAIR 2ND LEFT --- 30076 HALLUX IPJ FUSION LEFT --- 22633 HALLUX MPJ FUSION LEFT --- 35456 BONE GRAFT LEFT --- 20932  [facility] EFFECTIVE DATE - 03/23/21  PLAN DEDUCTIBLE - $0.00 OUT OF POCKET - $4,500.00 W/ $2,563.89 REMAINING COINSURANCE - 0% COPAY - $325.00   PER [employer] WEBSITE FOR CPT CODES 37342, 87681, 28760, 15726 AND 20355 Notification or Prior Authorization is not required for the requested services  Decision ID #:[id number]

## 2021-11-28 ENCOUNTER — Ambulatory Visit: Payer: Medicare Other | Admitting: Podiatry

## 2021-12-10 ENCOUNTER — Encounter: Payer: Self-pay | Admitting: Podiatry

## 2021-12-17 ENCOUNTER — Encounter: Payer: Self-pay | Admitting: Podiatry

## 2021-12-17 ENCOUNTER — Other Ambulatory Visit: Payer: Self-pay | Admitting: Podiatry

## 2021-12-17 DIAGNOSIS — M2032 Hallux varus (acquired), left foot: Secondary | ICD-10-CM | POA: Diagnosis not present

## 2021-12-17 DIAGNOSIS — G8918 Other acute postprocedural pain: Secondary | ICD-10-CM | POA: Diagnosis not present

## 2021-12-17 DIAGNOSIS — M2042 Other hammer toe(s) (acquired), left foot: Secondary | ICD-10-CM | POA: Diagnosis not present

## 2021-12-17 DIAGNOSIS — T84293A Other mechanical complication of internal fixation device of bones of foot and toes, initial encounter: Secondary | ICD-10-CM | POA: Diagnosis not present

## 2021-12-17 DIAGNOSIS — Z4889 Encounter for other specified surgical aftercare: Secondary | ICD-10-CM | POA: Diagnosis not present

## 2021-12-17 HISTORY — PX: FOOT SURGERY: SHX648

## 2021-12-17 MED ORDER — HYDROMORPHONE HCL 2 MG PO TABS: 2.0000 mg | ORAL_TABLET | ORAL | 0 refills | Status: DC | PRN

## 2021-12-17 MED ORDER — PROMETHAZINE HCL 25 MG PO TABS: 25.0000 mg | ORAL_TABLET | Freq: Three times a day (TID) | ORAL | 0 refills | Status: DC | PRN

## 2021-12-17 MED ORDER — CEPHALEXIN 500 MG PO CAPS: 500.0000 mg | ORAL_CAPSULE | Freq: Three times a day (TID) | ORAL | 0 refills | Status: DC

## 2021-12-17 NOTE — Progress Notes (Signed)
Postop medications sent 

## 2021-12-18 ENCOUNTER — Telehealth: Payer: Self-pay | Admitting: *Deleted

## 2021-12-18 NOTE — Telephone Encounter (Signed)
Returned the call to patient, no answer, left voice message of physician's recommendations to take 2 tablets every 4 hours(dilaudid)

## 2021-12-18 NOTE — Telephone Encounter (Signed)
Patient is requesting something stronger for pain, recently had surgery one day ago,pain level is not going down below 9,pain medicine, ibuprofen(200 mg) , asp(81 ), not helping. Please advise.

## 2021-12-19 ENCOUNTER — Telehealth: Payer: Self-pay | Admitting: *Deleted

## 2021-12-19 ENCOUNTER — Other Ambulatory Visit: Payer: Self-pay | Admitting: Podiatry

## 2021-12-19 MED ORDER — HYDROMORPHONE HCL 2 MG PO TABS: 2.0000 mg | ORAL_TABLET | ORAL | 0 refills | Status: DC | PRN

## 2021-12-19 NOTE — Telephone Encounter (Signed)
Patient called and was given recommendations per physician, to take 2 tablets of the Dilaudid instead of 1,verbalized understanding but said that she will run out , only has 12 tablets remaining.   She will need a refill.

## 2021-12-20 ENCOUNTER — Other Ambulatory Visit (INDEPENDENT_AMBULATORY_CARE_PROVIDER_SITE_OTHER): Payer: Medicare Other | Admitting: Podiatry

## 2021-12-20 ENCOUNTER — Other Ambulatory Visit: Payer: Self-pay | Admitting: Podiatry

## 2021-12-20 DIAGNOSIS — G8918 Other acute postprocedural pain: Secondary | ICD-10-CM

## 2021-12-20 MED ORDER — HYDROMORPHONE HCL 2 MG PO TABS: 2.0000 mg | ORAL_TABLET | ORAL | 0 refills | Status: AC | PRN

## 2021-12-20 MED ORDER — HYDROMORPHONE HCL 2 MG PO TABS: 2.0000 mg | ORAL_TABLET | ORAL | 0 refills | Status: DC | PRN

## 2021-12-20 NOTE — Progress Notes (Signed)
Spoke with patient and she said having postop pain, states pharmacy would not refill rx for dilaudid. Will call pharmacy and ensure rx for dilaudid '2mg'$  tablets ok to fill. Rx sent for dialudid '2mg'$  tablets take 1-2 tablets every 4 hours as needed for severe pain. Pharmacy states they will fill. Patient aware new rx being sent and will pick up today.

## 2021-12-22 ENCOUNTER — Ambulatory Visit (INDEPENDENT_AMBULATORY_CARE_PROVIDER_SITE_OTHER): Payer: Medicare Other | Admitting: Podiatry

## 2021-12-22 ENCOUNTER — Ambulatory Visit (INDEPENDENT_AMBULATORY_CARE_PROVIDER_SITE_OTHER): Payer: Medicare Other

## 2021-12-22 DIAGNOSIS — M2042 Other hammer toe(s) (acquired), left foot: Secondary | ICD-10-CM | POA: Diagnosis not present

## 2021-12-22 DIAGNOSIS — M2032 Hallux varus (acquired), left foot: Secondary | ICD-10-CM

## 2021-12-24 ENCOUNTER — Encounter: Payer: Self-pay | Admitting: Family Medicine

## 2021-12-25 MED ORDER — LOVASTATIN 10 MG PO TABS
10.0000 mg | ORAL_TABLET | Freq: Every day | ORAL | 1 refills | Status: DC
Start: 2021-12-25 — End: 2022-01-13

## 2021-12-28 NOTE — Progress Notes (Signed)
Subjective: Chief Complaint  Patient presents with   Routine Post Op    POV #1 DOS 12/17/2021 LT FOOT REMOVAL OF HARDWARE, FUSION OF 1ST MPJ & CUTTING /REPOSITIINING OF BIG TOE, HAMMERTOE REPAIR 2ND TOE, USE OF BONE GRAFT    Elaine Buck is a 66 y.o. is seen today in office s/p left first MPJ fusion, revision second hammertoe repair preformed on 12/17/2021.  At that she still having pain but is getting a little bit better compared to what it was over the weekend.  She is been nonweightbearing.  Denies any systemic complaints such as fevers, chills, nausea, vomiting. No calf pain, chest pain, shortness of breath.   Objective: General: No acute distress, AAOx3  DP/PT pulses palpable 2/4, CRT < 3 sec to all digits.  Protective sensation intact. Motor function intact.  Left foot: Incision is well coapted without any evidence of dehiscence with sutures intact. There is no surrounding erythema, ascending cellulitis, fluctuance, crepitus, malodor, drainage/purulence. There is mild to moderate edema around the surgical site. There is  pain along the surgical site.  No other areas of tenderness to bilateral lower extremities.  No other open lesions or pre-ulcerative lesions.  No pain with calf compression, swelling, warmth, erythema.   Assessment and Plan:  Status post left foot surgery, doing well with no complications with improving pain  -Treatment options discussed including all alternatives, risks, and complications -X-rays were obtained reviewed left foot.  3 views were obtained.  There is no evidence of acute fracture.  Status post first MTPJ arthrodesis with bone graft.  Hammertoe repair of the second digit. -Remain in cam boot, nonweightbearing -Ice/elevation -Pain medication as needed. -Monitor for any clinical signs or symptoms of infection and DVT/PE and directed to call the office immediately should any occur or go to the ER. -Follow-up as scheduled for possible suture removal or  sooner if any problems arise. In the meantime, encouraged to call the office with any questions, concerns, change in symptoms.   Celesta Gentile, DPM

## 2022-01-01 ENCOUNTER — Ambulatory Visit (INDEPENDENT_AMBULATORY_CARE_PROVIDER_SITE_OTHER): Payer: Medicare Other | Admitting: Podiatry

## 2022-01-01 ENCOUNTER — Other Ambulatory Visit: Payer: Medicare Other

## 2022-01-01 DIAGNOSIS — M2032 Hallux varus (acquired), left foot: Secondary | ICD-10-CM

## 2022-01-01 DIAGNOSIS — M2042 Other hammer toe(s) (acquired), left foot: Secondary | ICD-10-CM

## 2022-01-04 NOTE — Progress Notes (Signed)
Subjective: No chief complaint on file.   Elaine Buck is a 66 y.o. is seen today in office s/p left first MPJ fusion, revision second hammertoe repair preformed on 12/17/2021.  Overall she said that she is doing much better.  She is off of the narcotics.  She is getting some spasms in her left foot 1 night but has not reoccurred.  She presents today for possible suture removal.  Denies any fevers or chills.  No chest pain shortness of breath.  Objective: General: No acute distress, AAOx3  DP/PT pulses palpable 2/4, CRT < 3 sec to all digits.  Protective sensation intact. Motor function intact.  Left foot: Incision is well coapted without any evidence of dehiscence with sutures intact.  There is decreased edema.  No erythema or warmth.  No drainage or pus.  Small amount of dried blood on the bandage and it is coming from the distal portion of the incision but there is no definitive opening noted.  There was some macerated tissue distally and she had a Band-Aid on this area. No other open lesions or pre-ulcerative lesions.  No pain with calf compression, swelling, warmth, erythema.      Assessment and Plan:  Status post left foot surgery, doing well with no complications with improving pain  -Treatment options discussed including all alternatives, risks, and complications -Sutures were removed today.  Incisions coapted.  Small mount of Betadine was applied followed by dressing.  She will keep the dressing clean, dry, intact. -Remain in cam boot, nonweightbearing -Ice/elevation -Pain medication as needed. -Discussed other over-the-counter homeopathic methods to help with the spasm and cramping in the foot if needed. -Monitor for any clinical signs or symptoms of infection and DVT/PE and directed to call the office immediately should any occur or go to the ER. -Follow-up as scheduled for possible suture removal or sooner if any problems arise. In the meantime, encouraged to call the office  with any questions, concerns, change in symptoms.   Celesta Gentile, DPM

## 2022-01-08 ENCOUNTER — Ambulatory Visit: Payer: Medicare Other | Admitting: Podiatry

## 2022-01-08 DIAGNOSIS — M21611 Bunion of right foot: Secondary | ICD-10-CM

## 2022-01-13 MED ORDER — LOVASTATIN 10 MG PO TABS
10.0000 mg | ORAL_TABLET | Freq: Every day | ORAL | 1 refills | Status: DC
Start: 2022-01-13 — End: 2022-01-20

## 2022-01-13 NOTE — Addendum Note (Signed)
Addended by: Sharon Seller B on: 01/13/2022 06:58 AM   Modules accepted: Orders

## 2022-01-15 ENCOUNTER — Ambulatory Visit (INDEPENDENT_AMBULATORY_CARE_PROVIDER_SITE_OTHER): Payer: Medicare Other

## 2022-01-15 ENCOUNTER — Ambulatory Visit (INDEPENDENT_AMBULATORY_CARE_PROVIDER_SITE_OTHER): Payer: Medicare Other | Admitting: Podiatry

## 2022-01-15 DIAGNOSIS — M2032 Hallux varus (acquired), left foot: Secondary | ICD-10-CM

## 2022-01-15 DIAGNOSIS — G8918 Other acute postprocedural pain: Secondary | ICD-10-CM

## 2022-01-15 DIAGNOSIS — M2042 Other hammer toe(s) (acquired), left foot: Secondary | ICD-10-CM

## 2022-01-19 NOTE — Progress Notes (Signed)
Subjective: Chief Complaint  Patient presents with   Routine Post Op    POV #3 left foot MPJ fusion hammertoe repair, patient denes ny pain or any N/V/F/C/SOB , TX: cam boot Knee scooter, X-Rays taken today       Elaine Buck is a 66 y.o. is seen today in office s/p left first MPJ fusion, revision second hammertoe repair preformed on 12/17/2021.  States usually doing well.  Not have any significant pain.  She has not been having nerve issues as well.  She denies cam boot, nonweightbearing.  No recent injuries that she reports.  No fevers or chills.  No calf pain, chest pain, shortness of breath.  No other concerns.  Objective: General: No acute distress, AAOx3  DP/PT pulses palpable 2/4, CRT < 3 sec to all digits.  Protective sensation intact. Motor function intact.  Left foot: Incision is well coapted without any evidence of dehiscence and some scabbing still present on the incision mostly distally.  There is slight edema there is no erythema or warmth there is no drainage or pus.  There is no fluctuance or crepitation.  Toes are rectus.  K wire intact.  No drainage.  No pain with calf compression, swelling, warmth, erythema.    Assessment and Plan:  Status post left foot surgery, doing well with no complications with improving pain  -Treatment options discussed including all alternatives, risks, and complications -X-rays obtained reviewed.  3 views of the foot were obtained.  Hardware intact as well as the graft without any complicating factors. -Incision patient healing well and small scab is present there is no evidence of dehiscence or signs of infection.  Small amount of antibiotic ointment was applied followed by dressing.  Keep the dressing clean, dry, intact for now but she change the dressing to make sure is healing appropriately. -Remain in cam boot, nonweightbearing -Ice/elevation -Pain medication as needed. -Discussed other over-the-counter homeopathic methods to help with  the spasm and cramping in the foot if needed. -Monitor for any clinical signs or symptoms of infection and DVT/PE and directed to call the office immediately should any occur or go to the ER. -Follow-up as scheduled for possible pin removal or sooner if any problems arise. In the meantime, encouraged to call the office with any questions, concerns, change in symptoms.   *X-ray next appointment  Celesta Gentile, DPM

## 2022-01-20 ENCOUNTER — Other Ambulatory Visit: Payer: Self-pay | Admitting: Family Medicine

## 2022-01-22 ENCOUNTER — Other Ambulatory Visit: Payer: Self-pay | Admitting: Podiatry

## 2022-01-22 DIAGNOSIS — M2042 Other hammer toe(s) (acquired), left foot: Secondary | ICD-10-CM

## 2022-01-22 DIAGNOSIS — M2032 Hallux varus (acquired), left foot: Secondary | ICD-10-CM

## 2022-01-22 DIAGNOSIS — G8918 Other acute postprocedural pain: Secondary | ICD-10-CM

## 2022-01-29 ENCOUNTER — Ambulatory Visit (INDEPENDENT_AMBULATORY_CARE_PROVIDER_SITE_OTHER): Payer: Medicare Other | Admitting: Podiatry

## 2022-01-29 ENCOUNTER — Ambulatory Visit (INDEPENDENT_AMBULATORY_CARE_PROVIDER_SITE_OTHER): Payer: Medicare Other

## 2022-01-29 DIAGNOSIS — M2032 Hallux varus (acquired), left foot: Secondary | ICD-10-CM

## 2022-01-29 DIAGNOSIS — M2042 Other hammer toe(s) (acquired), left foot: Secondary | ICD-10-CM

## 2022-01-29 DIAGNOSIS — Z9889 Other specified postprocedural states: Secondary | ICD-10-CM

## 2022-01-29 MED ORDER — TOPIRAMATE 25 MG PO TABS
50.0000 mg | ORAL_TABLET | Freq: Every day | ORAL | 0 refills | Status: DC
Start: 2022-01-29 — End: 2022-03-18

## 2022-01-29 NOTE — Progress Notes (Signed)
Subjective: Chief Complaint  Patient presents with   Routine Post Op    Rm 3 POV #4 left foot MPJ fusion hammertoe repair. Pt getting pin removal today folowed by xrays. Pt state not pain, NV, fever or chills.     Elaine Buck is a 66 y.o. is seen today in office s/p left first MPJ fusion, revision second hammertoe repair preformed on 12/17/2021.  States that she is doing well.  She presents today for pin removal.  She has been nonweightbearing.  Denies any fevers or chills today.  Objective: General: No acute distress, AAOx3  DP/PT pulses palpable 2/4, CRT < 3 sec to all digits.  Protective sensation intact. Motor function intact.  Left foot: Incision is well coapted without any evidence of dehiscence and some scabbing still present on the incision mostly distally.  This is getting better.  There is no dehiscence of the wound and there is no drainage or pus.  Minimal edema.  No erythema or warmth.  K wires intact with any drainage.  Toes are rectus position. No pain with calf compression, swelling, warmth, erythema.    Assessment and Plan:  Status post left foot surgery, doing well with no complications with improving pain  -Treatment options discussed including all alternatives, risks, and complications -To the K wire was removed after cleaning the K wires.  Should be removed in total without complications.  After that K wires removed I obtain x-rays of the foot.  3 views were obtained.  Hardware intact any complicating factors.  Discussed at this point she can start to transition to partial weightbearing with stiffness remain in the cam boot.  Continue ice, elevate.  We will start physical therapy and referral for [facility] physical therapy at [facility] faxed.  Continue ice, elevate as well as compression to help with any residual edema.  Return in about 2 weeks (around 02/12/2022) for post-op, x-ray .   [name]

## 2022-02-02 ENCOUNTER — Encounter: Payer: Self-pay | Admitting: Podiatry

## 2022-02-02 NOTE — Telephone Encounter (Signed)
Can you refax? Thanks!

## 2022-02-06 DIAGNOSIS — M25672 Stiffness of left ankle, not elsewhere classified: Secondary | ICD-10-CM | POA: Diagnosis not present

## 2022-02-06 DIAGNOSIS — M6281 Muscle weakness (generalized): Secondary | ICD-10-CM | POA: Diagnosis not present

## 2022-02-06 DIAGNOSIS — R2689 Other abnormalities of gait and mobility: Secondary | ICD-10-CM | POA: Diagnosis not present

## 2022-02-06 DIAGNOSIS — M25572 Pain in left ankle and joints of left foot: Secondary | ICD-10-CM | POA: Diagnosis not present

## 2022-02-09 DIAGNOSIS — R2689 Other abnormalities of gait and mobility: Secondary | ICD-10-CM | POA: Diagnosis not present

## 2022-02-09 DIAGNOSIS — M6281 Muscle weakness (generalized): Secondary | ICD-10-CM | POA: Diagnosis not present

## 2022-02-09 DIAGNOSIS — M25572 Pain in left ankle and joints of left foot: Secondary | ICD-10-CM | POA: Diagnosis not present

## 2022-02-09 DIAGNOSIS — M25672 Stiffness of left ankle, not elsewhere classified: Secondary | ICD-10-CM | POA: Diagnosis not present

## 2022-02-10 ENCOUNTER — Ambulatory Visit (INDEPENDENT_AMBULATORY_CARE_PROVIDER_SITE_OTHER): Payer: Medicare Other

## 2022-02-10 ENCOUNTER — Ambulatory Visit (INDEPENDENT_AMBULATORY_CARE_PROVIDER_SITE_OTHER): Payer: Medicare Other | Admitting: Podiatry

## 2022-02-10 DIAGNOSIS — M2032 Hallux varus (acquired), left foot: Secondary | ICD-10-CM

## 2022-02-10 DIAGNOSIS — M2042 Other hammer toe(s) (acquired), left foot: Secondary | ICD-10-CM

## 2022-02-10 MED ORDER — CEPHALEXIN 500 MG PO CAPS: 500.0000 mg | ORAL_CAPSULE | Freq: Three times a day (TID) | ORAL | 0 refills | Status: DC

## 2022-02-13 DIAGNOSIS — M25572 Pain in left ankle and joints of left foot: Secondary | ICD-10-CM | POA: Diagnosis not present

## 2022-02-13 DIAGNOSIS — M25672 Stiffness of left ankle, not elsewhere classified: Secondary | ICD-10-CM | POA: Diagnosis not present

## 2022-02-13 DIAGNOSIS — M6281 Muscle weakness (generalized): Secondary | ICD-10-CM | POA: Diagnosis not present

## 2022-02-13 DIAGNOSIS — R2689 Other abnormalities of gait and mobility: Secondary | ICD-10-CM | POA: Diagnosis not present

## 2022-02-16 DIAGNOSIS — R2689 Other abnormalities of gait and mobility: Secondary | ICD-10-CM | POA: Diagnosis not present

## 2022-02-16 DIAGNOSIS — M25672 Stiffness of left ankle, not elsewhere classified: Secondary | ICD-10-CM | POA: Diagnosis not present

## 2022-02-16 DIAGNOSIS — M25572 Pain in left ankle and joints of left foot: Secondary | ICD-10-CM | POA: Diagnosis not present

## 2022-02-16 DIAGNOSIS — M6281 Muscle weakness (generalized): Secondary | ICD-10-CM | POA: Diagnosis not present

## 2022-02-17 ENCOUNTER — Other Ambulatory Visit: Payer: Self-pay | Admitting: Podiatry

## 2022-02-17 DIAGNOSIS — M2032 Hallux varus (acquired), left foot: Secondary | ICD-10-CM

## 2022-02-17 DIAGNOSIS — M2042 Other hammer toe(s) (acquired), left foot: Secondary | ICD-10-CM

## 2022-02-17 NOTE — Progress Notes (Signed)
Subjective: Chief Complaint  Patient presents with   Routine Post Op    POV # 5 Pateintr came in today for left foot bunion reprair patient denies any pain, NO N/V/F/C/SOB, there is some swelling and redness, and some numbness, X-Rays taken today, TX: Cam boot, knee scooter      Elaine Buck is a 66 y.o. is seen today in office s/p left first MPJ fusion, revision second hammertoe repair preformed on 12/17/2021.  She states she been doing well.  She has been doing physical therapy.  No significant pain but having some nerve symptoms starting to come back.  She denies any fevers or chills.   Objective: General: No acute distress, AAOx3  DP/PT pulses palpable 2/4, CRT < 3 sec to all digits.  Protective sensation intact. Motor function intact.  Left foot: Incision is well coapted without any evidence of dehiscence and some scabbing still present on the incision mostly distally.  Sutures became somewhat smaller.  There is some localized erythema to the hallux but this is faint and there is no increased temperature.  There is no drainage or pus.  No fluctuation or crepitation.  There is no malodor.  Toe is rectus. No pain with calf compression, swelling, warmth, erythema.    Assessment and Plan:  Status post left foot surgery,   -Treatment options discussed including all alternatives, risks, and complications -X-rays were obtained reviewed.  3 views were obtained.  Hardware intact and bone graft intact.  Hammertoe repair present.  No evidence of acute fracture. -Discussed partial weightbearing in cam boot as tolerated.  As she is comfortable and we can start her walk as tolerated in the cam boot.  Continue ice and elevate as well as compression.  I still recommend wearing a regular shoe until the scab is completely come off and the scar is well-healed on the incision.  Given the localized erythema start cephalexin as a precaution.  Monitor for any signs or symptoms of infection.  [name]

## 2022-02-19 ENCOUNTER — Encounter: Payer: Self-pay | Admitting: Podiatry

## 2022-02-20 ENCOUNTER — Other Ambulatory Visit: Payer: Self-pay | Admitting: Podiatry

## 2022-02-20 MED ORDER — CELECOXIB 100 MG PO CAPS: 100.0000 mg | ORAL_CAPSULE | Freq: Two times a day (BID) | ORAL | 0 refills | Status: DC

## 2022-02-23 ENCOUNTER — Other Ambulatory Visit: Payer: Self-pay

## 2022-02-23 ENCOUNTER — Ambulatory Visit (INDEPENDENT_AMBULATORY_CARE_PROVIDER_SITE_OTHER): Payer: Medicare Other | Admitting: Podiatry

## 2022-02-23 ENCOUNTER — Encounter: Payer: Self-pay | Admitting: Podiatry

## 2022-02-23 ENCOUNTER — Ambulatory Visit (INDEPENDENT_AMBULATORY_CARE_PROVIDER_SITE_OTHER): Payer: Medicare Other

## 2022-02-23 VITALS — BP 116/79 | HR 84 | Temp 97.9°F | Resp 18

## 2022-02-23 DIAGNOSIS — T8130XA Disruption of wound, unspecified, initial encounter: Secondary | ICD-10-CM

## 2022-02-23 DIAGNOSIS — M2042 Other hammer toe(s) (acquired), left foot: Secondary | ICD-10-CM

## 2022-02-23 DIAGNOSIS — L03032 Cellulitis of left toe: Secondary | ICD-10-CM

## 2022-02-23 DIAGNOSIS — M2022 Hallux rigidus, left foot: Secondary | ICD-10-CM

## 2022-02-23 DIAGNOSIS — M79672 Pain in left foot: Secondary | ICD-10-CM | POA: Diagnosis not present

## 2022-02-23 MED ORDER — CIPROFLOXACIN HCL 500 MG PO TABS: 500.0000 mg | ORAL_TABLET | Freq: Two times a day (BID) | ORAL | 0 refills | Status: DC

## 2022-02-23 MED ORDER — AMOXICILLIN-POT CLAVULANATE 875-125 MG PO TABS: 1.0000 | ORAL_TABLET | Freq: Two times a day (BID) | ORAL | 0 refills | Status: DC

## 2022-02-23 NOTE — Progress Notes (Signed)
Orders placed from [facility]. Patient sent from their office to have labs drawn at [facility]

## 2022-02-24 LAB — CBC WITH DIFFERENTIAL
Basophils Absolute: 0 10*3/uL (ref 0.0–0.2)
Basos: 1 %
EOS (ABSOLUTE): 0.1 10*3/uL (ref 0.0–0.4)
Eos: 2 %
Hematocrit: 43.2 % (ref 34.0–46.6)
Hemoglobin: 14 g/dL (ref 11.1–15.9)
Immature Grans (Abs): 0 10*3/uL (ref 0.0–0.1)
Immature Granulocytes: 0 %
Lymphocytes Absolute: 1.4 10*3/uL (ref 0.7–3.1)
Lymphs: 31 %
MCH: 28 pg (ref 26.6–33.0)
MCHC: 32.4 g/dL (ref 31.5–35.7)
MCV: 86 fL (ref 79–97)
Monocytes Absolute: 0.6 10*3/uL (ref 0.1–0.9)
Monocytes: 12 %
Neutrophils Absolute: 2.5 10*3/uL (ref 1.4–7.0)
Neutrophils: 54 %
RBC: 5 x10E6/uL (ref 3.77–5.28)
RDW: 12.2 % (ref 11.7–15.4)
WBC: 4.6 10*3/uL (ref 3.4–10.8)

## 2022-02-24 LAB — C-REACTIVE PROTEIN: CRP: 4 mg/L (ref 0–10)

## 2022-02-24 LAB — SEDIMENTATION RATE: Sed Rate: 7 mm/hr (ref 0–40)

## 2022-02-25 ENCOUNTER — Encounter: Payer: Medicare Other | Admitting: Family Medicine

## 2022-02-25 NOTE — Progress Notes (Signed)
Subjective: Chief Complaint  Patient presents with   Post-op Problem    Patient came in today for a left hammer toe post op, patient states she has been seeing some drainage yesterday, redness and swelling, patient denies any pain, patient denies any N/V/F/C/SOB TX: Cam boot, X-Rays taken today    Wound Check    Elaine Buck is a 66 y.o. is seen today in office s/p left first MPJ fusion, revision second hammertoe repair preformed on 12/17/2021.  She presents today for an acute appointment as the scabs come off and she is concerned about infection.  She states that she normally showers at night and wants the area dry however yesterday she noticed that she showered in the morning, let it dry then apply antibiotic ointment and a bandage and later on she has a scab has come off.  This morning toe was red.  She was concerned and she contacted me through [facility] and advised her to come to the office.  Denies any fevers or chills.   Objective: General: No acute distress, AAOx3  DP/PT pulses palpable 2/4, CRT < 3 sec to all digits.  Protective sensation intact. Motor function intact.  Left foot: Incision is well coapted without any evidence of dehiscence on the proximal aspect, the distal aspect of the wound is present with a scab has come off.  Fibrotic tissue is present on not able to appreciate any purulence today.  There is no exposed bone or hardware at this time.  No exposed tendon.  There is edema and erythema present to the hallux.  There is no fluctuation or crepitation.  Some scabbing still noted along the wound.  There is no malodor. No pain with calf compression, swelling, warmth, erythema.     Assessment and Plan:  Status post left foot surgery, dehiscence  -Treatment options discussed including all alternatives, risks, and complications -X-rays were obtained reviewed.  3 views were obtained.  Hardware intact and bone graft intact.  Hammertoe repair present.  No evidence of acute  fracture.  No complicating factors noted at this time. -Concerned that the open wound t given the close proximity to the hardware but not able to identify or see any hardware or bone.  Today Betadine was applied followed by dressing.  Continue with daily dressing changes.  I want her to also return to the knee scooter to stay off the foot, ice, elevate as well as compression help with any edema.  Will remain in the cam boot as well. -Prescribed Augmentin and Cipro -Blood work ordered including sed rate, CRP, CBC -I did not take a culture today as there is no drainage and this would have been a superficial swab.   -Monitor for any clinical signs or symptoms of infection and directed to call the office immediately should any occur or go to the ER.  Follow-up next week as scheduled or sooner if needed.  [name]

## 2022-03-05 ENCOUNTER — Ambulatory Visit (INDEPENDENT_AMBULATORY_CARE_PROVIDER_SITE_OTHER): Payer: Medicare Other | Admitting: Podiatry

## 2022-03-05 DIAGNOSIS — T8130XA Disruption of wound, unspecified, initial encounter: Secondary | ICD-10-CM

## 2022-03-05 MED ORDER — CIPROFLOXACIN HCL 500 MG PO TABS
500.0000 mg | ORAL_TABLET | Freq: Two times a day (BID) | ORAL | 0 refills | Status: DC
Start: 2022-03-05 — End: 2022-03-18

## 2022-03-05 MED ORDER — AMOXICILLIN-POT CLAVULANATE 875-125 MG PO TABS: 1.0000 | ORAL_TABLET | Freq: Two times a day (BID) | ORAL | 0 refills | Status: DC

## 2022-03-05 NOTE — H&P (View-Only) (Signed)
Subjective: Chief Complaint  Patient presents with   Wound Check    Left foot wound check hallux, patient denies any pain, no redness,  No N/V/F/C/SOB TX: Cam Boot, Cipro and Augmentin     Elaine Buck is a 66 y.o. is seen today in office s/p left first MPJ fusion, revision second hammertoe repair preformed on 12/17/2021.  Last appointment she was found to have dehiscence of the wound as the scabs come off and she was placed on Augmentin, Cipro.  She presents today for follow-up evaluation of the wound.  She has been keeping Betadine on the wound she states the scabs come off twice.  No purulence.  No fevers or chills.    Objective: General: No acute distress, AAOx3  DP/PT pulses palpable 2/4, CRT < 3 sec to all digits.  Protective sensation intact. Motor function intact.  Left foot: Incision is well coapted without any evidence of dehiscence on the proximal aspect, the distal aspect of the wound is present with a scab has come off.  The wound itself measures about 1 to 1.5 cm.  There is a thin layer of tissue overlying the plate but able to palpate complete.  The edema and erythema to the toe is actually improved.  There is no drainage or pus.  There is no fluctuance or crepitation.  There is no malodor. No pain with calf compression, swelling, warmth, erythema.    Assessment and Plan:  Status post left foot surgery, dehiscence  -Treatment options discussed including all alternatives, risks, and complications -X-rays were obtained reviewed.  3 views were obtained.  Hardware intact and bone graft intact.  Hammertoe repair present.  No evidence of acute fracture.  No complicating factors noted at this time.  Appears to be stable. -Scabs come off twice and the wound disease seem to be a lot smaller diameter and there is granulation tissue on the periphery but able to palpate to the plate.  Discussed with her Integra graft, debridement of the wound.  Will plan to do this as an outpatient  procedure.  Discussed risks of surgery and consent was signed.  Discussed about infection, possible need for removal of hardware in the future, loss of toe.  For now only to [facility] dressing changes daily.  I gave her supplies and showed her how to do this.  Continue antibiotics which I refilled today. -Monitor for any clinical signs or symptoms of infection and directed to call the office immediately should any occur or go to the ER.  No follow-ups on file.  [name]

## 2022-03-05 NOTE — Progress Notes (Signed)
Subjective: Chief Complaint  Patient presents with   Wound Check    Left foot wound check hallux, patient denies any pain, no redness,  No N/V/F/C/SOB TX: Cam Boot, Cipro and Augmentin     Elaine Buck is a 66 y.o. is seen today in office s/p left first MPJ fusion, revision second hammertoe repair preformed on 12/17/2021.  Last appointment she was found to have dehiscence of the wound as the scabs come off and she was placed on Augmentin, Cipro.  She presents today for follow-up evaluation of the wound.  She has been keeping Betadine on the wound she states the scabs come off twice.  No purulence.  No fevers or chills.    Objective: General: No acute distress, AAOx3  DP/PT pulses palpable 2/4, CRT < 3 sec to all digits.  Protective sensation intact. Motor function intact.  Left foot: Incision is well coapted without any evidence of dehiscence on the proximal aspect, the distal aspect of the wound is present with a scab has come off.  The wound itself measures about 1 to 1.5 cm.  There is a thin layer of tissue overlying the plate but able to palpate complete.  The edema and erythema to the toe is actually improved.  There is no drainage or pus.  There is no fluctuance or crepitation.  There is no malodor. No pain with calf compression, swelling, warmth, erythema.    Assessment and Plan:  Status post left foot surgery, dehiscence  -Treatment options discussed including all alternatives, risks, and complications -X-rays were obtained reviewed.  3 views were obtained.  Hardware intact and bone graft intact.  Hammertoe repair present.  No evidence of acute fracture.  No complicating factors noted at this time.  Appears to be stable. -Scabs come off twice and the wound disease seem to be a lot smaller diameter and there is granulation tissue on the periphery but able to palpate to the plate.  Discussed with her Integra graft, debridement of the wound.  Will plan to do this as an outpatient  procedure.  Discussed risks of surgery and consent was signed.  Discussed about infection, possible need for removal of hardware in the future, loss of toe.  For now only to [facility] dressing changes daily.  I gave her supplies and showed her how to do this.  Continue antibiotics which I refilled today. -Monitor for any clinical signs or symptoms of infection and directed to call the office immediately should any occur or go to the ER.  No follow-ups on file.  Elaine Buck DPM

## 2022-03-05 NOTE — Patient Instructions (Signed)
Pre-Operative Instructions  Congratulations, you have decided to take an important step to improving your quality of life.  You can be assured that the doctors of [facility] will be with you every step of the way.  Plan to be at the surgery center/hospital at least 1 (one) hour prior to your scheduled time unless otherwise directed by the surgical center/hospital staff.  You must have a responsible adult accompany you, remain during the surgery and drive you home.  Make sure you have directions to the surgical center/hospital and know how to get there on time. For hospital based surgery you will need to obtain a history and physical form from your family physician within 1 month prior to the date of surgery- we will give you a form for you primary physician.  We make every effort to accommodate the date you request for surgery.  There are however, times where surgery dates or times have to be moved.  We will contact you as soon as possible if a change in schedule is required.   No Aspirin/Ibuprofen for one week before surgery.  If you are on aspirin, any non-steroidal anti-inflammatory medications (Mobic, Aleve, Ibuprofen) you should stop taking it 7 days prior to your surgery.  You make take Tylenol  For pain prior to surgery.  Medications- If you are taking daily heart and blood pressure medications, seizure, reflux, allergy, asthma, anxiety, pain or diabetes medications, make sure the surgery center/hospital is aware before the day of surgery so they may notify you which medications to take or avoid the day of surgery. No food or drink after midnight the night before surgery unless directed otherwise by surgical center/hospital staff. No alcoholic beverages 24 hours prior to surgery.  No smoking 24 hours prior to or 24 hours after surgery. Wear loose pants or shorts- loose enough to fit over bandages, boots, and casts. No slip on shoes, sneakers are best. Bring your boot with you to the  surgery center/hospital.  Also bring crutches or a walker if your physician has prescribed it for you.  If you do not have this equipment, it will be provided for you after surgery. If you have not been contracted by the surgery center/hospital by the day before your surgery, call to confirm the date and time of your surgery. Leave-time from work may vary depending on the type of surgery you have.  Appropriate arrangements should be made prior to surgery with your employer. Prescriptions will be provided immediately following surgery by your doctor.  Have these filled as soon as possible after surgery and take the medication as directed. Remove nail polish on the operative foot. Wash the night before surgery.  The night before surgery wash the foot and leg well with the antibacterial soap provided and water paying special attention to beneath the toenails and in between the toes.  Rinse thoroughly with water and dry well with a towel.  Perform this wash unless told not to do so by your physician.  Enclosed: 1 Ice pack (please put in freezer the night before surgery)   1 Hibiclens skin cleaner   Pre-op Instructions  If you have any questions regarding the instructions, do not hesitate to call our office at any point during this process.   [place]: [address] [place], [place] [address] [phone]  [place]: [address] [place], [place] [address] [phone]  Dr. Daryle Amis, DPM  

## 2022-03-06 ENCOUNTER — Telehealth: Payer: Self-pay | Admitting: Podiatry

## 2022-03-06 NOTE — Telephone Encounter (Signed)
DOS: 03/18/2022  [employer] Medicare  Debridement of Ulcer Foot Lt (71165) Skin Graft Application Foot Lt (79038)  Deductible: $0 Out-of-Pocket: $4,500 with $2,995.37 remaining CoInsurance: 0%  Notification or Prior Authorization is not required for the requested services  Decision ID #:[id number]  The number above acknowledges your inquiry and our response. Please write this number down and refer to it for future inquiries. Coverage and payment for an item or service is governed by the member's benefit plan document, and, if applicable, the provider's participation agreement with the Health Plan.

## 2022-03-12 ENCOUNTER — Encounter: Payer: Self-pay | Admitting: Podiatry

## 2022-03-17 ENCOUNTER — Telehealth: Payer: Self-pay

## 2022-03-17 ENCOUNTER — Other Ambulatory Visit: Payer: Self-pay

## 2022-03-17 ENCOUNTER — Other Ambulatory Visit: Payer: Self-pay | Admitting: Podiatry

## 2022-03-17 ENCOUNTER — Ambulatory Visit: Payer: Medicare Other | Admitting: Family Medicine

## 2022-03-17 ENCOUNTER — Encounter (HOSPITAL_BASED_OUTPATIENT_CLINIC_OR_DEPARTMENT_OTHER): Payer: Self-pay | Admitting: Podiatry

## 2022-03-17 DIAGNOSIS — L03032 Cellulitis of left toe: Secondary | ICD-10-CM

## 2022-03-17 DIAGNOSIS — M2022 Hallux rigidus, left foot: Secondary | ICD-10-CM

## 2022-03-17 DIAGNOSIS — T8130XA Disruption of wound, unspecified, initial encounter: Secondary | ICD-10-CM

## 2022-03-17 DIAGNOSIS — M79672 Pain in left foot: Secondary | ICD-10-CM

## 2022-03-17 NOTE — Progress Notes (Signed)
Spoke w/ via phone for pre-op interview---Elaine Buck needs dos----none per anesthesia, surgeon orders pending as of 03/17/22            Buck results------02/23/22 CBC w/ diff in Epic COVID test -----patient states asymptomatic no test needed Arrive at -------[facility] on Thursday, 03/19/22 NPO after MN NO Solid Food.  Clear liquids from MN until---0745 Med rec completed Medications to take morning of surgery -----Xanax prn, Synthroid, Effexor Diabetic medication -----n/a Patient instructed no nail polish to be worn day of surgery Patient instructed to bring photo id and insurance card day of surgery Patient aware to have Driver (ride ) / caregiver    for 24 hours after surgery - husband, Elaine Buck Patient Special Instructions -----Patient is aware that partial dentures and hearing aids must be removed prior to going  to the OR. Instructed patient to do her best not to smoke for 24 hours before surgery. Pre-Op special Istructions -----Requested orders via Epic IB from Elaine Buck on 03/17/22. Patient verbalized understanding of instructions that were given at this phone interview. Patient denies shortness of breath, chest pain, fever, cough at this phone interview.  Patient has an appointment with her PCP, Elaine Buck on 03/18/22 to get her history and physical completed prior to surgery.

## 2022-03-17 NOTE — Telephone Encounter (Signed)
Pt scheduled for 03/18/22 for Physical.   Client [facility] Night - Client Client Site [facility] - Night Provider Riki Sheer- MD Contact Type Call Who Is Calling Patient / Member / Family / Caregiver Caller Name [place] Phone Number [phone] Patient Name Elaine Buck Patient DOB 10/30/1955 Call Type Message Only Information Provided Reason for Call Request to Schedule Office Appointment Initial Comment Caller states she has foot surgery scheduled for Thursday. She is needing a physical prior to the surgery. Patient request to speak to RN No Additional Comment Office hours provided Disp. Time Disposition Final User 03/17/2022 7:54:24 AM General Information Provided Yes Marice Potter Call Closed By: Marice Potter Transaction Date/Time: 03/17/2022 7:51:24 AM (ET

## 2022-03-18 ENCOUNTER — Ambulatory Visit (INDEPENDENT_AMBULATORY_CARE_PROVIDER_SITE_OTHER): Payer: Medicare Other | Admitting: Family Medicine

## 2022-03-18 ENCOUNTER — Encounter: Payer: Self-pay | Admitting: Family Medicine

## 2022-03-18 VITALS — BP 126/73 | HR 72 | Temp 98.3°F | Resp 16 | Ht 66.0 in | Wt 180.0 lb

## 2022-03-18 DIAGNOSIS — Z0000 Encounter for general adult medical examination without abnormal findings: Secondary | ICD-10-CM

## 2022-03-18 DIAGNOSIS — E039 Hypothyroidism, unspecified: Secondary | ICD-10-CM

## 2022-03-18 DIAGNOSIS — R252 Cramp and spasm: Secondary | ICD-10-CM

## 2022-03-18 DIAGNOSIS — E785 Hyperlipidemia, unspecified: Secondary | ICD-10-CM

## 2022-03-18 LAB — COMPREHENSIVE METABOLIC PANEL
ALT: 24 U/L (ref 0–35)
AST: 22 U/L (ref 0–37)
Albumin: 4.6 g/dL (ref 3.5–5.2)
Alkaline Phosphatase: 78 U/L (ref 39–117)
BUN: 15 mg/dL (ref 6–23)
CO2: 29 mEq/L (ref 19–32)
Calcium: 9.6 mg/dL (ref 8.4–10.5)
Chloride: 106 mEq/L (ref 96–112)
Creatinine, Ser: 0.95 mg/dL (ref 0.40–1.20)
GFR: 62.03 mL/min (ref >60.00)
Glucose, Bld: 96 mg/dL (ref 70–99)
Potassium: 4.7 mEq/L (ref 3.5–5.1)
Sodium: 141 mEq/L (ref 135–145)
Total Bilirubin: 0.4 mg/dL (ref 0.2–1.2)
Total Protein: 7.1 g/dL (ref 6.0–8.3)

## 2022-03-18 LAB — CBC
HCT: 43.6 % (ref 36.0–46.0)
Hemoglobin: 14.3 g/dL (ref 12.0–15.0)
MCHC: 32.8 g/dL (ref 30.0–36.0)
MCV: 86.7 fl (ref 78.0–100.0)
Platelets: 253 10*3/uL (ref 150.0–400.0)
RBC: 5.03 Mil/uL (ref 3.87–5.11)
RDW: 13.1 % (ref 11.5–15.5)
WBC: 5.8 10*3/uL (ref 4.0–10.5)

## 2022-03-18 LAB — LIPID PANEL
Cholesterol: 178 mg/dL (ref 0–200)
HDL: 51.1 mg/dL (ref >39.00)
LDL Cholesterol: 99 mg/dL (ref 0–99)
NonHDL: 126.53
Total CHOL/HDL Ratio: 3
Triglycerides: 136 mg/dL (ref 0.0–149.0)
VLDL: 27.2 mg/dL (ref 0.0–40.0)

## 2022-03-18 LAB — TSH: TSH: 0.07 u[IU]/mL — ABNORMAL LOW (ref 0.35–5.50)

## 2022-03-18 LAB — T4, FREE: Free T4: 1.01 ng/dL (ref 0.60–1.60)

## 2022-03-18 MED ORDER — METHOCARBAMOL 500 MG PO TABS: 500.0000 mg | ORAL_TABLET | Freq: Every evening | ORAL | 5 refills | Status: DC

## 2022-03-18 MED ORDER — LOVASTATIN 10 MG PO TABS: 10.0000 mg | ORAL_TABLET | Freq: Every day | ORAL | 6 refills | Status: DC

## 2022-03-18 MED ORDER — LEVOTHYROXINE SODIUM 125 MCG PO TABS: 125.0000 ug | ORAL_TABLET | Freq: Every day | ORAL | 3 refills | Status: DC

## 2022-03-18 MED ORDER — TOPIRAMATE 25 MG PO TABS: 50.0000 mg | ORAL_TABLET | Freq: Every day | ORAL | 0 refills | Status: DC

## 2022-03-18 NOTE — Progress Notes (Signed)
Chief Complaint  Patient presents with   Annual Exam     Well Woman Elaine Buck is here for a complete physical.   Her last physical was >1 year ago.  Current diet: in general, a "healthy" diet. Current exercise: some walking. Weight is decreasing and she denies daytime fatigue. Seatbelt? Yes Advanced directive? Yes  Health Maintenance Colonoscopy- Yes Shingrix- Yes DEXA- Yes Mammogram- Yes Tetanus- Yes Pneumonia- Yes Hep C screen- Yes  Preop Has debridement of the wound/ulcer on her left foot tomorrow with the podiatry team.  She has never had any personal or family history of issues with anesthesia before.  No new loose, chipped, cracked or missing teeth.  She has been range of motion of her neck.  She is able to walk up 2 flights of stairs without excessive shortness of breath or chest pain.  Past Medical History:  Diagnosis Date   Anxiety    Follows w/ PCP, Dr. Riki Sheer.   Arthritis    left toes   Cancer (HCC)    breast (Left)   Complex regional pain syndrome of left lower extremity    type 1, follows with Dr. Shawna Clamp @ [place], [place] 07/17/21   Dry eye syndrome    History of colon polyps    History of lobular carcinoma in situ (LCIS) of  left breast    s/p lumpectomy in 2005, Tamoxifen/Evista therapy for 5 years   Hyperlipidemia    Follows w/ PCP, Dr. Riki Sheer.   Hypothyroidism    Follows w/ PCP, Dr. Riki Sheer.   Leg cramping 09/2021   taking muscle relaxants at night   Neuromuscular disorder ([place])    small fiber neuropathy, L foot , follows with Dr. Kandace Blitz @ [place].   Sacroiliac joint dysfunction of right side    Thyroid disease    Wears glasses    Wears hearing aid    Wears partial dentures    upper and lower     Past Surgical History:  Procedure Laterality Date   BREAST BIOPSY  2005   BREAST LUMPECTOMY Left 05/22/2003   LCIS Cells found in breast tissue   CARPAL  TUNNEL RELEASE Bilateral    around 2003   COLONOSCOPY     04/15/2016, 11/24/2019, 01/25/2020, hx of multiple colon polyps   FOOT SURGERY Left 04/2019   joint inplantment in big toe    FOOT SURGERY Left 12/17/2021   left 1st MPJ fusion revision, 2nd hammer toe repair   TUBAL LIGATION  1986    Medications  Current Outpatient Medications on File Prior to Visit  Medication Sig Dispense Refill   ALPRAZolam (XANAX) 0.5 MG tablet Take 1 tablet (0.5 mg total) by mouth daily as needed for anxiety. 30 tablet 5   b complex vitamins capsule Take 1 capsule by mouth daily.     Calcium Carbonate-Vitamin D 600-400 MG-UNIT tablet Take by mouth.     celecoxib (CELEBREX) 100 MG capsule Take 1 capsule (100 mg total) by mouth 2 (two) times daily. (Patient taking differently: Take 100 mg by mouth 2 (two) times daily. Patient stopped on 03/15/22 for surgery on 03/19/22, will resume after surgery.) 60 capsule 0   Cholecalciferol (VITAMIN D3 PO) Take 1 tablet by mouth daily.     Multiple Vitamin (MULTIVITAMIN) tablet Take 1 tablet by mouth daily.     Omega-3 Fatty Acids (FISH OIL PO) Take 1 tablet by mouth daily.     venlafaxine XR (EFFEXOR XR)  150 MG 24 hr capsule Take 1 capsule (150 mg total) by mouth daily with breakfast. 90 capsule 3   Allergies Allergies  Allergen Reactions   Other Hives and Itching    Peaches   Prunus Persica     Review of Systems: Constitutional:  no fevers Eye:  no recent significant change in vision Ears:  No changes in hearing Nose/Mouth/Throat:  no complaints of nasal congestion, no sore throat Cardiovascular: no chest pain Respiratory:  No shortness of breath Gastrointestinal:  No change in bowel habits GU:  Female: negative for dysuria Integumentary:  no abnormal skin lesions reported Neurologic:  no headaches Endocrine:  denies unexplained weight changes  Exam BP 126/73 (BP Location: Left Arm, Patient Position: Sitting, Cuff Size: Small)   Pulse 72   Temp 98.3 F  (36.8 C) (Oral)   Resp 16   Ht '5\' 6"'$  (1.676 m)   Wt 180 lb (81.6 kg)   SpO2 96%   BMI 29.05 kg/m  General:  well developed, well nourished, in no apparent distress Skin:  no significant moles, warts, or growths Head:  no masses, lesions, or tenderness Eyes:  pupils equal and round, sclera anicteric without injection Ears:  canals without lesions, TMs shiny without retraction, no obvious effusion, no erythema Nose:  nares patent, mucosa normal, and no drainage Throat/Pharynx:  lips and gingiva without lesion; tongue and uvula midline; non-inflamed pharynx; no exudates or postnasal drainage Neck: neck supple without adenopathy, thyromegaly, or masses Lungs:  clear to auscultation, breath sounds equal bilaterally, no respiratory distress Cardio:  regular rate and rhythm, no bruits or LE edema Abdomen:  abdomen soft, nontender; bowel sounds normal; no masses or organomegaly Genital: Deferred Neuro:  gait normal; deep tendon reflexes normal and symmetric Psych: well oriented with normal range of affect and appropriate judgment/insight  Assessment and Plan  Well adult exam  Hypothyroidism, unspecified type - Plan: levothyroxine (SYNTHROID) 125 MCG tablet, TSH, T4, free  Muscle cramping - Plan: methocarbamol (ROBAXIN) 500 MG tablet  Hyperlipidemia, unspecified hyperlipidemia type - Plan: lovastatin (MEVACOR) 10 MG tablet, CBC, Comprehensive metabolic panel, Lipid panel   Well 66 y.o. female. Counseled on diet and exercise. Advanced directive form requested today.  Preop: Low cardiovascular risk for proposed procedure.  Form will be sent to the operating team as requested today. Other orders as above. Follow up in 6 mo or prn. The patient voiced understanding and agreement to the plan.  Harvey, DO 03/18/22 12:18 PM

## 2022-03-18 NOTE — Progress Notes (Signed)
Received pt's pcp H&P via fax from Dr Jacqualyn Posey office , placed in chart.

## 2022-03-18 NOTE — Patient Instructions (Addendum)
Give us 2-3 business days to get the results of your labs back.   Keep the diet clean and stay active.  Please get me a copy of your advanced directive form at your convenience.   Let us know if you need anything.  

## 2022-03-19 ENCOUNTER — Ambulatory Visit (HOSPITAL_BASED_OUTPATIENT_CLINIC_OR_DEPARTMENT_OTHER): Payer: Medicare Other | Admitting: Anesthesiology

## 2022-03-19 ENCOUNTER — Other Ambulatory Visit: Payer: Self-pay

## 2022-03-19 ENCOUNTER — Other Ambulatory Visit: Payer: Self-pay | Admitting: Podiatry

## 2022-03-19 ENCOUNTER — Ambulatory Visit (HOSPITAL_BASED_OUTPATIENT_CLINIC_OR_DEPARTMENT_OTHER)
Admission: RE | Admit: 2022-03-19 | Discharge: 2022-03-19 | Disposition: A | Discharge status: Home / Self Care | Payer: Medicare Other | Attending: Podiatry | Admitting: Podiatry

## 2022-03-19 ENCOUNTER — Encounter (HOSPITAL_BASED_OUTPATIENT_CLINIC_OR_DEPARTMENT_OTHER)
Admission: RE | Disposition: A | Discharge status: Home / Self Care | Payer: Self-pay | Source: Home / Self Care | Attending: Podiatry

## 2022-03-19 ENCOUNTER — Encounter (HOSPITAL_BASED_OUTPATIENT_CLINIC_OR_DEPARTMENT_OTHER): Payer: Self-pay | Admitting: Podiatry

## 2022-03-19 DIAGNOSIS — Z87891 Personal history of nicotine dependence: Secondary | ICD-10-CM | POA: Diagnosis not present

## 2022-03-19 DIAGNOSIS — F17210 Nicotine dependence, cigarettes, uncomplicated: Secondary | ICD-10-CM | POA: Diagnosis not present

## 2022-03-19 DIAGNOSIS — G629 Polyneuropathy, unspecified: Secondary | ICD-10-CM | POA: Diagnosis not present

## 2022-03-19 DIAGNOSIS — Z01818 Encounter for other preprocedural examination: Secondary | ICD-10-CM

## 2022-03-19 DIAGNOSIS — F418 Other specified anxiety disorders: Secondary | ICD-10-CM

## 2022-03-19 DIAGNOSIS — T8131XA Disruption of external operation (surgical) wound, not elsewhere classified, initial encounter: Secondary | ICD-10-CM

## 2022-03-19 DIAGNOSIS — E039 Hypothyroidism, unspecified: Secondary | ICD-10-CM

## 2022-03-19 DIAGNOSIS — T8130XA Disruption of wound, unspecified, initial encounter: Secondary | ICD-10-CM | POA: Diagnosis not present

## 2022-03-19 DIAGNOSIS — L97529 Non-pressure chronic ulcer of other part of left foot with unspecified severity: Secondary | ICD-10-CM | POA: Diagnosis not present

## 2022-03-19 DIAGNOSIS — M1990 Unspecified osteoarthritis, unspecified site: Secondary | ICD-10-CM | POA: Diagnosis not present

## 2022-03-19 HISTORY — DX: Presence of dental prosthetic device (complete) (partial): Z97.2

## 2022-03-19 HISTORY — DX: Hypothyroidism, unspecified: E03.9

## 2022-03-19 HISTORY — PX: GRAFT APPLICATION: SHX6696

## 2022-03-19 HISTORY — DX: Sacrococcygeal disorders, not elsewhere classified: M53.3

## 2022-03-19 HISTORY — PX: WOUND DEBRIDEMENT: SHX247

## 2022-03-19 HISTORY — DX: Dry eye syndrome of unspecified lacrimal gland: H04.129

## 2022-03-19 HISTORY — DX: Presence of spectacles and contact lenses: Z97.3

## 2022-03-19 HISTORY — DX: Presence of external hearing-aid: Z97.4

## 2022-03-19 HISTORY — DX: Complex regional pain syndrome i of left lower limb: G90.522

## 2022-03-19 SURGERY — DEBRIDEMENT, WOUND
Anesthesia: General | Laterality: Left

## 2022-03-19 MED ORDER — ONDANSETRON HCL 4 MG/2ML IJ SOLN
INTRAMUSCULAR | Status: AC
  Filled 2022-03-19: qty 2

## 2022-03-19 MED ORDER — HYDROCODONE-ACETAMINOPHEN 5-325 MG PO TABS: 1.0000 | ORAL_TABLET | Freq: Four times a day (QID) | ORAL | 0 refills | Status: DC | PRN

## 2022-03-19 MED ORDER — PHENYLEPHRINE 80 MCG/ML (10ML) SYRINGE FOR IV PUSH (FOR BLOOD PRESSURE SUPPORT)
PREFILLED_SYRINGE | INTRAVENOUS | Status: AC
  Filled 2022-03-19: qty 10

## 2022-03-19 MED ORDER — AMOXICILLIN-POT CLAVULANATE 875-125 MG PO TABS: 1.0000 | ORAL_TABLET | Freq: Two times a day (BID) | ORAL | 0 refills | Status: DC

## 2022-03-19 MED ORDER — CELECOXIB 200 MG PO CAPS
ORAL_CAPSULE | ORAL | Status: AC
  Filled 2022-03-19: qty 1

## 2022-03-19 MED ORDER — MIDAZOLAM HCL 2 MG/2ML IJ SOLN
INTRAMUSCULAR | Status: AC
  Filled 2022-03-19: qty 2

## 2022-03-19 MED ORDER — CHLORHEXIDINE GLUCONATE CLOTH 2 % EX PADS: 6.0000 | MEDICATED_PAD | Freq: Once | CUTANEOUS | Status: DC

## 2022-03-19 MED ORDER — PHENYLEPHRINE 80 MCG/ML (10ML) SYRINGE FOR IV PUSH (FOR BLOOD PRESSURE SUPPORT)
PREFILLED_SYRINGE | INTRAVENOUS | Status: DC | PRN
  Administered 2022-03-19 (×2): 80 ug via INTRAVENOUS

## 2022-03-19 MED ORDER — LIDOCAINE 2% (20 MG/ML) 5 ML SYRINGE
INTRAMUSCULAR | Status: DC | PRN
  Administered 2022-03-19: 60 mg via INTRAVENOUS

## 2022-03-19 MED ORDER — PROPOFOL 10 MG/ML IV BOLUS
INTRAVENOUS | Status: AC
  Filled 2022-03-19: qty 20

## 2022-03-19 MED ORDER — ONDANSETRON HCL 4 MG/2ML IJ SOLN
INTRAMUSCULAR | Status: DC | PRN
  Administered 2022-03-19: 4 mg via INTRAVENOUS

## 2022-03-19 MED ORDER — CELECOXIB 200 MG PO CAPS
200.0000 mg | ORAL_CAPSULE | Freq: Once | ORAL | Status: AC
  Administered 2022-03-19: 200 mg via ORAL

## 2022-03-19 MED ORDER — PROMETHAZINE HCL 25 MG PO TABS: 25.0000 mg | ORAL_TABLET | Freq: Three times a day (TID) | ORAL | 0 refills | Status: DC | PRN

## 2022-03-19 MED ORDER — 0.9 % SODIUM CHLORIDE (POUR BTL) OPTIME
TOPICAL | Status: DC | PRN
  Administered 2022-03-19: 1000 mL

## 2022-03-19 MED ORDER — ACETAMINOPHEN 500 MG PO TABS
ORAL_TABLET | ORAL | Status: AC
  Filled 2022-03-19: qty 2

## 2022-03-19 MED ORDER — LIDOCAINE-EPINEPHRINE 1 %-1:100000 IJ SOLN
INTRAMUSCULAR | Status: AC
  Filled 2022-03-19: qty 1

## 2022-03-19 MED ORDER — BUPIVACAINE HCL (PF) 0.5 % IJ SOLN
INTRAMUSCULAR | Status: AC
  Filled 2022-03-19: qty 30

## 2022-03-19 MED ORDER — ACETAMINOPHEN 500 MG PO TABS
1000.0000 mg | ORAL_TABLET | Freq: Once | ORAL | Status: AC
  Administered 2022-03-19: 1000 mg via ORAL

## 2022-03-19 MED ORDER — EPHEDRINE 5 MG/ML INJ
INTRAVENOUS | Status: AC
  Filled 2022-03-19: qty 5

## 2022-03-19 MED ORDER — OXYCODONE HCL 5 MG/5ML PO SOLN: 5.0000 mg | Freq: Once | ORAL | Status: DC | PRN

## 2022-03-19 MED ORDER — PROPOFOL 10 MG/ML IV BOLUS
INTRAVENOUS | Status: DC | PRN
  Administered 2022-03-19: 150 mg via INTRAVENOUS

## 2022-03-19 MED ORDER — CEFAZOLIN SODIUM-DEXTROSE 2-4 GM/100ML-% IV SOLN
2.0000 g | INTRAVENOUS | Status: AC
  Administered 2022-03-19: 2 g via INTRAVENOUS

## 2022-03-19 MED ORDER — MIDAZOLAM HCL 2 MG/2ML IJ SOLN
INTRAMUSCULAR | Status: DC | PRN
  Administered 2022-03-19: 2 mg via INTRAVENOUS

## 2022-03-19 MED ORDER — LACTATED RINGERS IV SOLN: INTRAVENOUS | Status: DC

## 2022-03-19 MED ORDER — EPHEDRINE SULFATE-NACL 50-0.9 MG/10ML-% IV SOSY
PREFILLED_SYRINGE | INTRAVENOUS | Status: DC | PRN
  Administered 2022-03-19: 10 mg via INTRAVENOUS

## 2022-03-19 MED ORDER — MEPERIDINE HCL 25 MG/ML IJ SOLN: 6.2500 mg | INTRAMUSCULAR | Status: DC | PRN

## 2022-03-19 MED ORDER — FENTANYL CITRATE (PF) 100 MCG/2ML IJ SOLN
INTRAMUSCULAR | Status: AC
  Filled 2022-03-19: qty 2

## 2022-03-19 MED ORDER — FENTANYL CITRATE (PF) 100 MCG/2ML IJ SOLN
INTRAMUSCULAR | Status: DC | PRN
  Administered 2022-03-19: 50 ug via INTRAVENOUS

## 2022-03-19 MED ORDER — ONDANSETRON HCL 4 MG/2ML IJ SOLN: 4.0000 mg | Freq: Once | INTRAMUSCULAR | Status: DC | PRN

## 2022-03-19 MED ORDER — CEFAZOLIN SODIUM-DEXTROSE 2-4 GM/100ML-% IV SOLN
INTRAVENOUS | Status: AC
  Filled 2022-03-19: qty 100

## 2022-03-19 MED ORDER — LIDOCAINE HCL 1 % IJ SOLN
INTRAMUSCULAR | Status: DC | PRN
  Administered 2022-03-19: 10 mL

## 2022-03-19 MED ORDER — FENTANYL CITRATE (PF) 100 MCG/2ML IJ SOLN: 25.0000 ug | INTRAMUSCULAR | Status: DC | PRN

## 2022-03-19 MED ORDER — ACETAMINOPHEN 325 MG PO TABS: 325.0000 mg | ORAL_TABLET | ORAL | Status: DC | PRN

## 2022-03-19 MED ORDER — OXYCODONE HCL 5 MG PO TABS: 5.0000 mg | ORAL_TABLET | Freq: Once | ORAL | Status: DC | PRN

## 2022-03-19 MED ORDER — LIDOCAINE HCL 1 % IJ SOLN
INTRAMUSCULAR | Status: AC
  Filled 2022-03-19: qty 20

## 2022-03-19 MED ORDER — LIDOCAINE HCL (PF) 2 % IJ SOLN
INTRAMUSCULAR | Status: AC
  Filled 2022-03-19: qty 5

## 2022-03-19 MED ORDER — ACETAMINOPHEN 160 MG/5ML PO SOLN: 325.0000 mg | ORAL | Status: DC | PRN

## 2022-03-19 SURGICAL SUPPLY — 45 items
BLADE SURG 15 STRL LF DISP TIS (BLADE) ×2 IMPLANT
BLADE SURG 15 STRL SS (BLADE) ×2
BNDG CMPR 75X21 PLY HI ABS (MISCELLANEOUS) ×1
BNDG CMPR 9X4 STRL LF SNTH (GAUZE/BANDAGES/DRESSINGS) ×1
BNDG ELASTIC 3X5.8 VLCR STR LF (GAUZE/BANDAGES/DRESSINGS) ×1 IMPLANT
BNDG ELASTIC 6X5.8 VLCR STR LF (GAUZE/BANDAGES/DRESSINGS) ×1 IMPLANT
BNDG ESMARK 4X9 LF (GAUZE/BANDAGES/DRESSINGS) ×1 IMPLANT
BNDG GAUZE DERMACEA FLUFF 4 (GAUZE/BANDAGES/DRESSINGS) ×1 IMPLANT
BNDG GZE DERMACEA 4 6PLY (GAUZE/BANDAGES/DRESSINGS) ×1
COVER BACK TABLE 60X90IN (DRAPES) ×1 IMPLANT
CUFF TOURN SGL QUICK 18X4 (TOURNIQUET CUFF) IMPLANT
DRAPE EXTREMITY T 121X128X90 (DISPOSABLE) ×1 IMPLANT
DRAPE IMP U-DRAPE 54X76 (DRAPES) ×1 IMPLANT
DRAPE OEC MINIVIEW 54X84 (DRAPES) ×1 IMPLANT
DRSG EMULSION OIL 3X3 NADH (GAUZE/BANDAGES/DRESSINGS) ×1 IMPLANT
DURAPREP 26ML APPLICATOR (WOUND CARE) IMPLANT
GAUZE 4X4 16PLY ~~LOC~~+RFID DBL (SPONGE) ×1 IMPLANT
GAUZE SPONGE 4X4 12PLY STRL (GAUZE/BANDAGES/DRESSINGS) ×1 IMPLANT
GAUZE STRETCH 2X75IN STRL (MISCELLANEOUS) ×1 IMPLANT
GLOVE BIO SURGEON STRL SZ7.5 (GLOVE) ×2 IMPLANT
GLOVE BIOGEL PI IND STRL 7.5 (GLOVE) ×1 IMPLANT
GLOVE ECLIPSE 7.5 STRL STRAW (GLOVE) ×1 IMPLANT
GOWN STRL REUS W/TWL LRG LVL3 (GOWN DISPOSABLE) ×1 IMPLANT
MATRIX WOUND MESHED 2X2 (Tissue) IMPLANT
MICROMATRIX 1000MG (Tissue) ×1 IMPLANT
NDL HYPO 25X1 1.5 SAFETY (NEEDLE) ×2 IMPLANT
NDL SAFETY ECLIP 18X1.5 (MISCELLANEOUS) IMPLANT
NEEDLE HYPO 25X1 1.5 SAFETY (NEEDLE) ×2 IMPLANT
NS IRRIG 1000ML POUR BTL (IV SOLUTION) IMPLANT
PACK BASIN DAY SURGERY FS (CUSTOM PROCEDURE TRAY) ×1 IMPLANT
PADDING CAST ABS COTTON 4X4 ST (CAST SUPPLIES) ×1 IMPLANT
PENCIL SMOKE EVACUATOR (MISCELLANEOUS) ×1 IMPLANT
SOLUTION PARTIC MCRMTRX 1000MG (Tissue) IMPLANT
STOCKINETTE 6  STRL (DRAPES) ×1
STOCKINETTE 6 STRL (DRAPES) ×1 IMPLANT
SUT MNCRL AB 3-0 PS2 18 (SUTURE) ×1 IMPLANT
SUT MNCRL AB 4-0 PS2 18 (SUTURE) IMPLANT
SUT MON AB 5-0 PS2 18 (SUTURE) IMPLANT
SUT PROLENE 3 0 PS 2 (SUTURE) ×1 IMPLANT
SYR 10ML LL (SYRINGE) IMPLANT
SYR BULB EAR ULCER 3OZ GRN STR (SYRINGE) ×1 IMPLANT
SYR CONTROL 10ML LL (SYRINGE) ×2 IMPLANT
TUBE CONNECTING 12X1/4 (SUCTIONS) ×1 IMPLANT
UNDERPAD 30X36 HEAVY ABSORB (UNDERPADS AND DIAPERS) ×1 IMPLANT
WOUND MATRIX MESHED 2X2 (Tissue) ×1 IMPLANT

## 2022-03-19 NOTE — Brief Op Note (Signed)
03/19/2022  11:56 AM  PATIENT:  Elaine Buck  66 y.o. female  PRE-OPERATIVE DIAGNOSIS:  Wound dehiscence left  POST-OPERATIVE DIAGNOSIS:  Wound dehiscence left  PROCEDURE:  Procedure(s): DEBRIDEMENT WOUND/ULCER (Left) GRAFT APPLICATION (Left)  SURGEON:  Surgeon(s) and Role:    * Trula Slade, DPM - Primary  PHYSICIAN ASSISTANT:   ASSISTANTS: none   ANESTHESIA:   general  EBL:  minimal   BLOOD ADMINISTERED:none  DRAINS: none   LOCAL MEDICATIONS USED:  MARCAINE   , BUPIVICAINE , and Amount: 10 ml  SPECIMEN:  Source of Specimen:  wound culture  DISPOSITION OF SPECIMEN:  PATHOLOGY  COUNTS:  YES  TOURNIQUET:  * No tourniquets in log *  DICTATION: .Dragon Dictation  PLAN OF CARE: Discharge to home after PACU  PATIENT DISPOSITION:  PACU - hemodynamically stable.   Delay start of Pharmacological VTE agent (>24hrs) due to surgical blood loss or risk of bleeding: no  Intraoperative findings: Debrided wound to remove nonviable tissue. Wound did go to hardware. There was no purulence noted. Irrigated with 2L saline. I placed 2 3-0 prolene sutures on either end and a monocryl along the central to help bring edges together. Acell/Intega placed.   I have reviewed this case with our group at the inrtra-departmental case review conference.

## 2022-03-19 NOTE — Discharge Instructions (Addendum)
See written instructions Do not remove bandage         No ibuprofen, Advil, Aleve, Motrin, ketorolac, meloxicam, naproxen, or other NSAIDS until after 3:30 pm today if needed.  No acetaminophen/Tylenol until after 3:30 pm today if needed.     Post Anesthesia Home Care Instructions  Activity: Get plenty of rest for the remainder of the day. A responsible individual must stay with you for 24 hours following the procedure.  For the next 24 hours, DO NOT: -Drive a car -Paediatric nurse -Drink alcoholic beverages -Take any medication unless instructed by your physician -Make any legal decisions or sign important papers.  Meals: Start with liquid foods such as gelatin or soup. Progress to regular foods as tolerated. Avoid greasy, spicy, heavy foods. If nausea and/or vomiting occur, drink only clear liquids until the nausea and/or vomiting subsides. Call your physician if vomiting continues.  Special Instructions/Symptoms: Your throat may feel dry or sore from the anesthesia or the breathing tube placed in your throat during surgery. If this causes discomfort, gargle with warm salt water. The discomfort should disappear within 24 hours.

## 2022-03-19 NOTE — Interval H&P Note (Signed)
History and Physical Interval Note:  03/19/2022 11:06 AM  Elaine Buck  has presented today for surgery, with the diagnosis of Wound dehiscence left.  The various methods of treatment have been discussed with the patient and family. After consideration of risks, benefits and other options for treatment, the patient has consented to  Procedure(s): DEBRIDEMENT WOUND/ULCER (Left) GRAFT APPLICATION (Left) as a surgical intervention.  The patient's history has been reviewed, patient examined, no change in status, stable for surgery.  I have reviewed the patient's chart and labs.  Questions were answered to the patient's satisfaction.     Trula Slade

## 2022-03-19 NOTE — Anesthesia Procedure Notes (Signed)
Procedure Name: LMA Insertion Date/Time: 03/19/2022 11:21 AM  Performed by: Suan Halter, CRNAPre-anesthesia Checklist: Patient identified, Emergency Drugs available, Suction available and Patient being monitored Patient Re-evaluated:Patient Re-evaluated prior to induction Oxygen Delivery Method: Circle system utilized Preoxygenation: Pre-oxygenation with 100% oxygen Induction Type: IV induction Ventilation: Mask ventilation without difficulty LMA: LMA inserted LMA Size: 4.0 Number of attempts: 1 Airway Equipment and Method: Bite block Placement Confirmation: positive ETCO2 Tube secured with: Tape Dental Injury: Teeth and Oropharynx as per pre-operative assessment

## 2022-03-19 NOTE — Anesthesia Preprocedure Evaluation (Addendum)
Anesthesia Evaluation  Patient identified by MRN, date of birth, ID band Patient awake    Reviewed: Allergy & Precautions, H&P , NPO status , Patient's Chart, lab work & pertinent test results  Airway Mallampati: III  TM Distance: >3 FB Neck ROM: Full    Dental no notable dental hx. (+) Teeth Intact, Dental Advisory Given, Caps   Pulmonary neg pulmonary ROS, Current Smoker and Patient abstained from smoking.   Pulmonary exam normal breath sounds clear to auscultation       Cardiovascular Exercise Tolerance: Good negative cardio ROS Normal cardiovascular exam Rhythm:Regular Rate:Normal     Neuro/Psych  PSYCHIATRIC DISORDERS Anxiety Depression    Small fiber neuropathy, L foot , follows with Dr. Kandace Blitz @ [place].  Neuromuscular disease negative neurological ROS  negative psych ROS   GI/Hepatic negative GI ROS, Neg liver ROS,,,  Endo/Other  negative endocrine ROSHypothyroidism    Renal/GU negative Renal ROS  negative genitourinary   Musculoskeletal negative musculoskeletal ROS (+) Arthritis ,  Complex regional pain syndrome of left lower extremity   Abdominal   Peds negative pediatric ROS (+)  Hematology negative hematology ROS (+)   Anesthesia Other Findings   Reproductive/Obstetrics negative OB ROS                             Anesthesia Physical Anesthesia Plan  ASA: 3  Anesthesia Plan: General   Post-op Pain Management: Tylenol PO (pre-op)* and Celebrex PO (pre-op)*   Induction: Intravenous  PONV Risk Score and Plan: 3 and Ondansetron, Dexamethasone and Midazolam  Airway Management Planned: LMA and Oral ETT  Additional Equipment: None  Intra-op Plan:   Post-operative Plan: Extubation in OR  Informed Consent: I have reviewed the patients History and Physical, chart, labs and discussed the procedure including the risks, benefits and alternatives  for the proposed anesthesia with the patient or authorized representative who has indicated his/her understanding and acceptance.       Plan Discussed with: CRNA and Anesthesiologist  Anesthesia Plan Comments: ( )       Anesthesia Quick Evaluation

## 2022-03-19 NOTE — Transfer of Care (Signed)
Immediate Anesthesia Transfer of Care Note  Patient: Elaine Buck  Procedure(s) Performed: Procedure(s) (LRB): DEBRIDEMENT WOUND/ULCER (Left) GRAFT APPLICATION (Left)  Patient Location: PACU  Anesthesia Type: General  Level of Consciousness: awake, oriented, sedated and patient cooperative  Airway & Oxygen Therapy: Patient Spontanous Breathing and Patient connected to face mask oxygen  Post-op Assessment: Report given to PACU RN and Post -op Vital signs reviewed and stable  Post vital signs: Reviewed and stable  Complications: No apparent anesthesia complications Last Vitals:  Vitals Value Taken Time  BP    Temp    Pulse 80 03/19/22 1200  Resp    SpO2 98 % 03/19/22 1200  Vitals shown include unvalidated device data.  Last Pain:  Vitals:   03/19/22 0855  TempSrc: Oral         Complications: No notable events documented.

## 2022-03-20 ENCOUNTER — Encounter: Payer: Self-pay | Admitting: Podiatry

## 2022-03-20 ENCOUNTER — Encounter (HOSPITAL_BASED_OUTPATIENT_CLINIC_OR_DEPARTMENT_OTHER): Payer: Self-pay | Admitting: Podiatry

## 2022-03-20 NOTE — Anesthesia Postprocedure Evaluation (Signed)
Anesthesia Post Note  Patient: Elaine Buck  Procedure(s) Performed: DEBRIDEMENT WOUND/ULCER (Left) GRAFT APPLICATION (Left)     Patient location during evaluation: PACU Anesthesia Type: General Level of consciousness: awake and alert Pain management: pain level controlled Vital Signs Assessment: post-procedure vital signs reviewed and stable Respiratory status: spontaneous breathing, nonlabored ventilation, respiratory function stable and patient connected to nasal cannula oxygen Cardiovascular status: blood pressure returned to baseline and stable Postop Assessment: no apparent nausea or vomiting Anesthetic complications: no  No notable events documented.  Last Vitals:  Vitals:   03/19/22 1314 03/19/22 1318  BP: (!) 88/77 (!) 91/58  Pulse: 77   Resp:    Temp:  36.7 C  SpO2: 93%     Last Pain:  Vitals:   03/20/22 1200  TempSrc:   PainSc: 5                  Raphel Stickles

## 2022-03-24 ENCOUNTER — Ambulatory Visit (INDEPENDENT_AMBULATORY_CARE_PROVIDER_SITE_OTHER): Payer: Medicare Other | Admitting: Podiatry

## 2022-03-24 VITALS — BP 121/68 | HR 85 | Temp 97.7°F

## 2022-03-24 DIAGNOSIS — T8130XA Disruption of wound, unspecified, initial encounter: Secondary | ICD-10-CM

## 2022-03-24 LAB — AEROBIC/ANAEROBIC CULTURE W GRAM STAIN (SURGICAL/DEEP WOUND)
Culture: NO GROWTH
Gram Stain: NONE SEEN

## 2022-03-24 NOTE — Progress Notes (Signed)
Subjective: Chief Complaint  Patient presents with   Routine Post Op    POV #1 DOS 03/19/2022 LT FOOT DEBRIDMENT OF UCLER, SYNTHETIC SKIN SUBSTITUTE APPLICATION   "Its been okay, its had some pain"    67 year old female presents the above concerns.  She is in some discomfort she still taking pain medication for.  She is finishing a course of antibiotics.  She has not had any fevers or chills and does not report any other signs of infection.    Objective: AAO x3, NAD DP/PT pulses palpable bilaterally, CRT less than 3 seconds Left foot: Status post graft application Integra graft is incorporating.  There is no drainage or pus noted.  Trace edema.  There is no significant erythema there is no warmth.  No fluctuance or crepitation but there is no malodor.  No ascending cellulitis. No pain with calf compression, swelling, warmth, erythema  Assessment: Status post graft application for wound dehiscence  Plan: -All treatment options discussed with the patient including all alternatives, risks, complications.  -Graft appears to be incorporating.  I did change the dressing and Adaptic was applied followed by dry sterile dressing.  She schedule follow-up next week and I gave her dressing supplies to change the bandage later this week.  Does not appear to be any signs of infection so benefits the course of current antibiotics and would hold off on further antibiotics but should she notice any changes to let me know.  Continue pain medication as needed.  Nonweightbearing.  Elevation. -Patient encouraged to call the office with any questions, concerns, change in symptoms.   Vivi Barrack DPM

## 2022-03-29 NOTE — Op Note (Signed)
PATIENT:  Elaine Buck  67 y.o. female   PRE-OPERATIVE DIAGNOSIS:  Wound dehiscence left   POST-OPERATIVE DIAGNOSIS:  Wound dehiscence left   PROCEDURE:  Procedure(s): DEBRIDEMENT WOUND/ULCER (Left) GRAFT APPLICATION (Left)   SURGEON:  Surgeon(s) and Role:    * Trula Slade, DPM - Primary   PHYSICIAN ASSISTANT:    ASSISTANTS: none    ANESTHESIA:   general   EBL:  minimal    BLOOD ADMINISTERED:none   DRAINS: none    LOCAL MEDICATIONS USED:  MARCAINE   , BUPIVICAINE , and Amount: 10 ml   SPECIMEN:  Source of Specimen:  wound culture   DISPOSITION OF SPECIMEN:  PATHOLOGY   COUNTS:  YES   TOURNIQUET:  * No tourniquets in log *   DICTATION: .Dragon Dictation   PLAN OF CARE: Discharge to home after PACU   PATIENT DISPOSITION:  PACU - hemodynamically stable.   Delay start of Pharmacological VTE agent (>24hrs) due to surgical blood loss or risk of bleeding: no   Intraoperative findings: Debrided wound to remove nonviable tissue. Wound did go to hardware. There was no purulence noted. Irrigated with 2L saline. I placed 2 3-0 prolene sutures on either end and a monocryl along the central to help bring edges together. Acell/Intega placed.    I have reviewed this case with our group at the inrtra-departmental case review conference.   Indications for surgery: 67 year old female recent underwent first MPJ arthrodesis with bone graft.  This Came off the distal portion incision dehisced and noticed hardware.  Blood work was within normal limits but clinically no signs of infection so discussed with her debridement, graft application.  We discussed the procedure as well as postoperative course.  Alternatives risks and complications were discussed.  No promises or guarantees given second the procedure all questions answered best my ability.  Procedure in detail: The patient's with verbally and visually identified by myself and nursing staff and the anesthesia staff  preoperatively.  She was then transferred to the operating room via stretcher and placed on the operative table in supine position.  After an adequate plane of anesthesia was obtained the left lower extremities and scrubbed, prepped, draped in normal sterile fashion.  Timeout was performed.  Attention was directed to the distal portion of the incision where there was a wound dehiscence.  Prior to debridement of the wound it measured 1 x 0.6 cm and was down to the level of the hardware.  I utilized a 15 blade scalpel to circumferentially excise, debride the wound down to healthy, bleeding edges.  After debridement the wound measured 1.2 x 0.9 cm and was full-thickness.  There is no probing, undermining or tunneling.  There is no purulence noted.  Hardware appear to be stable.  I took a wound culture at this time.  I then utilized 2 L of saline to irrigate the wound with a pulse lavage.  At this time the wound appears to be healthy.  I did place two 3-0 Prolene sutures to help bring the edges together somewhat on the proximal distal aspects.  I then utilized a ACell matrix and I applied this to the wound.  I utilized 50 mL of this and the rest was wasted.  At this time I utilized a 5 x 5 Integra graft.  Places on the wound it was secured in place with Monocryl suture.  He was fitted to the ulcer.  I utilized about 2 x 2 of this graft and the rest was  wasted.  At this time an x-ray of lidocaine, Marcaine plain was infiltrated in a regional block fashion.  Then applied Adaptic followed by dry sterile dressing.  She was woken from anesthesia and found to tolerate the procedure well any complications.  Transferred to PACU vital signs stable and vascular status intact.

## 2022-03-30 ENCOUNTER — Encounter: Payer: Medicare Other | Admitting: Family Medicine

## 2022-04-02 ENCOUNTER — Ambulatory Visit (INDEPENDENT_AMBULATORY_CARE_PROVIDER_SITE_OTHER): Payer: Medicare Other | Admitting: Podiatry

## 2022-04-02 VITALS — BP 126/74 | HR 70

## 2022-04-02 DIAGNOSIS — T8130XA Disruption of wound, unspecified, initial encounter: Secondary | ICD-10-CM

## 2022-04-02 DIAGNOSIS — M2022 Hallux rigidus, left foot: Secondary | ICD-10-CM

## 2022-04-02 NOTE — Progress Notes (Signed)
Ppatient presents today for post op visit # 2, patient of Dr. Jacqualyn Posey.   POV # 2 DOS 03/19/2022 LT FOOT DEBRIDMENT OF UCLER, SYNTHETIC SKIN SUBSTITUTE APPLICATION    Patient presents in their walking boot non-weightbearing. Denies any falls or injury to the foot. Three is mild swelling to the toe but overall improved. No signs of infection.  There is no erythema or warmth.  No calf pain or shortness of breath. Bandages dry and intact. Incision is intact. Dr. Jacqualyn Posey did trim the left hallux today.    BP: 126/74 P: 70     Dr. Jacqualyn Posey did take a look at her foot today as well.   Foot redressed today with adaptic followed by dry sterile dressing and placed back in the boot. Reviewed icing and elevation. Patient will follow up with Dr. Jacqualyn Posey next week for POV# 3 with suture removal. - Overall the patient is doing well.  There appears to be Integra graft is still incorporating and he can appreciate increased granulation tissue present.  There is no surrounding erythema, ascending cellulitis.  No fluctuance or crepitation but there is no malodor.  Mild tenderness palpation at surgical site.  Hope to remove the sutures, silicone layer next week.  She is in continue nonweightbearing.  It does appear that the cam boot strap is rubbing the incision in that area that could have been contributing to why the wound opened up.  She will try the tall cam boot to see if this takes more pressure off her breast otherwise remain nonweightbearing for now.  [name]

## 2022-04-09 ENCOUNTER — Ambulatory Visit (INDEPENDENT_AMBULATORY_CARE_PROVIDER_SITE_OTHER): Payer: Medicare Other | Admitting: Podiatry

## 2022-04-09 DIAGNOSIS — M2022 Hallux rigidus, left foot: Secondary | ICD-10-CM

## 2022-04-09 DIAGNOSIS — T8130XA Disruption of wound, unspecified, initial encounter: Secondary | ICD-10-CM

## 2022-04-12 NOTE — Progress Notes (Signed)
Subjective: Chief Complaint  Patient presents with   Follow-up    POV #3 DOS 03/19/2022 LT FOOT DEBRIDMENT OF UCLER, SYNTHETIC SKIN SUBSTITUTE APPLICATION     67 year old female presents the above concerns.  She states that she is not having significant pain and no significant nerve pains either.  She presents today for follow-up evaluation.  She states that she has tried different bracing surgical shoes and is seems to be rubbing on the area of the wound.  She denies any fevers or chills.   Objective: AAO x3, NAD DP/PT pulses palpable bilaterally, CRT less than 3 seconds Left foot: Status post graft application Integra graft is incorporating.  I removed the silicone layer.  There is no exposed hardware noted.  There is no significant surrounding erythema, ascending cellulitis there is no purulence.  No clinical signs of infection present.   No pain with calf compression, swelling, warmth, erythema    Assessment: Status post graft application for wound dehiscence  Plan: -All treatment options discussed with the patient including all alternatives, risks, complications.  -Graft appears to be incorporating.  I remove the silicone layer today.  No exposed hardware.  Adaptic was applied followed by dressing.  Discussed dressing changes every couple of days.  Continue with offloading, cam boot.  Unfortunate dressing in the boots was causing the irritation in the wound.  Discussed that if she can find a stiffer open toed sandal to try to wear that type of shoe to avoid any pressure. -Monitor for any clinical signs or symptoms of infection and directed to call the office immediately should any occur or go to the ER.  Return in about 1 week (around 04/16/2022).  X-ray next appointment  [name]

## 2022-04-16 ENCOUNTER — Ambulatory Visit (INDEPENDENT_AMBULATORY_CARE_PROVIDER_SITE_OTHER): Payer: Medicare Other | Admitting: Podiatry

## 2022-04-16 ENCOUNTER — Ambulatory Visit (INDEPENDENT_AMBULATORY_CARE_PROVIDER_SITE_OTHER): Payer: Medicare Other

## 2022-04-16 ENCOUNTER — Other Ambulatory Visit: Payer: Self-pay | Admitting: Podiatry

## 2022-04-16 DIAGNOSIS — M2022 Hallux rigidus, left foot: Secondary | ICD-10-CM

## 2022-04-16 DIAGNOSIS — Z9889 Other specified postprocedural states: Secondary | ICD-10-CM

## 2022-04-19 NOTE — Progress Notes (Signed)
Subjective: Chief Complaint  Patient presents with   Post-op Follow-up    POV #4 DOS 03/19/2022 LT FOOT DEBRIDMENT OF UCLER, SYNTHETIC SKIN SUBSTITUTE APPLICATION     67 year old female presents the above concerns.  She states that the wound is looking good.  Continue with dressing changes with Adaptic and dry dressing.  She has not seen any increase in swelling or redness or any drainage.  She denies any fevers or chills.  She is to wear the cam boot.  She is not able to find another shoe to wear.  She is asking if she is back to close and sneaker.     Objective: AAO x3, NAD DP/PT pulses palpable bilaterally, CRT less than 3 seconds Left foot: Status post graft application Integra graft wound appears to be filling in.  There is 1 central area which are still able to probe the hardware but the remainder of the sinus filled in nicely.  There is no surrounding erythema, ascending cellulitis there is no fluctuance or crepitation there is no malodor.  No pain with calf compression, swelling, warmth, erythema   Assessment: Status post graft application for wound dehiscence  Plan: -All treatment options discussed with the patient including all alternatives, risks, complications.  -X-rays were obtained reviewed.  3 views of the foot were obtained.  Hardware intact for any complication this time.  He does have Increased consolidation across the arthrodesis site. -Wound has been refilling then and doing better.  Removed the 2 sutures or any complications.  There is still 1 area that you are able to probe the hardware, this area is smaller than what it was prior to surgery continue to heal.  continue dressing changes with adaptic and dry sterile dressing.  i would not recommend wearing closed in shoe at this time. -Continue cam boot.  Elevation. -Monitor for any clinical signs or symptoms of infection and directed to call the office immediately should any occur or go to the ER.  Return in about 10  days (around 04/26/2022).  [name]

## 2022-04-27 ENCOUNTER — Encounter: Payer: Self-pay | Admitting: Podiatry

## 2022-04-27 ENCOUNTER — Ambulatory Visit (INDEPENDENT_AMBULATORY_CARE_PROVIDER_SITE_OTHER): Payer: Medicare Other | Admitting: Podiatry

## 2022-04-27 VITALS — BP 120/70 | HR 95

## 2022-04-27 DIAGNOSIS — T8130XA Disruption of wound, unspecified, initial encounter: Secondary | ICD-10-CM

## 2022-04-27 DIAGNOSIS — M2022 Hallux rigidus, left foot: Secondary | ICD-10-CM

## 2022-04-29 ENCOUNTER — Other Ambulatory Visit: Payer: Self-pay | Admitting: Family Medicine

## 2022-04-29 NOTE — Progress Notes (Signed)
Subjective: Chief Complaint  Patient presents with   Routine Post Op    10 days for wound dehiscence POV #5 DOS 03/19/2022 LT FOOT DEBRIDMENT OF UCLER, SYNTHETIC SKIN SUBSTITUTE APPLICATIO    67 year old female presents the above concerns.  She presents today for wound check.  She has been using Adaptic over the wound followed by dressing.  She still in the cam boot.  She has not seen any increasing drainage or pus.  She shows me the bandage and a small amount of bloody drainage on the bandage but no pus.  She has noted swelling.  No pain.    Objective: AAO x3, NAD DP/PT pulses palpable bilaterally, CRT less than 3 seconds Left foot: Status post graft application Integra graft wound appears to be filling in.  Central aspect still able to probe to the hardware but around the periphery this is filling in.  There is no surrounding erythema, ascending cellulitis.  There is no fluctuance or crepitation is noted.  No obvious signs of infection.  There is pitting edema present bilaterally.  She does not have any erythema warmth or any open lesions.  No chest pain or shortness of breath.  No pain with calf compression, swelling, warmth, erythema   Assessment: Status post graft application for wound dehiscence  Plan: -All treatment options discussed with the patient including all alternatives, risks, complications.  -I did clean the wound with saline today.  I applied donated collagen, blast X to the wound and gave her this to go home with as well to apply daily followed by dressing. -She will hold off on Celebrex to see if this is accounting for bilateral swelling.  If no improvement with this will need to follow with her primary care doctor. -Continue CAM Boot for now -Monitor for any clinical signs or symptoms of infection and directed to call the office immediately should any occur or go to the ER.  [name]

## 2022-05-05 ENCOUNTER — Ambulatory Visit (INDEPENDENT_AMBULATORY_CARE_PROVIDER_SITE_OTHER): Payer: Medicare Other | Admitting: Podiatry

## 2022-05-05 DIAGNOSIS — T8130XA Disruption of wound, unspecified, initial encounter: Secondary | ICD-10-CM

## 2022-05-05 DIAGNOSIS — M2022 Hallux rigidus, left foot: Secondary | ICD-10-CM

## 2022-05-09 NOTE — Progress Notes (Unsigned)
Subjective: Chief Complaint  Patient presents with   Follow-up    DOS 03/19/2022 LT FOOT DEBRIDMENT OF UCLER, SYNTHETIC SKIN SUBSTITUTE APPLICATION, NO CONCERNS, OR PAIN TODAY    67 year old female presents the above concerns.  She presents today for wound check.  She was using the blast X, collagen that seem to make it go wound larger.  She does not see any drainage or pus.  No increased swelling or redness.  No fevers or chills.  No significant pain.  No other concerns today.    Objective: AAO x3, NAD DP/PT pulses palpable bilaterally, CRT less than 3 seconds Left foot: The wound appears to be about the same. Pulmonary work able to visualize hardware.  There is no surrounding erythema, ascending cellulitis there is no drainage or pus.  No cultures were crepitation there is no malodor.  Remainder of incision appears to be well-healed with eschar. There is pitting edema present bilaterally.  She does not have any erythema warmth or any open lesions.  No chest pain or shortness of breath.  No pain with calf compression, swelling, warmth, erythema   Assessment: Status post graft application for wound dehiscence  Plan: -All treatment options discussed with the patient including all alternatives, risks, complications.  -I did clean the wound with saline today.  Unfortunately collagen, blast X is not helping.  Today he has switched to using a donated piece of PuraPly antimicrobial wound matrix (Lot [id number] exp 01/08/2023) followed by Mepitel, Hydrofera Blue dressing. -Elevation -Continue CAM Boot for now -Monitor for any clinical signs or symptoms of infection and directed to call the office immediately should any occur or go to the ER.  [name]

## 2022-05-12 ENCOUNTER — Ambulatory Visit (INDEPENDENT_AMBULATORY_CARE_PROVIDER_SITE_OTHER): Payer: Medicare Other | Admitting: Podiatry

## 2022-05-12 ENCOUNTER — Ambulatory Visit (INDEPENDENT_AMBULATORY_CARE_PROVIDER_SITE_OTHER): Payer: Medicare Other

## 2022-05-12 DIAGNOSIS — M2022 Hallux rigidus, left foot: Secondary | ICD-10-CM

## 2022-05-12 DIAGNOSIS — T8130XA Disruption of wound, unspecified, initial encounter: Secondary | ICD-10-CM

## 2022-05-12 MED ORDER — CEPHALEXIN 500 MG PO CAPS: 500.0000 mg | ORAL_CAPSULE | Freq: Three times a day (TID) | ORAL | 0 refills | Status: DC

## 2022-05-12 NOTE — Progress Notes (Signed)
Subjective: Chief Complaint  Patient presents with   Routine Post Op    Rm 14  DOS 03/19/2022 LT FOOT DEBRIDMENT OF UCLER, SYNTHETIC SKIN SUBSTITUTE APPLICATION    67 year old female presents the above concerns.  She presents today for wound check.  She does not report any increase swelling or redness or any pain.  She denies any fevers or chills.  No other concerns.    Objective: AAO x3, NAD DP/PT pulses palpable bilaterally, CRT less than 3 seconds Left foot: The wound appears to be about the same.  There is still 1 central area after cleaning the wound that probes to hardware but there is some increased granulation tissue on the periphery of the wound today.  No drainage today.  Faint rim of erythema without any ascending cellulitis.  No fluctuance or crepitation.  No malodor.  Arthrodesis site appears to be stable No pain with calf compression, swelling, warmth, erythema   Assessment: Status post graft application for wound dehiscence  Plan: -All treatment options discussed with the patient including all alternatives, risks, complications.  -X-rays were obtained reviewed.  3 views of the foot obtained.  There does appear to be increased consolidation noted across the graft site.  No evidence of hardware failure at this time. -We discussed both conservative as well as surgical options.  We discussed removal of hardware once the bone is healed and I think this will also help with the healing of the wound. -I did clean the wound with saline today. Today a donated piece of PuraPly antimicrobial wound matrix (Lot [id number] exp 01/08/2023) followed by Mepitel, Hydrofera Blue dressing. -Elevation -Continue CAM Boot for now -Monitor for any clinical signs or symptoms of infection and directed to call the office immediately should any occur or go to the ER.  [name]

## 2022-05-13 ENCOUNTER — Other Ambulatory Visit: Payer: Self-pay | Admitting: Podiatry

## 2022-05-13 DIAGNOSIS — T8130XA Disruption of wound, unspecified, initial encounter: Secondary | ICD-10-CM

## 2022-05-14 ENCOUNTER — Other Ambulatory Visit: Payer: Self-pay | Admitting: Podiatry

## 2022-05-18 ENCOUNTER — Ambulatory Visit (INDEPENDENT_AMBULATORY_CARE_PROVIDER_SITE_OTHER): Payer: Medicare Other | Admitting: Podiatry

## 2022-05-18 DIAGNOSIS — M2022 Hallux rigidus, left foot: Secondary | ICD-10-CM

## 2022-05-18 DIAGNOSIS — T8130XA Disruption of wound, unspecified, initial encounter: Secondary | ICD-10-CM

## 2022-05-20 ENCOUNTER — Encounter: Payer: Self-pay | Admitting: Podiatry

## 2022-05-20 ENCOUNTER — Ambulatory Visit (INDEPENDENT_AMBULATORY_CARE_PROVIDER_SITE_OTHER): Payer: Medicare Other

## 2022-05-20 DIAGNOSIS — M2022 Hallux rigidus, left foot: Secondary | ICD-10-CM | POA: Diagnosis not present

## 2022-05-20 DIAGNOSIS — M7732 Calcaneal spur, left foot: Secondary | ICD-10-CM | POA: Diagnosis not present

## 2022-05-21 NOTE — Progress Notes (Signed)
Subjective: Chief Complaint  Patient presents with   Post-op Problem    DOS 03/19/2022 LT FOOT DEBRIDMENT OF UCLER, SYNTHETIC SKIN SUBSTITUTE APPLICATION    67 year old female presents the above concerns.  States that she is been doing well.  She did notice some bleeding yesterday.  She denies any fevers or chills.  She wants to aspect the CT scan that we discussed for possible plate removal.    Objective: AAO x3, NAD DP/PT pulses palpable bilaterally, CRT less than 3 seconds Left foot: Wound still present along the distal aspect incision with central area proximal to the hardware.  There is no surrounding erythema, ascending cellulitis.  No fluctuance or crepitation there is no malodor.  No obvious signs of infection but the toe is rectus. No pain with calf compression, swelling, warmth, erythema   Assessment: Status post graft application for wound dehiscence without signs of infection  Plan: -All treatment options discussed with the patient including all alternatives, risks, complications.  -We discussed the conservative as well as surgical treatment options.  Will order CT scan to evaluate the healing of the graft.  If it appears to be healed then consider hardware removal. -I did clean the wound with saline today. Today a donated piece of PuraPly antimicrobial wound matrix (Lot [id number] exp 01/08/2023) followed by Mepitel, Hydrofera Blue dressing. -Elevation -Continue CAM Boot for now -Monitor for any clinical signs or symptoms of infection and directed to call the office immediately should any occur or go to the ER.  [name]

## 2022-05-25 ENCOUNTER — Ambulatory Visit (INDEPENDENT_AMBULATORY_CARE_PROVIDER_SITE_OTHER): Payer: Medicare Other | Admitting: Podiatry

## 2022-05-25 DIAGNOSIS — M2022 Hallux rigidus, left foot: Secondary | ICD-10-CM

## 2022-05-25 DIAGNOSIS — T8130XA Disruption of wound, unspecified, initial encounter: Secondary | ICD-10-CM

## 2022-06-01 ENCOUNTER — Ambulatory Visit (INDEPENDENT_AMBULATORY_CARE_PROVIDER_SITE_OTHER): Payer: Medicare Other | Admitting: Podiatry

## 2022-06-01 DIAGNOSIS — T8130XA Disruption of wound, unspecified, initial encounter: Secondary | ICD-10-CM

## 2022-06-01 DIAGNOSIS — M2022 Hallux rigidus, left foot: Secondary | ICD-10-CM

## 2022-06-01 NOTE — Progress Notes (Signed)
Subjective: Chief Complaint  Patient presents with   Follow-up    POST OP DOS 03/19/2022 LT FOOT DEBRIDMENT OF UCLER, SYNTHETIC SKIN SUBSTITUTE APPLICATION, PATIENT STATED FOOT HAS NO CHANGES SINCE LAST VISIT,NO PAIN, NO DRAINAGE      67 year old female presents the above concerns.  Stated about the same.  Denies any fevers or chills.  No new concerns today.   Objective: AAO x3, NAD DP/PT pulses palpable bilaterally, CRT less than 3 seconds Left foot: Change.  Still wound of the distal portion incision and able to probe the hardware.  There does appear to be some increased granulation tissue on the periphery of the wound.  There is no sign of erythema, ascending cellulitis.  There is no fluctuance or crepitation.  There is no malodor.  No obvious signs of infection.  Remainder of incisions well-healed.  Overall mostly unchanged. No pain with calf compression, swelling, warmth, erythema   Assessment: Status post graft application for wound dehiscence without signs of infection  Plan: -All treatment options discussed with the patient including all alternatives, risks, complications.  -Awaiting over read for CT scan. -I did clean the wound with saline today. Today a donated piece of PuraPly antimicrobial wound matrix (Lot [id number] exp 06/11/2023) followed by Mepitel, Hydrofera Blue dressing. -Elevation -Continue CAM Boot for now -Monitor for any clinical signs or symptoms of infection and directed to call the office immediately should any occur or go to the ER.  [name]

## 2022-06-01 NOTE — Progress Notes (Signed)
Subjective: Chief Complaint  Patient presents with   Post-op Follow-up    POST OP DOS 03/19/2022 LT FOOT DEBRIDMENT OF UCLER, SYNTHETIC SKIN SUBSTITUTE APPLICATION    67 year old female presents the above concerns.  Stated about the same.  Denies any fevers or chills.  She did get the CT scan done.  She has no other concerns today.   Objective: AAO x3, NAD DP/PT pulses palpable bilaterally, CRT less than 3 seconds Left foot: Change.  Still wound of the distal portion incision and able to probe the hardware.  There is no sign of erythema, ascending cellulitis.  There is no fluctuance or crepitation.  There is no malodor.  No obvious signs of infection.  Remainder of incisions well-healed. No pain with calf compression, swelling, warmth, erythema   Assessment: Status post graft application for wound dehiscence without signs of infection  Plan: -All treatment options discussed with the patient including all alternatives, risks, complications.  -We discussed the conservative as well as surgical treatment options.  CT was completed that shows 50 to 75% healing.  In the past send the CT for second opinion.  I think ultimately the hardware needs to be removed back like to try to make sure it is healed prior to this if able there is clinically no signs of infection. -I did clean the wound with saline today. Today a donated piece of PuraPly antimicrobial wound matrix (Lot [id number] exp 06/11/2023) followed by Mepitel, Hydrofera Blue dressing. -Elevation -Continue CAM Boot for now -Monitor for any clinical signs or symptoms of infection and directed to call the office immediately should any occur or go to the ER.  [name]

## 2022-06-09 ENCOUNTER — Ambulatory Visit (INDEPENDENT_AMBULATORY_CARE_PROVIDER_SITE_OTHER): Payer: Medicare Other | Admitting: Podiatry

## 2022-06-09 DIAGNOSIS — M2022 Hallux rigidus, left foot: Secondary | ICD-10-CM

## 2022-06-09 MED ORDER — TOPIRAMATE 25 MG PO TABS: 50.0000 mg | ORAL_TABLET | Freq: Every day | ORAL | 6 refills | Status: DC

## 2022-06-09 NOTE — Progress Notes (Signed)
Subjective:  Chief Complaint  Patient presents with   Post-op Follow-up    POST OP DOS 03/19/2022 LT FOOT DEBRIDMENT OF UCLER, SYNTHETIC SKIN SUBSTITUTE APPLICATION, PATIENT STATED THE EFT FOOT FEELS THE SAME, NO CHANGES    67 year old female presents the above concerns.  She has no new concerns and states is been doing well otherwise.  She presents to discuss a second opinion on the MRI read.  She been wearing the cam boot but she wears a slipper on the hallux and she states the toe feels great not having any pain.  She does not report any fevers or chills.   Objective: AAO x3, NAD DP/PT pulses palpable bilaterally, CRT less than 3 seconds Left foot: Change.  Still wound of the distal portion incision and able to probe the hardware.  This appears about the same.  There is no surrounding erythema, ascending cellulitis.  There is no fluctuance or crepitation but there is no malodor.  No obvious signs of infection are noted.  On the case that is stable.  There does appear to be some increased granulation tissue on the periphery of the wound.  There is no sign of erythema, ascending cellulitis.  There is no fluctuance or crepitation.  There is no malodor.  No obvious signs of infection.  Remainder of incisions well-healed.  Overall mostly unchanged. No pain with calf compression, swelling, warmth, erythema   Assessment: Status post graft application for wound dehiscence without signs of infection  Plan: -All treatment options discussed with the patient including all alternatives, risks, complications.  -We discussed that over read from that CT scan that we discussed the conservative as well as surgical treatment seems.  This point the plate is still exposed and appears to have enough healing to go and remove the hardware and likely place smaller hardware away from where the wound was at if needed.  We discussed risks of surgery and she wishes to proceed soon as possible for this. -The incision  placement as well as the postoperative course was discussed with the patient. I discussed risks of the surgery which include, but not limited to, infection, bleeding, pain, swelling, need for further surgery, delayed or nonhealing, painful or ugly scar, numbness or sensation changes, over/under correction, recurrence, transfer lesions, further deformity, hardware failure, DVT/PE, loss of toe/foot. Patient understands these risks and wishes to proceed with surgery. The surgical consent was reviewed with the patient all 3 pages were signed. No promises or guarantees were given to the outcome of the procedure. All questions were answered to the best of my ability. Before the surgery the patient was encouraged to call the office if there is any further questions. The surgery will be performed at the [facility] on an outpatient basis. -I did clean the wound with saline today. Today a donated piece of PuraPly antimicrobial wound matrix (Lot [id number] exp 06/11/2023) followed by Mepitel, Hydrofera Blue dressing. -Elevation -Continue CAM Boot for now -Monitor for any clinical signs or symptoms of infection and directed to call the office immediately should any occur or go to the ER.  [name]

## 2022-06-09 NOTE — Patient Instructions (Signed)
Pre-Operative Instructions  Congratulations, you have decided to take an important step to improving your quality of life.  You can be assured that the doctors of [facility] will be with you every step of the way.  Plan to be at the surgery center/hospital at least 1 (one) hour prior to your scheduled time unless otherwise directed by the surgical center/hospital staff.  You must have a responsible adult accompany you, remain during the surgery and drive you home.  Make sure you have directions to the surgical center/hospital and know how to get there on time. For hospital based surgery you will need to obtain a history and physical form from your family physician within 1 month prior to the date of surgery- we will give you a form for you primary physician.  We make every effort to accommodate the date you request for surgery.  There are however, times where surgery dates or times have to be moved.  We will contact you as soon as possible if a change in schedule is required.   No Aspirin/Ibuprofen for one week before surgery.  If you are on aspirin, any non-steroidal anti-inflammatory medications (Mobic, Aleve, Ibuprofen) you should stop taking it 7 days prior to your surgery.  You make take Tylenol  For pain prior to surgery.  Medications- If you are taking daily heart and blood pressure medications, seizure, reflux, allergy, asthma, anxiety, pain or diabetes medications, make sure the surgery center/hospital is aware before the day of surgery so they may notify you which medications to take or avoid the day of surgery. No food or drink after midnight the night before surgery unless directed otherwise by surgical center/hospital staff. No alcoholic beverages 24 hours prior to surgery.  No smoking 24 hours prior to or 24 hours after surgery. Wear loose pants or shorts- loose enough to fit over bandages, boots, and casts. No slip on shoes, sneakers are best. Bring your boot with you to the  surgery center/hospital.  Also bring crutches or a walker if your physician has prescribed it for you.  If you do not have this equipment, it will be provided for you after surgery. If you have not been contracted by the surgery center/hospital by the day before your surgery, call to confirm the date and time of your surgery. Leave-time from work may vary depending on the type of surgery you have.  Appropriate arrangements should be made prior to surgery with your employer. Prescriptions will be provided immediately following surgery by your doctor.  Have these filled as soon as possible after surgery and take the medication as directed. Remove nail polish on the operative foot. Wash the night before surgery.  The night before surgery wash the foot and leg well with the antibacterial soap provided and water paying special attention to beneath the toenails and in between the toes.  Rinse thoroughly with water and dry well with a towel.  Perform this wash unless told not to do so by your physician.  Enclosed: 1 Ice pack (please put in freezer the night before surgery)   1 Hibiclens skin cleaner   Pre-op Instructions  If you have any questions regarding the instructions, do not hesitate to call our office at any point during this process.   [place]: [address] [place], [place] [address] [phone]  [place]: [address] [place], [place] [address] [phone]  Dr. Ramya Vanbergen, DPM  

## 2022-06-10 ENCOUNTER — Telehealth: Payer: Self-pay | Admitting: Urology

## 2022-06-10 NOTE — Telephone Encounter (Signed)
DOS - 06/17/22  REMOVAL HARDWARE RIGHT --- [address] MPJ FUSION RIGHT --- [place] [id number] Notification or Prior Authorization is not required for the requested services You are not required to submit a notification/prior authorization based on the information provided. The number above acknowledges your inquiry and our response.   Decision ID #: [id number]

## 2022-06-15 ENCOUNTER — Encounter: Payer: Self-pay | Admitting: Podiatry

## 2022-06-16 ENCOUNTER — Other Ambulatory Visit: Payer: Self-pay | Admitting: Podiatry

## 2022-06-16 MED ORDER — CEPHALEXIN 500 MG PO CAPS: 500.0000 mg | ORAL_CAPSULE | Freq: Three times a day (TID) | ORAL | 0 refills | Status: DC

## 2022-06-16 MED ORDER — PROMETHAZINE HCL 25 MG PO TABS: 25.0000 mg | ORAL_TABLET | Freq: Three times a day (TID) | ORAL | 0 refills | Status: DC | PRN

## 2022-06-16 MED ORDER — HYDROMORPHONE HCL 2 MG PO TABS: 2.0000 mg | ORAL_TABLET | ORAL | 0 refills | Status: DC | PRN

## 2022-06-17 ENCOUNTER — Other Ambulatory Visit: Payer: Self-pay | Admitting: Podiatry

## 2022-06-17 DIAGNOSIS — Z472 Encounter for removal of internal fixation device: Secondary | ICD-10-CM | POA: Diagnosis not present

## 2022-06-17 DIAGNOSIS — Z981 Arthrodesis status: Secondary | ICD-10-CM | POA: Diagnosis not present

## 2022-06-17 DIAGNOSIS — Z4889 Encounter for other specified surgical aftercare: Secondary | ICD-10-CM | POA: Diagnosis not present

## 2022-06-17 DIAGNOSIS — B9689 Other specified bacterial agents as the cause of diseases classified elsewhere: Secondary | ICD-10-CM | POA: Diagnosis not present

## 2022-06-17 MED ORDER — HYDROMORPHONE HCL 2 MG PO TABS: 2.0000 mg | ORAL_TABLET | ORAL | 0 refills | Status: DC | PRN

## 2022-06-22 ENCOUNTER — Ambulatory Visit (INDEPENDENT_AMBULATORY_CARE_PROVIDER_SITE_OTHER): Payer: Medicare Other | Admitting: Podiatry

## 2022-06-22 ENCOUNTER — Ambulatory Visit (INDEPENDENT_AMBULATORY_CARE_PROVIDER_SITE_OTHER): Payer: Medicare Other

## 2022-06-22 DIAGNOSIS — Z9889 Other specified postprocedural states: Secondary | ICD-10-CM

## 2022-06-22 DIAGNOSIS — T8130XA Disruption of wound, unspecified, initial encounter: Secondary | ICD-10-CM

## 2022-06-22 DIAGNOSIS — M2022 Hallux rigidus, left foot: Secondary | ICD-10-CM

## 2022-06-23 NOTE — Progress Notes (Signed)
Subjective: Chief Complaint  Patient presents with   Post-op Follow-up    POV # 1 DOS 06/17/22 --- LT FOOT REMOVAL OF HARDWARE, POSSIBLE REVISION OF 1ST MPJ FUSION, NO PAIN, NO COMPLAINTS    68 year old female presents the office today with above concerns.  She states that she is doing well and should not make any pain since surgery last week.  She does not report any fevers or chills.  Objective: AAO x3, NAD DP/PT pulses palpable bilaterally, CRT less than 3 seconds Incision to the left foot is well coapted with sutures intact.  There is no significant edema is no erythema, drainage or pus or signs of infection.  There is no ascending cellulitis.  There is no malodor.  Incision well coapted. No pain with calf compression, swelling, warmth, erythema  Assessment: Status post hard removal left foot  Plan: -All treatment options discussed with the patient including all alternatives, risks, complications.  -X-rays obtained and reviewed. Status post removal of hardware from left foot.  No evidence of acute fracture noted.  Graft intact. -She has been nonweightbearing but discussed that she starts to improve this week she can start to transition to partial weightbearing in cam boot for limited distances.  Continue to ice and elevate.  Discussed with his any increased pain, swelling to remain in the cam boot. -Pain medication as needed -Ice and elevation as well as compression -Patient encouraged to call the office with any questions, concerns, change in symptoms.   POSSIBLE SUTURE REMOVAL NEXT APPOINTMENT- LET ME SEE HER BEFORE REMOVING ANY SUTURES   [name]

## 2022-07-02 ENCOUNTER — Ambulatory Visit (INDEPENDENT_AMBULATORY_CARE_PROVIDER_SITE_OTHER): Payer: Medicare Other | Admitting: Podiatry

## 2022-07-02 DIAGNOSIS — M2022 Hallux rigidus, left foot: Secondary | ICD-10-CM

## 2022-07-02 MED ORDER — AMOXICILLIN-POT CLAVULANATE 875-125 MG PO TABS: 1.0000 | ORAL_TABLET | Freq: Two times a day (BID) | ORAL | 0 refills | Status: DC

## 2022-07-02 NOTE — Progress Notes (Signed)
Subjective: Chief Complaint  Patient presents with   Routine Post Op    POV # 2 DOS 06/17/22 --- LT FOOT REMOVAL OF HARDWARE, POSSIBLE REVISION OF 1ST MPJ FUSION - [facility] PT. Pt states she is doing well. No NV, fever or chills. No calf pain or signs of infections. Sutures intact. Possible suture removal today.     67 year old female presents the office today with above concerns.  She presents today for possible suture removal.  States that she is doing well.  She been walking the cam boot at home surgical shoe without any pain.  No fevers or chills.     Objective: AAO x3, NAD DP/PT pulses palpable bilaterally, CRT less than 3 seconds Incision to the left foot is well coapted with sutures intact.  There is no surrounding erythema, ascending cellulitis.  There is no fluctuance or crepitation.  There is no malodor.  No pain on exam.  Arthrodesis is stable. No pain with calf compression, swelling, warmth, erythema  Assessment: Status post hard removal left foot  Plan: -All treatment options discussed with the patient including all alternatives, risks, complications.  -I reviewed Sutures today.  Incisions well coapted.  Xeroform was applied followed by dressing.  She can change the dressing every couple days as needed.  I reviewed the culture with her, start Augmentin. -Monitor for any clinical signs or symptoms of infection and directed to call the office immediately should any occur or go to the ER.  I will see the patient prior to any suture removal next ointment.  No follow-ups on file.  Vivi Barrack DPM

## 2022-07-06 ENCOUNTER — Encounter: Payer: Self-pay | Admitting: *Deleted

## 2022-07-14 ENCOUNTER — Ambulatory Visit (INDEPENDENT_AMBULATORY_CARE_PROVIDER_SITE_OTHER): Payer: Medicare Other

## 2022-07-14 ENCOUNTER — Ambulatory Visit (INDEPENDENT_AMBULATORY_CARE_PROVIDER_SITE_OTHER): Payer: Medicare Other | Admitting: Podiatry

## 2022-07-14 DIAGNOSIS — M2022 Hallux rigidus, left foot: Secondary | ICD-10-CM | POA: Diagnosis not present

## 2022-07-14 DIAGNOSIS — T8484XA Pain due to internal orthopedic prosthetic devices, implants and grafts, initial encounter: Secondary | ICD-10-CM

## 2022-07-14 DIAGNOSIS — M21962 Unspecified acquired deformity of left lower leg: Secondary | ICD-10-CM

## 2022-07-14 DIAGNOSIS — T8130XA Disruption of wound, unspecified, initial encounter: Secondary | ICD-10-CM | POA: Diagnosis not present

## 2022-07-14 MED ORDER — AMOXICILLIN-POT CLAVULANATE 875-125 MG PO TABS: 1.0000 | ORAL_TABLET | Freq: Two times a day (BID) | ORAL | 0 refills | Status: DC

## 2022-07-15 LAB — CBC WITH DIFFERENTIAL/PLATELET
Basophils Absolute: 0 10*3/uL (ref 0.0–0.2)
Basos: 1 %
EOS (ABSOLUTE): 0.1 10*3/uL (ref 0.0–0.4)
Eos: 2 %
Hematocrit: 43 % (ref 34.0–46.6)
Hemoglobin: 13.9 g/dL (ref 11.1–15.9)
Immature Grans (Abs): 0 10*3/uL (ref 0.0–0.1)
Immature Granulocytes: 0 %
Lymphocytes Absolute: 2.1 10*3/uL (ref 0.7–3.1)
Lymphs: 37 %
MCH: 27.5 pg (ref 26.6–33.0)
MCHC: 32.3 g/dL (ref 31.5–35.7)
MCV: 85 fL (ref 79–97)
Monocytes Absolute: 0.6 10*3/uL (ref 0.1–0.9)
Monocytes: 11 %
Neutrophils Absolute: 2.8 10*3/uL (ref 1.4–7.0)
Neutrophils: 49 %
Platelets: 265 10*3/uL (ref 150–450)
RBC: 5.05 x10E6/uL (ref 3.77–5.28)
RDW: 12.9 % (ref 11.7–15.4)
WBC: 5.6 10*3/uL (ref 3.4–10.8)

## 2022-07-15 LAB — C-REACTIVE PROTEIN: CRP: <1 mg/L (ref 0–10)

## 2022-07-15 LAB — SEDIMENTATION RATE: Sed Rate: 20 mm/hr (ref 0–40)

## 2022-07-15 NOTE — Progress Notes (Signed)
Subjective: Chief Complaint  Patient presents with   Routine Post Op    POV  DOS 06/17/22 --- LT FOOT REMOVAL OF HARDWARE, POSSIBLE REVISION OF 1ST MPJ FUSION    67 year old female presents the office today with above concerns.  States that she is doing well and not had any pain.  She is changing the bandage some and she has not seen any drainage or pus or increasing swelling or redness.  She has not had any pain and she is eager to get back to her shoe.  No fevers or chills that she reports.    Objective: AAO x3, NAD DP/PT pulses palpable bilaterally, CRT less than 3 seconds Incision to the left foot is well coapted with sutures intact.  Incision appears to be healing well.  The distal portion incision with a wound that he has some irregularities that she is present but there is no probing, undermining or tunneling.  There is no drainage or pus.  There is no fluctuance or crepitation.  There is no malodor. No pain with calf compression, swelling, warmth, erythema       Assessment: Status post hard removal left foot  Plan: -All treatment options discussed with the patient including all alternatives, risks, complications.  -X-rays obtained reviewed.  On the screw tract on the proximal phalanx radiolucency is noted to the medial cortex. -Remove the sutures to any complications. -I reviewed the x-rays with the patient.  Discussed the changes compared to prior x-rays.  Discussed concern for infection versus fracture through the screw tracks.  I like to remain for immobilization for now and hold off on returning to regular shoe.  Will continue with Augmentin.  This is refilled today.  Will recheck CBC, sed rate, CRP. -Can continue with dressing changes. -Monitor for any clinical signs or symptoms of infection and directed to call the office immediately should any occur or go to the ER.   Return in about 10 days (around 07/24/2022) for post-op visit, x-ray.  Vivi Barrack DPM

## 2022-07-17 ENCOUNTER — Ambulatory Visit (INDEPENDENT_AMBULATORY_CARE_PROVIDER_SITE_OTHER): Payer: Medicare Other | Admitting: Family Medicine

## 2022-07-17 ENCOUNTER — Encounter: Payer: Self-pay | Admitting: Family Medicine

## 2022-07-17 VITALS — BP 112/70 | HR 99 | Temp 98.4°F | Ht 66.0 in | Wt 193.5 lb

## 2022-07-17 DIAGNOSIS — M65311 Trigger thumb, right thumb: Secondary | ICD-10-CM | POA: Diagnosis not present

## 2022-07-17 MED ORDER — METHYLPREDNISOLONE ACETATE 40 MG/ML IJ SUSP
20.0000 mg | Freq: Once | INTRAMUSCULAR | Status: AC
  Administered 2022-07-17: 20 mg via INTRA_ARTICULAR

## 2022-07-17 NOTE — Addendum Note (Signed)
Addended by: Scharlene Gloss B on: 07/17/2022 11:23 AM   Modules accepted: Orders

## 2022-07-17 NOTE — Patient Instructions (Signed)
Ice/cold pack over area for 10-15 min twice daily.  OK to take Tylenol 1000 mg (2 extra strength tabs) or 975 mg (3 regular strength tabs) every 6 hours as needed.  Where the splint when able.   Send me a message in 1 week if no better.   Let us know if you need anything.

## 2022-07-17 NOTE — Progress Notes (Signed)
Musculoskeletal Exam  Patient: Elaine Buck DOB: 08-15-1955  DOS: 07/17/2022  SUBJECTIVE:  Chief Complaint:   Chief Complaint  Patient presents with   Hand Pain    Right thumb pain     Elaine Buck is a 67 y.o.  female for evaluation and treatment of thumb pain.   Onset:  3 months ago. No inj or change in activity.  Location: palm side of R thumb Character:  aching and sharp  Progression of issue:  has worsened Associated symptoms: catching/triggering of thumb Treatment: to date has been: rest, ice, Tylenol.   Neurovascular symptoms: no  Past Medical History:  Diagnosis Date   Anxiety    Follows w/ PCP, Dr. Arva Buck.   Arthritis    left toes   Cancer (HCC)    breast (Left)   Complex regional pain syndrome of left lower extremity    type 1, follows with Dr. Metro Buck @ [facility], LOV 07/17/21   Dry eye syndrome    History of colon polyps    History of lobular carcinoma in situ (LCIS) of  left breast    s/p lumpectomy in 2005, Tamoxifen/Evista therapy for 5 years   Hyperlipidemia    Follows w/ PCP, Dr. Arva Buck.   Hypothyroidism    Follows w/ PCP, Dr. Arva Buck.   Leg cramping 09/2021   taking muscle relaxants at night   Neuromuscular disorder (HCC)    small fiber neuropathy, L foot , follows with Dr. Cannon Buck @ [facility].   Sacroiliac joint dysfunction of right side    Thyroid disease    Wears glasses    Wears hearing aid    Wears partial dentures    upper and lower    Objective: VITAL SIGNS: BP 112/70 (BP Location: Left Arm, Patient Position: Sitting, Cuff Size: Normal)   Pulse 99   Temp 98.4 F (36.9 C) (Oral)   Ht 5\' 6"  (1.676 m)   Wt 193 lb 8 oz (87.8 kg)   SpO2 99%   BMI 31.23 kg/m  Constitutional: Well formed, well developed. No acute distress. Thorax & Lungs: No accessory muscle use Musculoskeletal: R thumb.   Normal active range of motion: yes.   Normal passive  range of motion: yes Tenderness to palpation: yes, over flexor tendon  Deformity: no Ecchymosis: no Triggering noted.  Neurologic: Normal sensory function.  Psychiatric: Normal mood. Age appropriate judgment and insight. Alert & oriented x 3.    Procedure note: Trigger thumb injection Verbal consent obtained. The area of interest was palpated and just distal over the palmar MCP crease, the tendon was marked with pen.  The area marked was cleaned with alcohol and freeze spray was used for topical anesthesia. A 30-gauge needle was inserted at a 45 degree angle and once under the skin, continued in parallel fashion to the tendon, and used to inject 20 mg of Depo-Medrol and 0.5 mg of 1% lidocaine without epinephrine.  The plunger of the syringe was withdrawn prior to injecting the medication to ensure the needle was not in a blood vessel. A Band-Aid was placed. The patient tolerated the procedure well with no immediate complications noted.   Assessment:  Trigger finger of right thumb - Plan: PR INJECTION 1 TENDON SHEATH/LIGAMENT APONEUROSIS  Plan: Splinting in extension, heat, ice, Tylenol. Injection today. Refer hand if no better in 1 week.  F/u prn. The patient voiced understanding and agreement to the plan.   Elaine Buck [place], DO 07/17/22  11:14 AM

## 2022-07-24 ENCOUNTER — Encounter: Payer: Self-pay | Admitting: Podiatry

## 2022-07-24 ENCOUNTER — Ambulatory Visit (INDEPENDENT_AMBULATORY_CARE_PROVIDER_SITE_OTHER): Payer: Medicare Other | Admitting: Podiatry

## 2022-07-24 ENCOUNTER — Ambulatory Visit (INDEPENDENT_AMBULATORY_CARE_PROVIDER_SITE_OTHER): Payer: Medicare Other

## 2022-07-24 DIAGNOSIS — M2022 Hallux rigidus, left foot: Secondary | ICD-10-CM

## 2022-07-24 DIAGNOSIS — M21962 Unspecified acquired deformity of left lower leg: Secondary | ICD-10-CM

## 2022-07-24 DIAGNOSIS — T8484XA Pain due to internal orthopedic prosthetic devices, implants and grafts, initial encounter: Secondary | ICD-10-CM | POA: Diagnosis not present

## 2022-07-24 MED ORDER — METRONIDAZOLE 500 MG PO TABS: 500.0000 mg | ORAL_TABLET | Freq: Two times a day (BID) | ORAL | 0 refills | Status: AC

## 2022-07-24 MED ORDER — AMOXICILLIN-POT CLAVULANATE 875-125 MG PO TABS: 1.0000 | ORAL_TABLET | Freq: Two times a day (BID) | ORAL | 0 refills | Status: DC

## 2022-08-03 ENCOUNTER — Ambulatory Visit (INDEPENDENT_AMBULATORY_CARE_PROVIDER_SITE_OTHER): Payer: Medicare Other

## 2022-08-03 ENCOUNTER — Ambulatory Visit (INDEPENDENT_AMBULATORY_CARE_PROVIDER_SITE_OTHER): Payer: Medicare Other | Admitting: Podiatry

## 2022-08-03 ENCOUNTER — Encounter: Payer: Self-pay | Admitting: Podiatry

## 2022-08-03 DIAGNOSIS — M2022 Hallux rigidus, left foot: Secondary | ICD-10-CM

## 2022-08-03 DIAGNOSIS — T8484XA Pain due to internal orthopedic prosthetic devices, implants and grafts, initial encounter: Secondary | ICD-10-CM

## 2022-08-03 DIAGNOSIS — T8130XA Disruption of wound, unspecified, initial encounter: Secondary | ICD-10-CM

## 2022-08-03 DIAGNOSIS — M21962 Unspecified acquired deformity of left lower leg: Secondary | ICD-10-CM

## 2022-08-03 NOTE — Progress Notes (Signed)
Subjective: Chief Complaint  Patient presents with   Routine Post Op    DOS 06/17/22 --- LT FOOT REMOVAL OF HARDWARE, POSSIBLE REVISION OF 1ST MPJ FUSION     67 year old female presents the office today with above concerns.  She states overall she is feeling better compared to last visit.  She denies any drainage or pus.  Significant is over the wound daily.  She does not Dors any fevers or chills.  She is off of antibiotics at this time.   Objective: AAO x3, NAD DP/PT pulses palpable bilaterally, CRT less than 3 seconds Incision to the left foot is well and the wound distally is healing.  At the distal half of the incision where the wound was at there is callus formation is able debride this today and appears near scar is present.  There is no definitive skin breakdown there is no drainage or pus.  There is some trace edema there is no erythema or warmth.  There is no ascending cellulitis.  There is no fluctuance or crepitation.  No malodor.  Arthrodesis stable.  No significant pain on exam  No pain with calf compression, swelling, warmth, erythema     Assessment: Status post hard removal left foot  Plan: -All treatment options discussed with the patient including all alternatives, risks, complications.  -X-rays were reviewed.  3 views of the foot were obtained.  Does not present increased consolidation noted across the area of the radiolucency as well as the screw hole. -Overall she is looking better today.  She is off antibiotics or observe off of antibiotics but discussed that there is any changes at all to let me know immediately.  Recommend gradual started transition to regular shoe as tolerated I dispensed a graphite insert as well.  Discussed gradual transition to a shoe.  Later this week as long as incisions healing well still there is no opening she can start to get in her private pool.  Discussed aquatic therapy to help with her strength.  If needed consider formal physical  therapy.  X-ray next appointment   Vivi Barrack DPM

## 2022-08-03 NOTE — Progress Notes (Signed)
Subjective: Chief Complaint  Patient presents with   Routine Post Op    POV # 4 DOS 06/17/22 --- LT FOOT REMOVAL OF HARDWARE, POSSIBLE REVISION OF 1ST MPJ FUSION   "its bothering me some"    67 year old female presents the office today with above concerns.  She states that she is having some discomfort and she is concerned about some swelling and "hardness" at the 12 point just behind the toenail.  She does not report any fevers or chills.  Has not seen any drainage or pus.    Objective: AAO x3, NAD DP/PT pulses palpable bilaterally, CRT less than 3 seconds Incision to the left foot is well and the wound distally is healing.  There is hyperkeratotic tissue present on debrided this today and appears eschar is forming.  There is no probing, undermining or tunneling.  There is no exposed bone or tendon.  There is some localized edema and erythema but there is no drainage or pus, fluctuation, crepitation or malodor.  There is no ascending cellulitis.  No pain with calf compression, swelling, warmth, erythema   Assessment: Status post hard removal left foot  Plan: -All treatment options discussed with the patient including all alternatives, risks, complications.  -X-rays obtained reviewed.  On the screw tract on the proximal phalanx radiolucency is noted consistent with fracture.  Discussed possibility of infection with the patient but her blood work has been normal.  There is some edema erythema although very localized.  Will continue Augmentin, add metronidazole to her as well.  Recommend continue with daily dressing changes, offloading.  Manage surgical boot for now. -Monitor for any clinical signs or symptoms of infection and directed to call the office immediately should any occur or go to the ER. -This case was reviewed at intradepartmental conference. -Monitor for any clinical signs or symptoms of infection and directed to call the office immediately should any occur or go to the  ER.  Return in about 10 days (around 08/03/2022).  X-ray next appointment  Vivi Barrack DPM

## 2022-08-10 DIAGNOSIS — H04123 Dry eye syndrome of bilateral lacrimal glands: Secondary | ICD-10-CM | POA: Diagnosis not present

## 2022-08-24 ENCOUNTER — Encounter: Payer: Self-pay | Admitting: Podiatry

## 2022-08-25 ENCOUNTER — Encounter: Payer: Self-pay | Admitting: Podiatry

## 2022-09-01 ENCOUNTER — Encounter: Payer: Medicare Other | Admitting: Podiatry

## 2022-09-14 ENCOUNTER — Ambulatory Visit (INDEPENDENT_AMBULATORY_CARE_PROVIDER_SITE_OTHER): Payer: Medicare Other

## 2022-09-14 ENCOUNTER — Ambulatory Visit (INDEPENDENT_AMBULATORY_CARE_PROVIDER_SITE_OTHER): Payer: Medicare Other | Admitting: Podiatry

## 2022-09-14 DIAGNOSIS — M2022 Hallux rigidus, left foot: Secondary | ICD-10-CM

## 2022-09-14 DIAGNOSIS — M21962 Unspecified acquired deformity of left lower leg: Secondary | ICD-10-CM

## 2022-09-14 DIAGNOSIS — T8484XA Pain due to internal orthopedic prosthetic devices, implants and grafts, initial encounter: Secondary | ICD-10-CM

## 2022-09-15 ENCOUNTER — Ambulatory Visit (INDEPENDENT_AMBULATORY_CARE_PROVIDER_SITE_OTHER): Payer: Medicare Other | Admitting: Family Medicine

## 2022-09-15 ENCOUNTER — Encounter: Payer: Self-pay | Admitting: Family Medicine

## 2022-09-15 VITALS — BP 108/68 | HR 72 | Temp 98.5°F | Ht 66.0 in | Wt 184.2 lb

## 2022-09-15 DIAGNOSIS — E785 Hyperlipidemia, unspecified: Secondary | ICD-10-CM | POA: Diagnosis not present

## 2022-09-15 DIAGNOSIS — F419 Anxiety disorder, unspecified: Secondary | ICD-10-CM | POA: Diagnosis not present

## 2022-09-15 DIAGNOSIS — R252 Cramp and spasm: Secondary | ICD-10-CM | POA: Insufficient documentation

## 2022-09-15 DIAGNOSIS — E039 Hypothyroidism, unspecified: Secondary | ICD-10-CM | POA: Diagnosis not present

## 2022-09-15 LAB — LIPID PANEL
Cholesterol: 175 mg/dL (ref 0–200)
HDL: 44.1 mg/dL (ref >39.00)
LDL Cholesterol: 105 mg/dL — ABNORMAL HIGH (ref 0–99)
NonHDL: 130.77
Total CHOL/HDL Ratio: 4
Triglycerides: 131 mg/dL (ref 0.0–149.0)
VLDL: 26.2 mg/dL (ref 0.0–40.0)

## 2022-09-15 LAB — IBC + FERRITIN
Ferritin: 53.3 ng/mL (ref 10.0–291.0)
Iron: 107 ug/dL (ref 42–145)
Saturation Ratios: 27.8 % (ref 20.0–50.0)
TIBC: 385 ug/dL (ref 250.0–450.0)
Transferrin: 275 mg/dL (ref 212.0–360.0)

## 2022-09-15 LAB — COMPREHENSIVE METABOLIC PANEL
ALT: 24 U/L (ref 0–35)
AST: 27 U/L (ref 0–37)
Albumin: 4.5 g/dL (ref 3.5–5.2)
Alkaline Phosphatase: 88 U/L (ref 39–117)
BUN: 15 mg/dL (ref 6–23)
CO2: 26 mEq/L (ref 19–32)
Calcium: 9.5 mg/dL (ref 8.4–10.5)
Chloride: 106 mEq/L (ref 96–112)
Creatinine, Ser: 0.84 mg/dL (ref 0.40–1.20)
GFR: 71.65 mL/min (ref >60.00)
Glucose, Bld: 96 mg/dL (ref 70–99)
Potassium: 4.6 mEq/L (ref 3.5–5.1)
Sodium: 139 mEq/L (ref 135–145)
Total Bilirubin: 0.4 mg/dL (ref 0.2–1.2)
Total Protein: 6.9 g/dL (ref 6.0–8.3)

## 2022-09-15 LAB — T4, FREE: Free T4: 1.12 ng/dL (ref 0.60–1.60)

## 2022-09-15 LAB — TSH: TSH: 0.04 u[IU]/mL — ABNORMAL LOW (ref 0.35–5.50)

## 2022-09-15 MED ORDER — AMITRIPTYLINE HCL 10 MG PO TABS: 10.0000 mg | ORAL_TABLET | Freq: Every day | ORAL | 3 refills | Status: DC

## 2022-09-15 MED ORDER — VENLAFAXINE HCL ER 150 MG PO CP24: 150.0000 mg | ORAL_CAPSULE | Freq: Every day | ORAL | 3 refills | Status: DC

## 2022-09-15 MED ORDER — LOVASTATIN 10 MG PO TABS: 10.0000 mg | ORAL_TABLET | Freq: Every day | ORAL | 3 refills | Status: DC

## 2022-09-15 MED ORDER — LEVOTHYROXINE SODIUM 125 MCG PO TABS: 125.0000 ug | ORAL_TABLET | Freq: Every day | ORAL | 3 refills | Status: DC

## 2022-09-15 NOTE — Patient Instructions (Addendum)
Give [place] 2-3 business days to get the results of your labs back.   Keep the diet clean and stay active.  Let me know how much magnesium you are taking.   Consider a [place] sock to wear at night.   Let us know if you need anything.

## 2022-09-15 NOTE — Progress Notes (Signed)
Chief Complaint  Patient presents with   Follow-up    6 month     Subjective Elaine Buck presents for f/u anxiety/depression.  Pt is currently being treated with Effexor XR 150 mg/d.  Reports doing well since treatment. No thoughts of harming self or others. No self-medication with alcohol, prescription drugs or illicit drugs. Pt is not following with a counselor/psychologist.  Hyperlipidemia Patient presents for dyslipidemia follow up. Currently being treated with lovastatin and compliance with treatment thus far has been good. She denies myalgias. The patient is not known to have coexisting coronary artery disease.  Hypothyroidism Patient presents for follow-up of hypothyroidism.  Reports compliance with medication-levothyroxine 125 mcg daily. Current symptoms include: denies fatigue, weight changes, heat/cold intolerance, bowel/skin changes or CVS symptoms She believes her dose should be not significantly changed  Cramping Several year history of lower extremity cramping, mainly at night.  She also gets when she tries water, her podiatrist thinks it may be related to pointing her toes.  She usually gets in her calves.  She has failed several muscle relaxers, most recently Robaxin.  She has also tried pickle juice and mustard without relief.  She tries to stay well-hydrated overall.  She takes a magnesium supplement in addition to a B12 supplement.  Historically her electrolytes have been unremarkable.  No history of anemia.  Past Medical History:  Diagnosis Date   Anxiety    Follows w/ PCP, Dr. Arva Chafe.   Arthritis    left toes   Cancer (HCC)    breast (Left)   Complex regional pain syndrome of left lower extremity    type 1, follows with Dr. Metro Kung @ [facility], LOV 07/17/21   Dry eye syndrome    History of colon polyps    History of lobular carcinoma in situ (LCIS) of  left breast    s/p lumpectomy in 2005, Tamoxifen/Evista  therapy for 5 years   Hyperlipidemia    Follows w/ PCP, Dr. Arva Chafe.   Hypothyroidism    Follows w/ PCP, Dr. Arva Chafe.   Leg cramping 09/2021   taking muscle relaxants at night   Neuromuscular disorder (HCC)    small fiber neuropathy, L foot , follows with Dr. Cannon Kettle @ [facility].   Sacroiliac joint dysfunction of right side    Thyroid disease    Wears glasses    Wears hearing aid    Wears partial dentures    upper and lower   Allergies as of 09/15/2022       Reactions   Celebrex [celecoxib] Swelling   Other Hives, Itching   Peaches        Medication List        Accurate as of September 15, 2022 11:19 AM. If you have any questions, ask your nurse or doctor.          STOP taking these medications    methocarbamol 500 MG tablet Commonly known as: ROBAXIN Stopped by: Sharlene Dory, DO       TAKE these medications    amitriptyline 10 MG tablet Commonly known as: ELAVIL Take 1 tablet (10 mg total) by mouth at bedtime. Started by: Sharlene Dory, DO   b complex vitamins capsule Take 1 capsule by mouth daily.   Calcium Carbonate-Vitamin D 600-400 MG-UNIT tablet Take by mouth.   FISH OIL PO Take 1 tablet by mouth daily.   levothyroxine 125 MCG tablet Commonly known as: SYNTHROID Take 1 tablet (125  mcg total) by mouth daily.   lovastatin 10 MG tablet Commonly known as: MEVACOR Take 1 tablet (10 mg total) by mouth at bedtime.   multivitamin tablet Take 1 tablet by mouth daily.   topiramate 25 MG tablet Commonly known as: TOPAMAX Take 2 tablets (50 mg total) by mouth at bedtime.   venlafaxine XR 150 MG 24 hr capsule Commonly known as: Effexor XR Take 1 capsule (150 mg total) by mouth daily with breakfast.   VITAMIN D3 PO Take 1 tablet by mouth daily.        Exam BP 108/68 (BP Location: Left Arm, Patient Position: Sitting, Cuff Size: Normal)   Pulse 72   Temp 98.5 F (36.9 C) (Oral)    Ht 5\' 6"  (1.676 m)   Wt 184 lb 4 oz (83.6 kg)   SpO2 93%   BMI 29.74 kg/m  General:  well developed, well nourished, in no apparent distress Lungs:  No respiratory distress Psych: well oriented with normal range of affect and age-appropriate judgement/insight, alert and oriented x4.  Assessment and Plan  Anxiety - Plan: venlafaxine XR (EFFEXOR XR) 150 MG 24 hr capsule  Hyperlipidemia, unspecified hyperlipidemia type - Plan: lovastatin (MEVACOR) 10 MG tablet, Comprehensive metabolic panel, Lipid panel  Hypothyroidism, unspecified type - Plan: levothyroxine (SYNTHROID) 125 MCG tablet, TSH, T4, free  Muscle cramping - Plan: IBC + Ferritin  Chronic, stable.  Continue Effexor XR 150 mg daily. Chronic, hopefully stable.  Continue lovastatin 10 mg daily.  Check labs today. Chronic, stable.  Continue levothyroxine 125 mcg daily.  Check labs. Chronic, unstable.  Check iron levels.  We also see what her electrolytes are today.  Continue her magnesium supplement.  I did recommend a [place] sock to keep her foot in dorsiflexion to see if this helps reduce the positional trigger for her cramping.  Stay hydrated.  Could consider sports medicine referral if no improvement. F/u in 6 months. The patient voiced understanding and agreement to the plan.  Jilda Roche [place], DO 09/15/22 11:19 AM

## 2022-09-16 NOTE — Progress Notes (Signed)
Subjective: Chief Complaint  Patient presents with   Routine Post Op    lvm to r/s provider oof 08/25/2022 kr///(xray) POV # 6 DOS 06/17/22 --- LT FOOT REMOVAL OF HARDWARE, POSSIBLE REVISION OF 1ST MPJ FUSION Pt denies nausea or fever Pt states she is feeling good    67 year old female presents the office today with above concerns.  She states that she is doing well and having any significant pain.  Incisions healed well.  Estimate swelling.  She feels some tightness in general to the left foot but otherwise no significant pain.  Objective: AAO x3, NAD DP/PT pulses palpable bilaterally, CRT less than 3 seconds Incision to the left foot is well and the wound distally has healed and a scar has formed.  There is no sign of edema is no erythema.  There is no obvious signs of infection.  Arthrodesis that appears to be stable.  There is no tenderness palpation of the toe. No pain with calf compression, swelling, warmth, erythema     Assessment: Status post hard removal left foot  Plan: -All treatment options discussed with the patient including all alternatives, risks, complications.  -X-rays were reviewed.  3 views of the foot were obtained.  Radiolucency noted on the proximal phalanx but there is increased consolidation noted.  Focal changes suggest osteomyelitis at this time. -Overall she is doing much better.  She is in continue with regular shoes, activity gradually increase activity as tolerated.  Continue ice and elevate as well as compression of any residual edema.  Return in about 6 weeks (around 10/26/2022).  X-ray next appointment  Vivi Barrack DPM

## 2022-09-23 NOTE — Addendum Note (Signed)
Addended by: Radene Gunning on: 09/23/2022 05:10 PM   Modules accepted: Level of Service

## 2022-10-03 ENCOUNTER — Other Ambulatory Visit: Payer: Self-pay | Admitting: Family Medicine

## 2022-10-03 DIAGNOSIS — R252 Cramp and spasm: Secondary | ICD-10-CM

## 2022-10-08 ENCOUNTER — Other Ambulatory Visit: Payer: Self-pay | Admitting: Family Medicine

## 2022-10-08 DIAGNOSIS — R252 Cramp and spasm: Secondary | ICD-10-CM

## 2022-10-13 ENCOUNTER — Other Ambulatory Visit: Payer: Self-pay | Admitting: Family Medicine

## 2022-10-13 MED ORDER — AMITRIPTYLINE HCL 10 MG PO TABS: 10.0000 mg | ORAL_TABLET | Freq: Every day | ORAL | 3 refills | Status: DC

## 2022-10-14 ENCOUNTER — Ambulatory Visit (INDEPENDENT_AMBULATORY_CARE_PROVIDER_SITE_OTHER): Payer: Medicare Other | Admitting: *Deleted

## 2022-10-14 DIAGNOSIS — Z0000 Encounter for general adult medical examination without abnormal findings: Secondary | ICD-10-CM

## 2022-10-14 NOTE — Progress Notes (Signed)
Subjective:   Elaine Buck is a 67 y.o. female who presents for Medicare Annual (Subsequent) preventive examination.  Visit Complete: Virtual  I connected with  Elaine Buck on 10/14/22 by a audio enabled telemedicine application and verified that I am speaking with the correct person using two identifiers.  Patient Location: Home  Provider Location: Office/Clinic  I discussed the limitations of evaluation and management by telemedicine. The patient expressed understanding and agreed to proceed.  Patient Medicare AWV questionnaire was completed by the patient on 10/13/22; I have confirmed that all information answered by patient is correct and no changes since this date.  Review of Systems     Cardiac Risk Factors include: advanced age (>40men, >45 women);dyslipidemia     Objective:    Per patient no change in vitals since last visit; unable to obtain new vitals due to this being a telehealth visit.      10/14/2022   10:28 AM 03/19/2022    9:14 AM 05/19/2021   11:06 AM 04/29/2021   12:49 PM 06/10/2020   10:38 AM 03/11/2020   11:00 AM 10/04/2019   10:53 AM  Advanced Directives  Does Patient Have a Medical Advance Directive? Yes Yes Yes Yes Yes Yes Yes  Type of Estate agent of [place];Living will Healthcare Power of Elaine Buck;Living will Healthcare Power of Elaine Buck;Living will Healthcare Power of Elaine Buck of [place];Living will;Out of facility DNR (pink MOST or yellow form) Healthcare Power of Elaine Buck;Living will;Out of facility DNR (pink MOST or yellow form)   Does patient want to make changes to medical advance directive? No - Patient declined No - Patient declined     No - Patient declined  Copy of Healthcare Power of Attorney in Chart? Yes - validated most recent copy scanned in chart (See row information) No - copy requested No - copy requested        Current Medications (verified) Outpatient Encounter Medications as of  10/14/2022  Medication Sig   amitriptyline (ELAVIL) 10 MG tablet Take 1 tablet (10 mg total) by mouth at bedtime.   b complex vitamins capsule Take 1 capsule by mouth daily.   Calcium Carbonate-Vitamin D 600-400 MG-UNIT tablet Take by mouth.   Cholecalciferol (VITAMIN D3 PO) Take 1 tablet by mouth daily.   levothyroxine (SYNTHROID) 125 MCG tablet Take 1 tablet (125 mcg total) by mouth daily.   lovastatin (MEVACOR) 10 MG tablet Take 1 tablet (10 mg total) by mouth at bedtime.   Multiple Vitamin (MULTIVITAMIN) tablet Take 1 tablet by mouth daily.   Omega-3 Fatty Acids (FISH OIL PO) Take 1 tablet by mouth daily.   topiramate (TOPAMAX) 25 MG tablet Take 2 tablets (50 mg total) by mouth at bedtime.   venlafaxine XR (EFFEXOR XR) 150 MG 24 hr capsule Take 1 capsule (150 mg total) by mouth daily with breakfast.   [DISCONTINUED] methocarbamol (ROBAXIN) 500 MG tablet TAKE 1 TO 2 TABLETS BY MOUTH EVERY NIGHT AT BEDTIME   No facility-administered encounter medications on file as of 10/14/2022.    Allergies (verified) Celebrex [celecoxib] and Other   History: Past Medical History:  Diagnosis Date   Anxiety    Follows w/ PCP, Dr. Arva Buck.   Arthritis    left toes   Cancer (HCC)    breast (Left)   Complex regional pain syndrome of left lower extremity    type 1, follows with Dr. Metro Buck @ [facility], LOV 07/17/21   Dry eye syndrome  History of colon polyps    History of lobular carcinoma in situ (LCIS) of  left breast    s/p lumpectomy in 2005, Tamoxifen/Evista therapy for 5 years   Hyperlipidemia    Follows w/ PCP, Dr. Arva Buck.   Hypothyroidism    Follows w/ PCP, Dr. Arva Buck.   Leg cramping 09/2021   taking muscle relaxants at night   Neuromuscular disorder (HCC)    small fiber neuropathy, L foot , follows with Dr. Cannon Buck @ [facility].   Sacroiliac joint dysfunction of right side    Thyroid disease     Wears glasses    Wears hearing aid    Wears partial dentures    upper and lower   Past Surgical History:  Procedure Laterality Date   BREAST BIOPSY  2005   BREAST LUMPECTOMY Left 05/22/2003   LCIS Cells found in breast tissue   CARPAL TUNNEL RELEASE Bilateral    around 2003   COLONOSCOPY     04/15/2016, 11/24/2019, 01/25/2020, hx of multiple colon polyps   FOOT SURGERY Left 04/2019   joint inplantment in big toe    FOOT SURGERY Left 12/17/2021   left 1st MPJ fusion revision, 2nd hammer toe repair   GRAFT APPLICATION Left 03/19/2022   Procedure: GRAFT APPLICATION;  Surgeon: Elaine Buck, DPM;  Location: [facility];  Service: Podiatry;  Laterality: Left;   JOINT REPLACEMENT  February 2021   Left big toe   TUBAL LIGATION  1986   WOUND DEBRIDEMENT Left 03/19/2022   Procedure: DEBRIDEMENT WOUND/ULCER;  Surgeon: Elaine Buck, DPM;  Location: [facility];  Service: Podiatry;  Laterality: Left;   Family History  Problem Relation Age of Onset   Stroke Mother    Bipolar disorder Son    ADD / ADHD Son    Anxiety disorder Son    Depression Son    Colon cancer Neg Hx    Esophageal cancer Neg Hx    Stomach cancer Neg Hx    Rectal cancer Neg Hx    Social History   Socioeconomic History   Marital status: Married    Spouse name: Not on file   Number of children: 2   Years of education: Not on file   Highest education level: Some college, no degree  Occupational History   Occupation: retired  Tobacco Use   Smoking status: Every Day    Current packs/day: 0.80    Average packs/day: 0.8 packs/day for 20.0 years (16.0 ttl pk-yrs)    Types: Cigarettes   Smokeless tobacco: Never   Tobacco comments:    Declines quitting for now  Vaping Use   Vaping status: Never Used  Substance and Sexual Activity   Alcohol use: Yes    Comment: 2-3 glasses a month of wine   Drug use: No   Sexual activity: Yes    Partners: Male    Birth control/protection:  Post-menopausal  Other Topics Concern   Not on file  Social History Narrative   Left Handed   Lives in a one story home   Drinks one cup of coffee in the morning and Decaf in the evening    Social Determinants of Health   Financial Resource Strain: Low Risk  (10/13/2022)   Overall Financial Resource Strain (CARDIA)    Difficulty of Paying Living Expenses: Not hard at all  Food Insecurity: No Food Insecurity (10/13/2022)   Hunger Vital Sign    Worried About Running Out  of Food in the Last Year: Never true    Ran Out of Food in the Last Year: Never true  Transportation Needs: No Transportation Needs (10/13/2022)   PRAPARE - Administrator, Civil Service (Medical): No    Lack of Transportation (Non-Medical): No  Physical Activity: Inactive (10/13/2022)   Exercise Vital Sign    Days of Exercise per Week: 0 days    Minutes of Exercise per Session: 0 min  Stress: No Stress Concern Present (10/13/2022)   [employer] of Occupational Health - Occupational Stress Questionnaire    Feeling of Stress : Not at all  Social Connections: Moderately Isolated (10/13/2022)   Social Connection and Isolation Panel [NHANES]    Frequency of Communication with Friends and Family: Three times a week    Frequency of Social Gatherings with Friends and Family: Once a week    Attends Religious Services: Never    Database administrator or Organizations: No    Attends Engineer, structural: Never    Marital Status: Married    Tobacco Counseling Ready to quit: Not Answered Counseling given: Not Answered Tobacco comments: Declines quitting for now   Clinical Intake:  Pre-visit preparation completed: Yes  Pain : No/denies pain  Nutritional Risks: None Diabetes: No  How often do you need to have someone help you when you read instructions, pamphlets, or other written materials from your doctor or pharmacy?: 1 - Never  Interpreter Needed?: No  Information entered by :: [employer], CMA   Activities of Daily Living    10/13/2022    6:55 PM 03/19/2022    9:20 AM  In your present state of health, do you have any difficulty performing the following activities:  Hearing? 0 0  Vision? 0 0  Difficulty concentrating or making decisions? 0 0  Walking or climbing stairs? 0 1  Dressing or bathing? 0 0  Doing errands, shopping? 0   Preparing Food and eating ? N   Using the Toilet? N   In the past six months, have you accidently leaked urine? N   Do you have problems with loss of bowel control? N   Managing your Medications? N   Managing your Finances? N   Housekeeping or managing your Housekeeping? N     Patient Care Team: Sharlene Dory, DO as PCP - General (Family Medicine) Glendale Chard, DO as Consulting Physician (Neurology)  Indicate any recent Medical Services you may have received from other than [facility] providers in the past year (date may be approximate).     Assessment:   This is a routine wellness examination for Humphrey.  Hearing/Vision screen No results found.  Dietary issues and exercise activities discussed:     Goals Addressed   None    Depression Screen    10/14/2022   10:31 AM 09/15/2022    7:53 AM 03/18/2022   11:19 AM 05/19/2021   11:09 AM 04/29/2021   12:47 PM 02/24/2021    8:54 AM 01/07/2021    2:07 PM  PHQ 2/9 Scores  PHQ - 2 Score 0 0 0 0 0 0 0  PHQ- 9 Score  0         Fall Risk    10/13/2022    6:55 PM 09/15/2022    7:52 AM 03/18/2022   11:19 AM 05/19/2021   11:07 AM 04/29/2021   12:47 PM  Fall Risk   Falls in the past year? 0 0 0 0 0  Number falls in past yr: 0 0 0 0   Injury with Fall? 0 0 0 0   Risk for fall due to : No Fall Risks No Fall Risks No Fall Risks    Follow up Falls evaluation completed Falls evaluation completed Falls evaluation completed Falls prevention discussed     MEDICARE RISK AT HOME:   TIMED UP AND GO:  Was the test performed?  No    Cognitive Function:        10/14/2022    10:32 AM  6CIT Screen  What Year? 0 points  What month? 0 points  What time? 0 points  Count back from 20 0 points  Months in reverse 0 points  Repeat phrase 0 points  Total Score 0 points    Immunizations Immunization History  Administered Date(s) Administered   Influenza, High Dose Seasonal PF 12/31/2019, 01/01/2021   Influenza,inj,Quad PF,6+ Mos 01/21/2014, 12/28/2016, 01/14/2018, 12/06/2018   Influenza-Unspecified 01/01/2010, 12/22/2015, 12/31/2019, 12/07/2021   PFIZER(Purple Top)SARS-COV-2 Vaccination 06/02/2019, 06/23/2019, 11/26/2019, 06/26/2020, 01/14/2021   PNEUMOCOCCAL CONJUGATE-20 02/24/2021   Pfizer Covid-19 Vaccine Bivalent Booster 30yrs & up 01/14/2021   Pneumococcal Polysaccharide-23 10/21/2016   Tdap 12/28/2016   Zoster Recombinant(Shingrix) 01/14/2018, 08/19/2018   Zoster, Live 03/23/2012    TDAP status: Up to date  Flu Vaccine status: Up to date  Pneumococcal vaccine status: Up to date  Covid-19 vaccine status: Information provided on how to obtain vaccines.   Qualifies for Shingles Vaccine? Yes   Zostavax completed Yes   Shingrix Completed?: Yes  Screening Tests Health Maintenance  Topic Date Due   COVID-19 Vaccine (6 - 2023-24 season) 11/21/2021   Medicare Annual Wellness (AWV)  05/19/2022   INFLUENZA VACCINE  10/22/2022   MAMMOGRAM  11/18/2023   Colonoscopy  01/24/2025   DTaP/Tdap/Td (2 - Td or Tdap) 12/29/2026   Pneumonia Vaccine 42+ Years old  Completed   DEXA SCAN  Completed   Hepatitis C Screening  Completed   Zoster Vaccines- Shingrix  Completed   HPV VACCINES  Aged Out    Health Maintenance  Health Maintenance Due  Topic Date Due   COVID-19 Vaccine (6 - 2023-24 season) 11/21/2021   Medicare Annual Wellness (AWV)  05/19/2022    Colorectal cancer screening: Type of screening: Colonoscopy. Completed 01/25/20. Repeat every 5 years  Mammogram status: Completed 11/17/21. Repeat every year  Bone Density status: Completed 06/19/21.  Results reflect: Bone density results: OSTEOPENIA. Repeat every 2 years.  Lung Cancer Screening: (Low Dose CT Chest recommended if Age 50-80 years, 20 pack-year currently smoking OR have quit w/in 15years.) does not qualify.   Additional Screening:  Hepatitis C Screening: does qualify; Completed 06/21/16  Vision Screening: Recommended annual ophthalmology exams for early detection of glaucoma and other disorders of the eye. Is the patient up to date with their annual eye exam?  Yes  Who is the provider or what is the name of the office in which the patient attends annual eye exams? [facility] If pt is not established with a provider, would they like to be referred to a provider to establish care? No .   Dental Screening: Recommended annual dental exams for proper oral hygiene  Diabetic Foot Exam: N/a  Community Resource Referral / Chronic Care Management: CRR required this visit?  No   CCM required this visit?  No     Plan:     I have personally reviewed and noted the following in the patient's chart:   Medical and  social history Use of alcohol, tobacco or illicit drugs  Current medications and supplements including opioid prescriptions. Patient is not currently taking opioid prescriptions. Functional ability and status Nutritional status Physical activity Advanced directives List of other physicians Hospitalizations, surgeries, and ER visits in previous 12 months Vitals Screenings to include cognitive, depression, and falls Referrals and appointments  In addition, I have reviewed and discussed with patient certain preventive protocols, quality metrics, and best practice recommendations. A written personalized care plan for preventive services as well as general preventive health recommendations were provided to patient.     Donne Anon, CMA   10/14/2022   After Visit Summary: (MyChart) Due to this being a telephonic visit, the after visit summary with  patients personalized plan was offered to patient via MyChart   Nurse Notes: None

## 2022-10-14 NOTE — Patient Instructions (Signed)
Ms. Dipierro , Thank you for taking time to come for your Medicare Wellness Visit. I appreciate your ongoing commitment to your health goals. Please review the following plan we discussed and let me know if I can assist you in the future.     This is a list of the screening recommended for you and due dates:  Health Maintenance  Topic Date Due   COVID-19 Vaccine (6 - 2023-24 season) 11/21/2021   Flu Shot  10/22/2022   Medicare Annual Wellness Visit  10/14/2023   Mammogram  11/18/2023   Colon Cancer Screening  01/24/2025   DTaP/Tdap/Td vaccine (2 - Td or Tdap) 12/29/2026   Pneumonia Vaccine  Completed   DEXA scan (bone density measurement)  Completed   Hepatitis C Screening  Completed   Zoster (Shingles) Vaccine  Completed   HPV Vaccine  Aged Out     Next appointment: Follow up in one year for your annual wellness visit.   Preventive Care 68 Years and Older, Female Preventive care refers to lifestyle choices and visits with your health care provider that can promote health and wellness. What does preventive care include? A yearly physical exam. This is also called an annual well check. Dental exams once or twice a year. Routine eye exams. Ask your health care provider how often you should have your eyes checked. Personal lifestyle choices, including: Daily care of your teeth and gums. Regular physical activity. Eating a healthy diet. Avoiding tobacco and drug use. Limiting alcohol use. Practicing safe sex. Taking low-dose aspirin every day. Taking vitamin and mineral supplements as recommended by your health care provider. What happens during an annual well check? The services and screenings done by your health care provider during your annual well check will depend on your age, overall health, lifestyle risk factors, and family history of disease. Counseling  Your health care provider may ask you questions about your: Alcohol use. Tobacco use. Drug use. Emotional  well-being. Home and relationship well-being. Sexual activity. Eating habits. History of falls. Memory and ability to understand (cognition). Work and work Astronomer. Reproductive health. Screening  You may have the following tests or measurements: Height, weight, and BMI. Blood pressure. Lipid and cholesterol levels. These may be checked every 5 years, or more frequently if you are over 38 years old. Skin check. Lung cancer screening. You may have this screening every year starting at age 71 if you have a 30-pack-year history of smoking and currently smoke or have quit within the past 15 years. Fecal occult blood test (FOBT) of the stool. You may have this test every year starting at age 76. Flexible sigmoidoscopy or colonoscopy. You may have a sigmoidoscopy every 5 years or a colonoscopy every 10 years starting at age 60. Hepatitis C blood test. Hepatitis B blood test. Sexually transmitted disease (STD) testing. Diabetes screening. This is done by checking your blood sugar (glucose) after you have not eaten for a while (fasting). You may have this done every 1-3 years. Bone density scan. This is done to screen for osteoporosis. You may have this done starting at age 104. Mammogram. This may be done every 1-2 years. Talk to your health care provider about how often you should have regular mammograms. Talk with your health care provider about your test results, treatment options, and if necessary, the need for more tests. Vaccines  Your health care provider may recommend certain vaccines, such as: Influenza vaccine. This is recommended every year. Tetanus, diphtheria, and acellular pertussis (Tdap, Td)  vaccine. You may need a Td booster every 10 years. Zoster vaccine. You may need this after age 8. Pneumococcal 13-valent conjugate (PCV13) vaccine. One dose is recommended after age 39. Pneumococcal polysaccharide (PPSV23) vaccine. One dose is recommended after age 55. Talk to your  health care provider about which screenings and vaccines you need and how often you need them. This information is not intended to replace advice given to you by your health care provider. Make sure you discuss any questions you have with your health care provider. Document Released: 04/05/2015 Document Revised: 11/27/2015 Document Reviewed: 01/08/2015 Elsevier Interactive Patient Education  2017 [employer].  Fall Prevention in the Home Falls can cause injuries. They can happen to people of all ages. There are many things you can do to make your home safe and to help prevent falls. What can I do on the outside of my home? Regularly fix the edges of walkways and driveways and fix any cracks. Remove anything that might make you trip as you walk through a door, such as a raised step or threshold. Trim any bushes or trees on the path to your home. Use bright outdoor lighting. Clear any walking paths of anything that might make someone trip, such as rocks or tools. Regularly check to see if handrails are loose or broken. Make sure that both sides of any steps have handrails. Any raised decks and porches should have guardrails on the edges. Have any leaves, snow, or ice cleared regularly. Use sand or salt on walking paths during winter. Clean up any spills in your garage right away. This includes oil or grease spills. What can I do in the bathroom? Use night lights. Install grab bars by the toilet and in the tub and shower. Do not use towel bars as grab bars. Use non-skid mats or decals in the tub or shower. If you need to sit down in the shower, use a plastic, non-slip stool. Keep the floor dry. Clean up any water that spills on the floor as soon as it happens. Remove soap buildup in the tub or shower regularly. Attach bath mats securely with double-sided non-slip rug tape. Do not have throw rugs and other things on the floor that can make you trip. What can I do in the bedroom? Use night  lights. Make sure that you have a light by your bed that is easy to reach. Do not use any sheets or blankets that are too big for your bed. They should not hang down onto the floor. Have a firm chair that has side arms. You can use this for support while you get dressed. Do not have throw rugs and other things on the floor that can make you trip. What can I do in the kitchen? Clean up any spills right away. Avoid walking on wet floors. Keep items that you use a lot in easy-to-reach places. If you need to reach something above you, use a strong step stool that has a grab bar. Keep electrical cords out of the way. Do not use floor polish or wax that makes floors slippery. If you must use wax, use non-skid floor wax. Do not have throw rugs and other things on the floor that can make you trip. What can I do with my stairs? Do not leave any items on the stairs. Make sure that there are handrails on both sides of the stairs and use them. Fix handrails that are broken or loose. Make sure that handrails are as long  as the stairways. Check any carpeting to make sure that it is firmly attached to the stairs. Fix any carpet that is loose or worn. Avoid having throw rugs at the top or bottom of the stairs. If you do have throw rugs, attach them to the floor with carpet tape. Make sure that you have a light switch at the top of the stairs and the bottom of the stairs. If you do not have them, ask someone to add them for you. What else can I do to help prevent falls? Wear shoes that: Do not have high heels. Have rubber bottoms. Are comfortable and fit you well. Are closed at the toe. Do not wear sandals. If you use a stepladder: Make sure that it is fully opened. Do not climb a closed stepladder. Make sure that both sides of the stepladder are locked into place. Ask someone to hold it for you, if possible. Clearly mark and make sure that you can see: Any grab bars or handrails. First and last  steps. Where the edge of each step is. Use tools that help you move around (mobility aids) if they are needed. These include: Canes. Walkers. Scooters. Crutches. Turn on the lights when you go into a dark area. Replace any light bulbs as soon as they burn out. Set up your furniture so you have a clear path. Avoid moving your furniture around. If any of your floors are uneven, fix them. If there are any pets around you, be aware of where they are. Review your medicines with your doctor. Some medicines can make you feel dizzy. This can increase your chance of falling. Ask your doctor what other things that you can do to help prevent falls. This information is not intended to replace advice given to you by your health care provider. Make sure you discuss any questions you have with your health care provider. Document Released: 01/03/2009 Document Revised: 08/15/2015 Document Reviewed: 04/13/2014 Elsevier Interactive Patient Education  2017 [employer].

## 2022-10-22 ENCOUNTER — Ambulatory Visit: Payer: Medicare Other | Admitting: Family Medicine

## 2022-10-23 ENCOUNTER — Ambulatory Visit (INDEPENDENT_AMBULATORY_CARE_PROVIDER_SITE_OTHER): Payer: Medicare Other

## 2022-10-23 ENCOUNTER — Ambulatory Visit (INDEPENDENT_AMBULATORY_CARE_PROVIDER_SITE_OTHER): Payer: Medicare Other | Admitting: Sports Medicine

## 2022-10-23 DIAGNOSIS — M48061 Spinal stenosis, lumbar region without neurogenic claudication: Secondary | ICD-10-CM | POA: Diagnosis not present

## 2022-10-23 DIAGNOSIS — M7052 Other bursitis of knee, left knee: Secondary | ICD-10-CM | POA: Diagnosis not present

## 2022-10-23 DIAGNOSIS — M4726 Other spondylosis with radiculopathy, lumbar region: Secondary | ICD-10-CM | POA: Diagnosis not present

## 2022-10-23 DIAGNOSIS — M1712 Unilateral primary osteoarthritis, left knee: Secondary | ICD-10-CM | POA: Insufficient documentation

## 2022-10-23 DIAGNOSIS — M545 Low back pain, unspecified: Secondary | ICD-10-CM | POA: Diagnosis not present

## 2022-10-23 DIAGNOSIS — M5116 Intervertebral disc disorders with radiculopathy, lumbar region: Secondary | ICD-10-CM | POA: Diagnosis not present

## 2022-10-23 MED ORDER — AMITRIPTYLINE HCL 50 MG PO TABS
ORAL_TABLET | ORAL | 3 refills | Status: DC
Start: 2022-10-23 — End: 2023-02-17

## 2022-10-23 MED ORDER — PREDNISONE 50 MG PO TABS
ORAL_TABLET | ORAL | 0 refills | Status: DC
Start: 2022-10-23 — End: 2022-12-01

## 2022-10-23 MED ORDER — TRAMADOL HCL 50 MG PO TABS
50.0000 mg | ORAL_TABLET | Freq: Three times a day (TID) | ORAL | 0 refills | Status: DC | PRN
Start: 2022-10-23 — End: 2022-12-01

## 2022-10-23 NOTE — Progress Notes (Signed)
    Procedures performed today:    None.  Independent interpretation of notes and tests performed by another provider:   None.  Brief History, Exam, Impression, and Recommendations:    Lumbar spinal stenosis Pleasant 67 year old female, chronic axial low back pain this time with left-sided radicular symptoms, adding prednisone, updated x-rays, Flexeril was not helpful, she was switched to amitriptyline by her PCP which did seem to help, we will increase amitriptyline to 50 mg nightly. Home physical therapy given. Tramadol for breakthrough pain. Return to see me in about 6 weeks, MR for interventional planning if not better.  Pes anserinus bursitis of left knee Elaine Buck has developed pain and swelling around the pes bursa on the left side, adding some home conditioning, the above medications will help as well.    ____________________________________________ Elaine Buck. Elaine Buck, M.D., ABFM., CAQSM., AME. Primary Care and Sports Medicine [facility]  Adjunct Professor of Family Medicine  [place] [employer] [facility] [employer]  Restaurant manager, fast food

## 2022-10-23 NOTE — Assessment & Plan Note (Signed)
Aralyn has developed pain and swelling around the pes bursa on the left side, adding some home conditioning, the above medications will help as well.

## 2022-10-23 NOTE — Assessment & Plan Note (Signed)
Pleasant 67 year old female, chronic axial low back pain this time with left-sided radicular symptoms, adding prednisone, updated x-rays, Flexeril was not helpful, she was switched to amitriptyline by her PCP which did seem to help, we will increase amitriptyline to 50 mg nightly. Home physical therapy given. Tramadol for breakthrough pain. Return to see me in about 6 weeks, MR for interventional planning if not better.

## 2022-10-26 ENCOUNTER — Ambulatory Visit (INDEPENDENT_AMBULATORY_CARE_PROVIDER_SITE_OTHER): Payer: Medicare Other

## 2022-10-26 ENCOUNTER — Ambulatory Visit (INDEPENDENT_AMBULATORY_CARE_PROVIDER_SITE_OTHER): Payer: Medicare Other | Admitting: Podiatry

## 2022-10-26 DIAGNOSIS — M2022 Hallux rigidus, left foot: Secondary | ICD-10-CM

## 2022-10-27 NOTE — Progress Notes (Signed)
Subjective: Chief Complaint  Patient presents with   Routine Post Op    Left foot    67 year old female presents the office today with above concerns.  States that she is doing well.  She is back up in a regular shoe and doing normal activities without any issues.  Some occasional discomfort she points to submetatarsal 2 area at times but its intermittent.  There is no injuries that she notes.  No fevers or chills.   Objective: AAO x3, NAD DP/PT pulses palpable bilaterally, CRT less than 3 seconds Incision to the left foot is well and the wound distally has healed and a scar has formed.  Arthrodesis sites appear to be stable.  There is trace edema present no erythema and there is no warmth.  There is no open lesions.  No obvious signs of infection.  There is no pain on exam to the left foot. No pain with calf compression, swelling, warmth, erythema     Assessment: Status post hard removal left foot  Plan: -All treatment options discussed with the patient including all alternatives, risks, complications.  -X-rays were reviewed.  3 views of the foot were obtained.  Radiolucency noted on the proximal phalanx consistent with fracture.  I do not think at this time this is infection.  -As she is back into regular she do her normal activities we discussed activity as tolerated.  Should she notice any increased discomfort, swelling or redness or any signs of infection to let me know. -I will see her back in 6 months for a long term follow up, however she is to let me know if she has any changes/concerns/new issues. She agrees with this plan.  Return in about 6 months (around 04/28/2023). X-ray next appointment   Vivi Barrack DPM

## 2022-11-19 ENCOUNTER — Telehealth: Payer: Self-pay | Admitting: Family Medicine

## 2022-11-19 DIAGNOSIS — E039 Hypothyroidism, unspecified: Secondary | ICD-10-CM

## 2022-11-19 NOTE — Telephone Encounter (Signed)
[facility] called to get approval on manufacturer change for pt's levothyroxine. Please Advise.

## 2022-11-19 NOTE — Telephone Encounter (Signed)
That's fine if original no longer covered/carried. Ty.

## 2022-11-20 MED ORDER — LEVOTHYROXINE SODIUM 125 MCG PO TABS
125.0000 ug | ORAL_TABLET | Freq: Every day | ORAL | 3 refills | Status: DC
Start: 2022-11-20 — End: 2023-07-23

## 2022-11-20 NOTE — Addendum Note (Signed)
Addended by: Scharlene Gloss B on: 11/20/2022 07:44 AM   Modules accepted: Orders

## 2022-11-20 NOTE — Telephone Encounter (Signed)
Sent in

## 2022-12-01 ENCOUNTER — Encounter: Payer: Self-pay | Admitting: Sports Medicine

## 2022-12-01 ENCOUNTER — Ambulatory Visit (INDEPENDENT_AMBULATORY_CARE_PROVIDER_SITE_OTHER): Payer: Medicare Other | Admitting: Sports Medicine

## 2022-12-01 DIAGNOSIS — M7052 Other bursitis of knee, left knee: Secondary | ICD-10-CM | POA: Diagnosis not present

## 2022-12-01 DIAGNOSIS — M48061 Spinal stenosis, lumbar region without neurogenic claudication: Secondary | ICD-10-CM | POA: Diagnosis not present

## 2022-12-01 MED ORDER — METHYLPREDNISOLONE SODIUM SUCC 125 MG IJ SOLR
125.0000 mg | Freq: Once | INTRAMUSCULAR | Status: AC
Start: 2022-12-01 — End: 2022-12-01
  Administered 2022-12-01: 125 mg via INTRAVENOUS

## 2022-12-01 MED ORDER — HYDROCODONE-ACETAMINOPHEN 10-325 MG PO TABS: 1.0000 | ORAL_TABLET | Freq: Three times a day (TID) | ORAL | 0 refills | Status: DC | PRN

## 2022-12-01 NOTE — Assessment & Plan Note (Signed)
Persistent and severe axial pain worse with standing and no better after 6 weeks of physical therapy, amitriptyline increased to 50 mg daily, steroids, tramadol. Proceeding with MRI for interventional planning, pain is right-sided and axial. Adding additional home physical therapy, high-dose hydrocodone. Solu-Medrol 125 intramuscular today.

## 2022-12-01 NOTE — Assessment & Plan Note (Signed)
Persistent pain left knee near the pes bursa, we did a pes bursa injection today and she really did not have any relief, not even temporary suggesting that the pain may be coming from elsewhere. She has now had greater than 6 weeks of physical therapy, unrevealing x-rays, I would like an MRI of her knee to evaluate for the pes bursitis versus subchondral insufficiency fracture.

## 2022-12-01 NOTE — Progress Notes (Signed)
    Procedures performed today:    Procedure:  Injection of left pes anserine bursa Consent obtained and verified. Time-out conducted. Noted no overlying erythema, induration, or other signs of local infection. Skin prepped in a sterile fashion. Topical analgesic spray: Ethyl chloride. Completed without difficulty. Meds: 1 cc kenalog 40, 4 cc lidocaine injected in a fanlike pattern into the affected area. Advised to call if fevers/chills, erythema, induration, drainage, or persistent bleeding.  Independent interpretation of notes and tests performed by another provider:   None.  Brief History, Exam, Impression, and Recommendations:    Lumbar spinal stenosis Persistent and severe axial pain worse with standing and no better after 6 weeks of physical therapy, amitriptyline increased to 50 mg daily, steroids, tramadol. Proceeding with MRI for interventional planning, pain is right-sided and axial. Adding additional home physical therapy, high-dose hydrocodone. Solu-Medrol 125 intramuscular today.  Pes anserinus bursitis of left knee Persistent pain left knee near the pes bursa, we did a pes bursa injection today and she really did not have any relief, not even temporary suggesting that the pain may be coming from elsewhere. She has now had greater than 6 weeks of physical therapy, unrevealing x-rays, I would like an MRI of her knee to evaluate for the pes bursitis versus subchondral insufficiency fracture.    ____________________________________________ Ihor Austin. Benjamin Stain, M.D., ABFM., CAQSM., AME. Primary Care and Sports Medicine [facility]  Adjunct Professor of Family Medicine  [employer] [facility] [employer]  Restaurant manager, fast food

## 2022-12-01 NOTE — Addendum Note (Signed)
Addended by: Carren Rang A on: 12/01/2022 11:06 AM   Modules accepted: Orders

## 2022-12-06 ENCOUNTER — Ambulatory Visit: Payer: Medicare Other

## 2022-12-06 DIAGNOSIS — M7052 Other bursitis of knee, left knee: Secondary | ICD-10-CM | POA: Diagnosis not present

## 2022-12-06 DIAGNOSIS — M48061 Spinal stenosis, lumbar region without neurogenic claudication: Secondary | ICD-10-CM | POA: Diagnosis not present

## 2022-12-06 DIAGNOSIS — M5136 Other intervertebral disc degeneration, lumbar region: Secondary | ICD-10-CM | POA: Diagnosis not present

## 2022-12-06 DIAGNOSIS — M25562 Pain in left knee: Secondary | ICD-10-CM | POA: Diagnosis not present

## 2022-12-06 DIAGNOSIS — M47816 Spondylosis without myelopathy or radiculopathy, lumbar region: Secondary | ICD-10-CM | POA: Diagnosis not present

## 2022-12-06 DIAGNOSIS — M5137 Other intervertebral disc degeneration, lumbosacral region: Secondary | ICD-10-CM | POA: Diagnosis not present

## 2022-12-07 ENCOUNTER — Encounter: Payer: Self-pay | Admitting: Podiatry

## 2022-12-08 ENCOUNTER — Ambulatory Visit: Payer: Medicare Other | Admitting: Podiatry

## 2022-12-08 ENCOUNTER — Ambulatory Visit (INDEPENDENT_AMBULATORY_CARE_PROVIDER_SITE_OTHER): Payer: Medicare Other

## 2022-12-08 DIAGNOSIS — M84375K Stress fracture, left foot, subsequent encounter for fracture with nonunion: Secondary | ICD-10-CM | POA: Diagnosis not present

## 2022-12-08 DIAGNOSIS — M2022 Hallux rigidus, left foot: Secondary | ICD-10-CM

## 2022-12-08 MED ORDER — IBUPROFEN 800 MG PO TABS: 800.0000 mg | ORAL_TABLET | Freq: Three times a day (TID) | ORAL | 0 refills | Status: DC | PRN

## 2022-12-10 NOTE — Progress Notes (Signed)
Subjective: 67 year old female presents today for acute appointment given increased pain to left foot.  She recently was in [place] over the weekend walking in a regular shoe without any pain and then yesterday she started increased pain and swelling.  She does not recall any recent injuries.  No open lesions that she reports.  No fevers or chills.  Objective: AAO x3, NAD DP/PT pulses palpable bilaterally, CRT less than 3 seconds On the left foot the incision is well coapted with scar formed there is no open lesions.  There is no erythema there is any increased temperature noted.  There is however edema present with first MPJ mostly on the medial aspect.  There is no proximal pain. No pain with calf compression, swelling, warmth, erythema  Assessment: 67 year old female with swelling, pain left foot; nonunion fracture  Plan: -All treatment options discussed with the patient including all alternatives, risks, complications.  -X-rays obtained reviewed.  3 views of the foot were obtained.  Did not appear to be significantly changed compared to prior x-rays.  There is a radiolucent line of the proximal phalanx this does correspond where she has pain does not appear to be significantly worse compared to prior x-rays but this is consistent with a nonunion with fracture gap less than 1 cm.   -I do this is more of a fracture from where the plate, screw was removed.  Clinically does not appear to be infected but he is from overuse from walking over the weekend.  Advised her to return to the surgical shoe for now.  Ibuprofen ordered. -Bone stimulator ordered -Patient encouraged to call the office with any questions, concerns, change in symptoms.  Vivi Barrack DPM

## 2022-12-18 ENCOUNTER — Other Ambulatory Visit (HOSPITAL_BASED_OUTPATIENT_CLINIC_OR_DEPARTMENT_OTHER): Payer: Self-pay | Admitting: Family Medicine

## 2022-12-18 DIAGNOSIS — Z1231 Encounter for screening mammogram for malignant neoplasm of breast: Secondary | ICD-10-CM

## 2022-12-22 ENCOUNTER — Ambulatory Visit (INDEPENDENT_AMBULATORY_CARE_PROVIDER_SITE_OTHER): Payer: Medicare Other

## 2022-12-22 ENCOUNTER — Ambulatory Visit (INDEPENDENT_AMBULATORY_CARE_PROVIDER_SITE_OTHER): Payer: Medicare Other | Admitting: Podiatry

## 2022-12-22 ENCOUNTER — Ambulatory Visit: Payer: Medicare Other

## 2022-12-22 DIAGNOSIS — M2022 Hallux rigidus, left foot: Secondary | ICD-10-CM

## 2022-12-22 DIAGNOSIS — L603 Nail dystrophy: Secondary | ICD-10-CM | POA: Diagnosis not present

## 2022-12-22 DIAGNOSIS — L608 Other nail disorders: Secondary | ICD-10-CM | POA: Diagnosis not present

## 2022-12-23 ENCOUNTER — Encounter (INDEPENDENT_AMBULATORY_CARE_PROVIDER_SITE_OTHER): Payer: Medicare Other | Admitting: Sports Medicine

## 2022-12-23 DIAGNOSIS — M48061 Spinal stenosis, lumbar region without neurogenic claudication: Secondary | ICD-10-CM | POA: Diagnosis not present

## 2022-12-23 NOTE — Telephone Encounter (Signed)

## 2022-12-24 ENCOUNTER — Encounter (HOSPITAL_BASED_OUTPATIENT_CLINIC_OR_DEPARTMENT_OTHER): Payer: Self-pay

## 2022-12-24 ENCOUNTER — Ambulatory Visit (HOSPITAL_BASED_OUTPATIENT_CLINIC_OR_DEPARTMENT_OTHER)
Admission: RE | Admit: 2022-12-24 | Discharge: 2022-12-24 | Disposition: A | Discharge status: Home / Self Care | Payer: Medicare Other | Source: Ambulatory Visit | Attending: Family Medicine | Admitting: Family Medicine

## 2022-12-24 DIAGNOSIS — Z1231 Encounter for screening mammogram for malignant neoplasm of breast: Secondary | ICD-10-CM | POA: Insufficient documentation

## 2022-12-24 NOTE — Progress Notes (Signed)
Subjective: 67 year old female presents today for follow-up evaluation of left foot pain.  She states that yesterday she went back to wearing a regular shoe which has been doing well.  Some mild swelling but overall doing much better and her pain is resolved when she had.  No open lesions she reports.  She has not yet received the bone stimulator.  As an aside she does have concerns about nail fungus.  She previously was on Lamisil.  Currently no treatment.  Nails are thick and discolored she reports.  Objective: AAO x3, NAD DP/PT pulses palpable bilaterally, CRT less than 3 seconds On the left foot the incision is well coapted with scar formed there is no open lesions.  Mild edema still present but overall much improved.  No erythema or warmth.  No pain on exam.   The nails are hypertrophic, dystrophic with brown discoloration with some old debris present.  No edema no erythema or signs of infection. No pain with calf compression, swelling, warmth, erythema  Assessment: 67 year old female with swelling, pain left foot; nonunion fracture  Plan: -All treatment options discussed with the patient including all alternatives, risks, complications.  -X-rays obtained reviewed.  3 views of the foot were obtained.  Did not appear to be significantly changed compared to prior x-rays.  There is a radiolucent line of the proximal phalanx this does correspond where she has pain does not appear to be significantly worse compared to prior x-rays but this is consistent with a nonunion with fracture gap less than 1 cm.  This is unchanged. -Overall symptoms much improved.  Think this was still due to overuse.  Continue supportive shoe gear, stiffer soled shoe. -Except for the nails and complications or weakness of this for culture, pathology to [facility].   Return if symptoms worsen or fail to improve.  Vivi Barrack DPM

## 2022-12-24 NOTE — Addendum Note (Signed)
Addended by: Monica Becton on: 12/24/2022 12:35 PM   Modules accepted: Orders

## 2022-12-29 ENCOUNTER — Ambulatory Visit (INDEPENDENT_AMBULATORY_CARE_PROVIDER_SITE_OTHER): Payer: Medicare Other | Admitting: Sports Medicine

## 2022-12-29 ENCOUNTER — Other Ambulatory Visit (INDEPENDENT_AMBULATORY_CARE_PROVIDER_SITE_OTHER): Payer: Medicare Other

## 2022-12-29 DIAGNOSIS — M1712 Unilateral primary osteoarthritis, left knee: Secondary | ICD-10-CM

## 2022-12-29 MED ORDER — OXYCODONE-ACETAMINOPHEN 10-325 MG PO TABS
1.0000 | ORAL_TABLET | Freq: Three times a day (TID) | ORAL | 0 refills | Status: DC | PRN
Start: 2022-12-29 — End: 2023-10-20

## 2022-12-29 NOTE — Progress Notes (Signed)
    Procedures performed today:    Procedure: Real-time Ultrasound Guided injection of the left knee Device: Samsung HS60  Verbal informed consent obtained.  Time-out conducted.  Noted no overlying erythema, induration, or other signs of local infection.  Skin prepped in a sterile fashion.  Local anesthesia: Topical Ethyl chloride.  With sterile technique and under real time ultrasound guidance: Trace effusion noted, 1 cc Kenalog 40, 2 cc lidocaine, 2 cc bupivacaine injected easily Completed without difficulty  Advised to call if fevers/chills, erythema, induration, drainage, or persistent bleeding.  Images permanently stored and available for review in PACS.  Impression: Technically successful ultrasound guided injection.  Independent interpretation of notes and tests performed by another provider:   None.  Brief History, Exam, Impression, and Recommendations:    Primary osteoarthritis of left knee with degenerative meniscal tearing This pleasant 67 year old female returns, she has chronic left knee pain, pain was initially near the pes bursa, we did a pes bursa injection early September, she did not have any relief, ultimately we obtained an MRI that did show some degenerative meniscal tearing and significant osteoarthritis. Due to these findings we will place another injection but this time into the joint space. Adding a bit of Percocet as she is having significant pain now and does not respond well to tramadol or hydrocodone.    ____________________________________________ Ihor Austin. Benjamin Stain, M.D., ABFM., CAQSM., AME. Primary Care and Sports Medicine [facility]  Adjunct Professor of Family Medicine  [employer] [employer] [facility] [employer]  Restaurant manager, fast food

## 2022-12-29 NOTE — Assessment & Plan Note (Addendum)
This pleasant 67 year old female returns, she has chronic left knee pain, pain was initially near the pes bursa, we did a pes bursa injection early September, she did not have any relief, ultimately we obtained an MRI that did show some degenerative meniscal tearing and significant osteoarthritis. Due to these findings we will place another injection but this time into the joint space. Adding a bit of Percocet as she is having significant pain now and does not respond well to tramadol or hydrocodone.

## 2022-12-30 DIAGNOSIS — M1712 Unilateral primary osteoarthritis, left knee: Secondary | ICD-10-CM | POA: Diagnosis not present

## 2022-12-30 MED ORDER — TRIAMCINOLONE ACETONIDE 40 MG/ML IJ SUSP
40.0000 mg | Freq: Once | INTRAMUSCULAR | Status: AC
Start: 2022-12-30 — End: 2022-12-30
  Administered 2022-12-30: 40 mg via INTRAMUSCULAR

## 2022-12-30 NOTE — Addendum Note (Signed)
Addended by: Carren Rang A on: 12/30/2022 10:38 AM   Modules accepted: Orders

## 2022-12-30 NOTE — Discharge Instructions (Signed)

## 2022-12-31 ENCOUNTER — Inpatient Hospital Stay
Admission: RE | Admit: 2022-12-31 | Discharge: 2022-12-31 | Disposition: A | Discharge status: Home / Self Care | Payer: Medicare Other | Source: Ambulatory Visit | Attending: Sports Medicine | Admitting: Sports Medicine

## 2023-01-12 ENCOUNTER — Encounter: Payer: Self-pay | Admitting: Podiatry

## 2023-01-12 NOTE — Discharge Instructions (Signed)

## 2023-01-13 ENCOUNTER — Ambulatory Visit
Admission: RE | Admit: 2023-01-13 | Discharge: 2023-01-13 | Disposition: A | Discharge status: Home / Self Care | Payer: Medicare Other | Source: Ambulatory Visit | Attending: Sports Medicine | Admitting: Sports Medicine

## 2023-01-13 DIAGNOSIS — M48061 Spinal stenosis, lumbar region without neurogenic claudication: Secondary | ICD-10-CM | POA: Diagnosis not present

## 2023-01-13 DIAGNOSIS — M5136 Other intervertebral disc degeneration, lumbar region with discogenic back pain only: Secondary | ICD-10-CM | POA: Diagnosis not present

## 2023-01-13 DIAGNOSIS — M47816 Spondylosis without myelopathy or radiculopathy, lumbar region: Secondary | ICD-10-CM | POA: Diagnosis not present

## 2023-01-13 MED ORDER — IOPAMIDOL (ISOVUE-M 200) INJECTION 41%
1.0000 mL | Freq: Once | INTRAMUSCULAR | Status: AC
  Administered 2023-01-13: 1 mL via EPIDURAL

## 2023-01-13 MED ORDER — METHYLPREDNISOLONE ACETATE 40 MG/ML INJ SUSP (RADIOLOG
80.0000 mg | Freq: Once | INTRAMUSCULAR | Status: AC
  Administered 2023-01-13: 80 mg via EPIDURAL

## 2023-01-18 ENCOUNTER — Ambulatory Visit: Payer: Medicare Other | Admitting: Family Medicine

## 2023-01-25 NOTE — Progress Notes (Signed)
error 

## 2023-02-09 ENCOUNTER — Ambulatory Visit (INDEPENDENT_AMBULATORY_CARE_PROVIDER_SITE_OTHER): Payer: Medicare Other | Admitting: Sports Medicine

## 2023-02-09 ENCOUNTER — Encounter: Payer: Self-pay | Admitting: Sports Medicine

## 2023-02-09 DIAGNOSIS — M1712 Unilateral primary osteoarthritis, left knee: Secondary | ICD-10-CM

## 2023-02-09 DIAGNOSIS — M48061 Spinal stenosis, lumbar region without neurogenic claudication: Secondary | ICD-10-CM

## 2023-02-09 NOTE — Progress Notes (Signed)
    Procedures performed today:    None.  Independent interpretation of notes and tests performed by another provider:   None.  Brief History, Exam, Impression, and Recommendations:    Lumbar spinal stenosis This is an exquisitely pleasant 67 year old female with history of persistent and severe axial back pain worse with standing, after failure of conservative treatment and the increase of amitriptyline to 50 mg, steroids and tramadol we proceeded with an MRI, the MRI did show multilevel DDD, the worst level was L5-S1, she did get an L5-S1 interlaminar epidural and reports essentially complete relief of her discomfort. I gave her some anticipatory guidance and she plans to continue her home physical therapy, return as needed.  Primary osteoarthritis of left knee with degenerative meniscal tearing MRI confirmed degenerative meniscal tearing and osteoarthritis, responded extremely well to an injection at the last visit, return to see me as needed. She will continue her home conditioning.    ____________________________________________ Ihor Austin. Benjamin Stain, M.D., ABFM., CAQSM., AME. Primary Care and Sports Medicine [facility]  Adjunct Professor of Family Medicine  [place] [employer] [facility] [employer]  Restaurant manager, fast food

## 2023-02-09 NOTE — Assessment & Plan Note (Signed)
This is an exquisitely pleasant 67 year old female with history of persistent and severe axial back pain worse with standing, after failure of conservative treatment and the increase of amitriptyline to 50 mg, steroids and tramadol we proceeded with an MRI, the MRI did show multilevel DDD, the worst level was L5-S1, she did get an L5-S1 interlaminar epidural and reports essentially complete relief of her discomfort. I gave her some anticipatory guidance and she plans to continue her home physical therapy, return as needed.

## 2023-02-09 NOTE — Assessment & Plan Note (Signed)
MRI confirmed degenerative meniscal tearing and osteoarthritis, responded extremely well to an injection at the last visit, return to see me as needed. She will continue her home conditioning.

## 2023-02-16 ENCOUNTER — Encounter (INDEPENDENT_AMBULATORY_CARE_PROVIDER_SITE_OTHER): Payer: Medicare Other | Admitting: Family Medicine

## 2023-02-16 DIAGNOSIS — J0100 Acute maxillary sinusitis, unspecified: Secondary | ICD-10-CM

## 2023-02-17 ENCOUNTER — Other Ambulatory Visit: Payer: Self-pay | Admitting: Sports Medicine

## 2023-02-17 DIAGNOSIS — M48061 Spinal stenosis, lumbar region without neurogenic claudication: Secondary | ICD-10-CM

## 2023-02-17 DIAGNOSIS — J0100 Acute maxillary sinusitis, unspecified: Secondary | ICD-10-CM

## 2023-02-17 MED ORDER — PREDNISONE 20 MG PO TABS: 40.0000 mg | ORAL_TABLET | Freq: Every day | ORAL | 0 refills | Status: AC

## 2023-02-17 NOTE — Telephone Encounter (Signed)
Please see the MyChart message reply(ies) for my assessment and plan.  The patient gave consent for this Medical Advice Message and is aware that it may result in a bill to their insurance company as well as the possibility that this may result in a co-payment or deductible. They are an established patient, but are not seeking medical advice exclusively about a problem treated during an in person or video visit in the last 7 days. I did not recommend an in person or video visit within 7 days of my reply.  I spent a total of 6 minutes cumulative time within 7 days through MyChart messaging Karon Heckendorn Paul Hairo Garraway, DO  

## 2023-04-21 ENCOUNTER — Ambulatory Visit: Payer: Medicare Other | Admitting: Family Medicine

## 2023-04-21 ENCOUNTER — Ambulatory Visit: Payer: Medicare Other

## 2023-04-21 ENCOUNTER — Encounter: Payer: Self-pay | Admitting: Family Medicine

## 2023-04-21 VITALS — BP 126/78 | HR 91 | Temp 98.0°F | Resp 16 | Ht 66.0 in | Wt 186.0 lb

## 2023-04-21 DIAGNOSIS — M79641 Pain in right hand: Secondary | ICD-10-CM

## 2023-04-21 DIAGNOSIS — E785 Hyperlipidemia, unspecified: Secondary | ICD-10-CM

## 2023-04-21 DIAGNOSIS — E039 Hypothyroidism, unspecified: Secondary | ICD-10-CM | POA: Diagnosis not present

## 2023-04-21 DIAGNOSIS — M19041 Primary osteoarthritis, right hand: Secondary | ICD-10-CM | POA: Diagnosis not present

## 2023-04-21 LAB — COMPREHENSIVE METABOLIC PANEL
ALT: 23 U/L (ref 0–35)
AST: 23 U/L (ref 0–37)
Albumin: 4.6 g/dL (ref 3.5–5.2)
Alkaline Phosphatase: 59 U/L (ref 39–117)
BUN: 16 mg/dL (ref 6–23)
CO2: 26 meq/L (ref 19–32)
Calcium: 9.2 mg/dL (ref 8.4–10.5)
Chloride: 107 meq/L (ref 96–112)
Creatinine, Ser: 0.96 mg/dL (ref 0.40–1.20)
GFR: 60.78 mL/min (ref >60.00)
Glucose, Bld: 98 mg/dL (ref 70–99)
Potassium: 4.2 meq/L (ref 3.5–5.1)
Sodium: 140 meq/L (ref 135–145)
Total Bilirubin: 0.4 mg/dL (ref 0.2–1.2)
Total Protein: 6.8 g/dL (ref 6.0–8.3)

## 2023-04-21 LAB — LIPID PANEL
Cholesterol: 194 mg/dL (ref 0–200)
HDL: 45.6 mg/dL (ref >39.00)
LDL Cholesterol: 122 mg/dL — ABNORMAL HIGH (ref 0–99)
NonHDL: 148.05
Total CHOL/HDL Ratio: 4
Triglycerides: 129 mg/dL (ref 0.0–149.0)
VLDL: 25.8 mg/dL (ref 0.0–40.0)

## 2023-04-21 LAB — T4, FREE: Free T4: 1 ng/dL (ref 0.60–1.60)

## 2023-04-21 LAB — TSH: TSH: 0.04 u[IU]/mL — ABNORMAL LOW (ref 0.35–5.50)

## 2023-04-21 NOTE — Patient Instructions (Addendum)
Give [place] 2-3 business days to get the results of your labs back.   Keep the diet clean and stay active.  If you ever wanted a 3rd opinion with ortho foot/ankle, let me know.   Please get your X-ray done at the [facility] in [place]: [address], [place], [place] [address] [phone]  You do not need an appointment for this location.   Ice/cold pack over area for 10-15 min twice daily.  OK to take Tylenol 1000 mg (2 extra strength tabs) or 975 mg (3 regular strength tabs) every 6 hours as needed.  Let us know if you need anything.  Hand Exercises Hand exercises can be helpful for almost anyone. These exercises can strengthen the hands, improve flexibility and movement, and increase blood flow to the hands. These results can make work and daily tasks easier. Hand exercises can be especially helpful for people who have joint pain from arthritis or have nerve damage from overuse (carpal tunnel syndrome). These exercises can also help people who have injured a hand. Exercises Most of these hand exercises are gentle stretching and motion exercises. It is usually safe to do them often throughout the day. Warming up your hands before exercise may help to reduce stiffness. You can do this with gentle massage or by placing your hands in warm water for 10-15 minutes. It is normal to feel some stretching, pulling, tightness, or mild discomfort as you begin new exercises. This will gradually improve. Stop an exercise right away if you feel sudden, severe pain or your pain gets worse. Ask your health care provider which exercises are best for you. Knuckle bend or "claw" fist Stand or sit with your arm, hand, and all five fingers pointed straight up. Make sure to keep your wrist straight during the exercise. Gently bend your fingers down toward your palm until the tips of your fingers are touching the top of your palm. Keep your big knuckle straight and just bend the small knuckles in your  fingers. Hold this position for 3 seconds. Straighten (extend) your fingers back to the starting position. Repeat this exercise 5-10 times with each hand. Full finger fist Stand or sit with your arm, hand, and all five fingers pointed straight up. Make sure to keep your wrist straight during the exercise. Gently bend your fingers into your palm until the tips of your fingers are touching the middle of your palm. Hold this position for 3 seconds. Extend your fingers back to the starting position, stretching every joint fully. Repeat this exercise 5-10 times with each hand. Straight fist Stand or sit with your arm, hand, and all five fingers pointed straight up. Make sure to keep your wrist straight during the exercise. Gently bend your fingers at the big knuckle, where your fingers meet your hand, and the middle knuckle. Keep the knuckle at the tips of your fingers straight and try to touch the bottom of your palm. Hold this position for 3 seconds. Extend your fingers back to the starting position, stretching every joint fully. Repeat this exercise 5-10 times with each hand. Tabletop Stand or sit with your arm, hand, and all five fingers pointed straight up. Make sure to keep your wrist straight during the exercise. Gently bend your fingers at the big knuckle, where your fingers meet your hand, as far down as you can while keeping the small knuckles in your fingers straight. Think of forming a tabletop with your fingers. Hold this position for 3 seconds. Extend  your fingers back to the starting position, stretching every joint fully. Repeat this exercise 5-10 times with each hand. Finger spread Place your hand flat on a table with your palm facing down. Make sure your wrist stays straight as you do this exercise. Spread your fingers and thumb apart from each other as far as you can until you feel a gentle stretch. Hold this position for 3 seconds. Bring your fingers and thumb tight together  again. Hold this position for 3 seconds. Repeat this exercise 5-10 times with each hand. Making circles Stand or sit with your arm, hand, and all five fingers pointed straight up. Make sure to keep your wrist straight during the exercise. Make a circle by touching the tip of your thumb to the tip of your index finger. Hold for 3 seconds. Then open your hand wide. Repeat this motion with your thumb and each finger on your hand. Repeat this exercise 5-10 times with each hand. Thumb motion Sit with your forearm resting on a table and your wrist straight. Your thumb should be facing up toward the ceiling. Keep your fingers relaxed as you move your thumb. Lift your thumb up as high as you can toward the ceiling. Hold for 3 seconds. Bend your thumb across your palm as far as you can, reaching the tip of your thumb for the small finger (pinkie) side of your palm. Hold for 3 seconds. Repeat this exercise 5-10 times with each hand. Grip strengthening  Hold a stress ball or other soft ball in the middle of your hand. Slowly increase the pressure, squeezing the ball as much as you can without causing pain. Think of bringing the tips of your fingers into the middle of your palm. All of your finger joints should bend when doing this exercise. Hold your squeeze for 3 seconds, then relax. Repeat this exercise 5-10 times with each hand. Contact a health care provider if: Your hand pain or discomfort gets much worse when you do an exercise. Your hand pain or discomfort does not improve within 2 hours after you exercise. If you have any of these problems, stop doing these exercises right away. Do not do them again unless your health care provider says that you can. Get help right away if: You develop sudden, severe hand pain or swelling. If this happens, stop doing these exercises right away. Do not do them again unless your health care provider says that you can. Make sure you discuss any questions you have  with your health care provider. Document Revised: 06/30/2018 Document Reviewed: 03/10/2018 Elsevier Patient Education  2020 [employer].

## 2023-04-21 NOTE — Progress Notes (Signed)
Chief Complaint  Patient presents with   Follow-up    Follow up    Subjective: Hyperlipidemia Patient presents for Hyperlipidemia follow up. Currently taking lovastatin 10 mg/d and compliance with treatment thus far has been good. She denies myalgias. She is adhering to a healthy diet. Exercise: active around house. No CP or SOB.  The patient is not known to have coexisting coronary artery disease.  Hypothyroidism Patient presents for follow-up of hypothyroidism.  Reports compliance with medication-levothyroxine 125 mcg daily. Current symptoms include: denies fatigue, weight changes, heat/cold intolerance, bowel/skin changes or CVS symptoms She believes her dose should be not significantly changed  Since Christmas, the patient has had pain over her right hand near the base of the thumb.  No obvious injury or change activity.  Slight swelling.  No redness, bruising, decreased range of motion.  She has not tried anything at home so far.  No pain over the joints.  No neurologic signs or symptoms.  Past Medical History:  Diagnosis Date   Anxiety    Follows w/ PCP, Dr. Arva Chafe.   Arthritis    left toes   Cancer (HCC)    breast (Left)   Complex regional pain syndrome of left lower extremity    type 1, follows with Dr. Metro Kung @ [facility], LOV 07/17/21   Dry eye syndrome    History of colon polyps    History of lobular carcinoma in situ (LCIS) of  left breast    s/p lumpectomy in 2005, Tamoxifen/Evista therapy for 5 years   Hyperlipidemia    Follows w/ PCP, Dr. Arva Chafe.   Hypothyroidism    Follows w/ PCP, Dr. Arva Chafe.   Leg cramping 09/2021   taking muscle relaxants at night   Neuromuscular disorder (HCC)    small fiber neuropathy, L foot , follows with Dr. Cannon Kettle @ [facility].   Sacroiliac joint dysfunction of right side    Thyroid disease    Wears glasses    Wears hearing aid    Wears  partial dentures    upper and lower    Objective: BP 126/78   Pulse 91   Temp 98 F (36.7 C) (Oral)   Resp 16   Ht 5\' 6"  (1.676 m)   Wt 186 lb (84.4 kg)   SpO2 95%   BMI 30.02 kg/m  General: Awake, appears stated age HEENT: MMM Heart: RRR, no LE edema, no bruits Lungs: CTAB, no rales, wheezes or rhonchi. No accessory muscle use MSK: TTP over the first metacarpal and surrounding soft tissue.  Slight edema.  No excessive warmth, erythema, drainage, fluctuance.  There is no pain with axial loading of the bone.  No deformity. Psych: Age appropriate judgment and insight, normal affect and mood  Assessment and Plan: Hyperlipidemia, unspecified hyperlipidemia type - Plan: Comprehensive metabolic panel, Lipid panel  Hypothyroidism, unspecified type - Plan: TSH, T4, free  Hand pain, right - Plan: DG Hand Complete Right  Chronic, stable.  Counseled on diet and exercise.  Check labs.  Continue Mevacor 10 mg daily. Chronic, stable.  Check labs.  Continues levothyroxine 125 mcg daily. Stretches and exercises for the hand provided.  Heat, ice, Tylenol.  Check x-ray to rule out bony involvement.  Send message in 1 month if no improvement. F/u 6 months. The patient voiced understanding and agreement to the plan.  Jilda Roche [place], DO 04/21/23  10:26 AM

## 2023-04-30 ENCOUNTER — Ambulatory Visit (INDEPENDENT_AMBULATORY_CARE_PROVIDER_SITE_OTHER): Payer: Medicare Other

## 2023-04-30 ENCOUNTER — Ambulatory Visit: Payer: Medicare Other | Admitting: Podiatry

## 2023-04-30 DIAGNOSIS — Z9889 Other specified postprocedural states: Secondary | ICD-10-CM

## 2023-04-30 DIAGNOSIS — M2022 Hallux rigidus, left foot: Secondary | ICD-10-CM

## 2023-04-30 DIAGNOSIS — R609 Edema, unspecified: Secondary | ICD-10-CM

## 2023-04-30 NOTE — Progress Notes (Addendum)
 Subjective: Chief Complaint  Patient presents with   Routine Post Op    RM#11 POV#8 patient states has a concern to discuss with the doctor. States doing well no pain at this time.   68 year old female presents the office with above concerns.  She states that she has noticed her toes started to drift up a little bit she has noticed a spot on her big toe,.  It is hard.  Otherwise she states that she is feeling well she is not having any pain.  No injuries that she reports.  She is wearing her regular shoes.  No other concerns.  Objective: AAO x3, NAD DP/PT pulses palpable bilaterally, CRT less than 3 seconds Incision from surgery well coapted without any evidence of dehiscence scar is well-formed.  There is no erythema or warmth.  The area of concern is a nodule on the proximal lateral portion of the hallux as pictured below but there is a small area of swelling.  There is extracardiac nature.  There is no drainage or pus or any fluctuation or potation.  There is no pain on exam. No pain with calf compression, swelling, warmth, erythema     Assessment: Left foot status post hardware removal, previous first MPJ arthrodesis  Plan: -All treatment options discussed with the patient including all alternatives, risks, complications.  -X-rays obtained reviewed.  Multiple views were obtained.  There is lucency along the area of the graft.  There is no evidence of acute fracture otherwise. -Clinically does not appear to have any signs of infection or things coming from resorption of the graft.  However he should have CBC, sed rate, CRP to check for infection markers.  The area on the hallux is firm that he is coming from bone, graft resorption. -I would like to add the graft site plate on the area of the hallux on her insert.  Will have her follow-up with Sueanne, pedorthist for modification of her insert. -Patient encouraged to call the office with any questions, concerns, change in symptoms.    Elaine Buck DPM

## 2023-05-01 ENCOUNTER — Other Ambulatory Visit: Payer: Self-pay | Admitting: Family Medicine

## 2023-05-01 ENCOUNTER — Encounter: Payer: Self-pay | Admitting: Family Medicine

## 2023-05-01 DIAGNOSIS — M79641 Pain in right hand: Secondary | ICD-10-CM

## 2023-05-01 LAB — CBC WITH DIFFERENTIAL/PLATELET
Basophils Absolute: 0 10*3/uL (ref 0.0–0.2)
Basos: 1 %
EOS (ABSOLUTE): 0.1 10*3/uL (ref 0.0–0.4)
Eos: 1 %
Hematocrit: 42.5 % (ref 34.0–46.6)
Hemoglobin: 13.7 g/dL (ref 11.1–15.9)
Immature Grans (Abs): 0 10*3/uL (ref 0.0–0.1)
Immature Granulocytes: 0 %
Lymphocytes Absolute: 2.1 10*3/uL (ref 0.7–3.1)
Lymphs: 38 %
MCH: 28.2 pg (ref 26.6–33.0)
MCHC: 32.2 g/dL (ref 31.5–35.7)
MCV: 87 fL (ref 79–97)
Monocytes Absolute: 0.5 10*3/uL (ref 0.1–0.9)
Monocytes: 9 %
Neutrophils Absolute: 2.8 10*3/uL (ref 1.4–7.0)
Neutrophils: 51 %
Platelets: 234 10*3/uL (ref 150–450)
RBC: 4.86 x10E6/uL (ref 3.77–5.28)
RDW: 11.8 % (ref 11.7–15.4)
WBC: 5.6 10*3/uL (ref 3.4–10.8)

## 2023-05-01 LAB — SEDIMENTATION RATE: Sed Rate: 11 mm/h (ref 0–40)

## 2023-05-01 LAB — C-REACTIVE PROTEIN: CRP: <1 mg/L (ref 0–10)

## 2023-05-02 ENCOUNTER — Encounter: Payer: Self-pay | Admitting: Podiatry

## 2023-05-10 ENCOUNTER — Ambulatory Visit: Payer: Medicare Other

## 2023-05-10 DIAGNOSIS — M19041 Primary osteoarthritis, right hand: Secondary | ICD-10-CM | POA: Diagnosis not present

## 2023-05-10 DIAGNOSIS — M1811 Unilateral primary osteoarthritis of first carpometacarpal joint, right hand: Secondary | ICD-10-CM | POA: Diagnosis not present

## 2023-05-10 DIAGNOSIS — M79641 Pain in right hand: Secondary | ICD-10-CM

## 2023-05-10 MED ORDER — GADOBUTROL 1 MMOL/ML IV SOLN
8.5000 mL | Freq: Once | INTRAVENOUS | Status: AC | PRN
  Administered 2023-05-10: 8.5 mL via INTRAVENOUS

## 2023-05-18 ENCOUNTER — Encounter: Payer: Self-pay | Admitting: Family Medicine

## 2023-06-02 ENCOUNTER — Ambulatory Visit: Payer: Medicare Other

## 2023-06-02 NOTE — Progress Notes (Signed)
 Patient was present for Carbon Morton's extension to be added to Left 1st MTPJ I would have to send back to [facility]  As orthotic has a soft Morton's on it already have them remove and add a rigid so I took Carbon Foot plate and ground down here in office to make Morton's Plate that patient can wear under orthotic providing greater support and increasing immobility of 1st MTPJ  Patient will wear, noted to remove if it bothers her and I will then send orthotic back to [facility] for modification

## 2023-06-25 ENCOUNTER — Other Ambulatory Visit: Payer: Self-pay | Admitting: Family Medicine

## 2023-06-25 DIAGNOSIS — E785 Hyperlipidemia, unspecified: Secondary | ICD-10-CM

## 2023-07-16 ENCOUNTER — Other Ambulatory Visit: Payer: Self-pay | Admitting: Family Medicine

## 2023-07-22 ENCOUNTER — Other Ambulatory Visit: Payer: Self-pay | Admitting: Family Medicine

## 2023-07-22 DIAGNOSIS — E039 Hypothyroidism, unspecified: Secondary | ICD-10-CM

## 2023-08-11 ENCOUNTER — Other Ambulatory Visit: Payer: Self-pay | Admitting: Family Medicine

## 2023-08-26 ENCOUNTER — Other Ambulatory Visit: Payer: Self-pay | Admitting: Family Medicine

## 2023-08-26 DIAGNOSIS — F419 Anxiety disorder, unspecified: Secondary | ICD-10-CM

## 2023-09-08 ENCOUNTER — Other Ambulatory Visit: Payer: Self-pay | Admitting: Family Medicine

## 2023-10-20 ENCOUNTER — Ambulatory Visit: Payer: Self-pay | Admitting: Family Medicine

## 2023-10-20 ENCOUNTER — Ambulatory Visit (INDEPENDENT_AMBULATORY_CARE_PROVIDER_SITE_OTHER): Payer: Medicare Other | Admitting: Family Medicine

## 2023-10-20 ENCOUNTER — Encounter: Payer: Self-pay | Admitting: Family Medicine

## 2023-10-20 VITALS — BP 110/72 | HR 94 | Temp 98.0°F | Resp 16 | Ht 66.0 in | Wt 179.0 lb

## 2023-10-20 DIAGNOSIS — E039 Hypothyroidism, unspecified: Secondary | ICD-10-CM

## 2023-10-20 DIAGNOSIS — E785 Hyperlipidemia, unspecified: Secondary | ICD-10-CM

## 2023-10-20 DIAGNOSIS — Z0000 Encounter for general adult medical examination without abnormal findings: Secondary | ICD-10-CM | POA: Diagnosis not present

## 2023-10-20 LAB — CBC
HCT: 43.3 % (ref 36.0–46.0)
Hemoglobin: 14.2 g/dL (ref 12.0–15.0)
MCHC: 32.7 g/dL (ref 30.0–36.0)
MCV: 85 fl (ref 78.0–100.0)
Platelets: 238 K/uL (ref 150.0–400.0)
RBC: 5.1 Mil/uL (ref 3.87–5.11)
RDW: 13.2 % (ref 11.5–15.5)
WBC: 5.6 K/uL (ref 4.0–10.5)

## 2023-10-20 LAB — COMPREHENSIVE METABOLIC PANEL WITH GFR
ALT: 19 U/L (ref 0–35)
AST: 22 U/L (ref 0–37)
Albumin: 4.6 g/dL (ref 3.5–5.2)
Alkaline Phosphatase: 67 U/L (ref 39–117)
BUN: 17 mg/dL (ref 6–23)
CO2: 26 meq/L (ref 19–32)
Calcium: 9.1 mg/dL (ref 8.4–10.5)
Chloride: 107 meq/L (ref 96–112)
Creatinine, Ser: 0.85 mg/dL (ref 0.40–1.20)
GFR: 70.1 mL/min (ref >60.00)
Glucose, Bld: 106 mg/dL — ABNORMAL HIGH (ref 70–99)
Potassium: 4.3 meq/L (ref 3.5–5.1)
Sodium: 141 meq/L (ref 135–145)
Total Bilirubin: 0.5 mg/dL (ref 0.2–1.2)
Total Protein: 6.8 g/dL (ref 6.0–8.3)

## 2023-10-20 LAB — LIPID PANEL
Cholesterol: 199 mg/dL (ref 0–200)
HDL: 46.2 mg/dL (ref >39.00)
LDL Cholesterol: 125 mg/dL — ABNORMAL HIGH (ref 0–99)
NonHDL: 152.57
Total CHOL/HDL Ratio: 4
Triglycerides: 139 mg/dL (ref 0.0–149.0)
VLDL: 27.8 mg/dL (ref 0.0–40.0)

## 2023-10-20 LAB — T4, FREE: Free T4: 1.03 ng/dL (ref 0.60–1.60)

## 2023-10-20 LAB — TSH: TSH: 0.05 u[IU]/mL — ABNORMAL LOW (ref 0.35–5.50)

## 2023-10-20 NOTE — Progress Notes (Signed)
 Chief Complaint  Patient presents with   Annual Exam    CPE     Well Woman Elaine Buck is here for a complete physical.   Her last physical was >1 year ago.  Current diet: in general, a healthy diet. Current exercise: swimming, walking. Weight is slightly decreasing and she denies daytime fatigue. Seatbelt? Yes Advanced directive? Yes  Health Maintenance Colonoscopy- Yes Shingrix - Yes DEXA- Yes Mammogram- Yes Tetanus- Yes Pneumonia- Yes Hep C screen- Yes  Past Medical History:  Diagnosis Date   Anxiety    Follows w/ PCP, Dr. Mabel Pry.   Arthritis    left toes   Cancer (HCC)    breast (Left)   Complex regional pain syndrome of left lower extremity    type 1, follows with Dr. Lonni Humphreys @ [facility], LOV 07/17/21   Dry eye syndrome    History of colon polyps    History of lobular carcinoma in situ (LCIS) of  left breast    s/p lumpectomy in 2005, Tamoxifen/Evista therapy for 5 years   Hyperlipidemia    Follows w/ PCP, Dr. Mabel Pry.   Hypothyroidism    Follows w/ PCP, Dr. Mabel Pry.   Leg cramping 09/2021   taking muscle relaxants at night   Neuromuscular disorder (HCC)    small fiber neuropathy, L foot , follows with Dr. Lonni Charleston @ [facility].   Sacroiliac joint dysfunction of right side    Thyroid  disease    Wears glasses    Wears hearing aid    Wears partial dentures    upper and lower     Past Surgical History:  Procedure Laterality Date   BREAST BIOPSY  2005   BREAST LUMPECTOMY Left 05/22/2003   LCIS Cells found in breast tissue   CARPAL TUNNEL RELEASE Bilateral    around 2003   COLONOSCOPY     04/15/2016, 11/24/2019, 01/25/2020, hx of multiple colon polyps   FOOT SURGERY Left 04/2019   joint inplantment in big toe    FOOT SURGERY Left 12/17/2021   left 1st MPJ fusion revision, 2nd hammer toe repair   GRAFT APPLICATION Left 03/19/2022   Procedure: GRAFT APPLICATION;   Surgeon: Gershon Donnice SAUNDERS, DPM;  Location: [facility] [place];  Service: Podiatry;  Laterality: Left;   JOINT REPLACEMENT  February 2021   Left big toe   TUBAL LIGATION  1986   WOUND DEBRIDEMENT Left 03/19/2022   Procedure: DEBRIDEMENT WOUND/ULCER;  Surgeon: Gershon Donnice SAUNDERS, DPM;  Location: [facility] ;  Service: Podiatry;  Laterality: Left;    Medications  Current Outpatient Medications on File Prior to Visit  Medication Sig Dispense Refill   amitriptyline  (ELAVIL ) 50 MG tablet TAKE 1/2 TABLET BY MOUTH EVERY NIGHT AT BEDTIME FOR ONE WEEK, THEN TAKE 1 TABLET BY MOUTH EVERY NIGHT AT BEDTIME 30 tablet 3   b complex vitamins capsule Take 1 capsule by mouth daily.     Calcium  Carbonate-Vitamin D 600-400 MG-UNIT tablet Take by mouth.     Cholecalciferol (VITAMIN D3 PO) Take 1 tablet by mouth daily.     levothyroxine  (SYNTHROID ) 125 MCG tablet TAKE 1 TABLET BY MOUTH DAILY 100 tablet 2   lovastatin  (MEVACOR ) 10 MG tablet TAKE 1 TABLET BY MOUTH AT  BEDTIME 100 tablet 2   Multiple Vitamin (MULTIVITAMIN) tablet Take 1 tablet by mouth daily.     Omega-3 Fatty Acids (FISH OIL PO) Take 1 tablet by mouth daily.     topiramate  (  TOPAMAX ) 25 MG tablet TAKE 2 TABLETS BY MOUTH AT  BEDTIME 200 tablet 2   venlafaxine  XR (EFFEXOR -XR) 150 MG 24 hr capsule TAKE 1 CAPSULE BY MOUTH DAILY  WITH BREAKFAST 90 capsule 3    Allergies Allergies  Allergen Reactions   Celebrex  [Celecoxib ] Swelling   Other Hives and Itching    Peaches    Review of Systems: Constitutional:  no fevers Eye:  no recent significant change in vision Ears:  No changes in hearing Nose/Mouth/Throat:  no complaints of nasal congestion, no sore throat Cardiovascular: no chest pain Respiratory:  No shortness of breath Gastrointestinal:  No change in bowel habits GU:  Female: negative for dysuria Integumentary:  no abnormal skin lesions reported Neurologic:  no headaches Endocrine:  denies unexplained weight  changes  Exam BP 110/72 (BP Location: Left Arm, Patient Position: Sitting)   Pulse 94   Temp 98 F (36.7 C) (Oral)   Resp 16   Ht 5' 6 (1.676 m)   Wt 179 lb (81.2 kg)   SpO2 95%   BMI 28.89 kg/m  General:  well developed, well nourished, in no apparent distress Skin:  no significant moles, warts, or growths Head:  no masses, lesions, or tenderness Eyes:  pupils equal and round, sclera anicteric without injection Ears:  canals without lesions, TMs shiny without retraction, no obvious effusion, no erythema Nose:  nares patent, mucosa normal, and no drainage Throat/Pharynx:  lips and gingiva without lesion; tongue and uvula midline; non-inflamed pharynx; no exudates or postnasal drainage Neck: neck supple without adenopathy, thyromegaly, or masses Lungs:  clear to auscultation, breath sounds equal bilaterally, no respiratory distress Cardio:  regular rate and rhythm, no bruits or LE edema Abdomen:  abdomen soft, nontender; bowel sounds normal; no masses or organomegaly Genital: Deferred Neuro:  gait normal; deep tendon reflexes normal and symmetric Psych: well oriented with normal range of affect and appropriate judgment/insight  Assessment and Plan  Well adult exam  Hypothyroidism, unspecified type - Plan: TSH, T4, free  Hyperlipidemia, unspecified hyperlipidemia type - Plan: CBC, Comprehensive metabolic panel with GFR, Lipid panel   Well 68 y.o. female. Counseled on diet and exercise. Statin drug holiday for 2 weeks. Send message w update. If no change, will add back and start K2. If better, may still add back and see how she does.  Other orders as above. Follow up 6 mo. The patient voiced understanding and agreement to the plan.  Mabel [place], DO 10/20/23 8:22 AM

## 2023-10-20 NOTE — Patient Instructions (Addendum)
 Give us  2-3 business days to get the results of your labs back.   Stop your statin for the next 2 weeks and then send me a message with an update of how you are doing.   Consider taking 100-200 mcg daily of Vit K2.   Keep the diet clean and stay active.  Let us  know if you need anything.

## 2023-10-29 ENCOUNTER — Ambulatory Visit: Admitting: *Deleted

## 2023-10-29 VITALS — Ht 66.0 in | Wt 179.0 lb

## 2023-10-29 DIAGNOSIS — Z0000 Encounter for general adult medical examination without abnormal findings: Secondary | ICD-10-CM

## 2023-10-29 DIAGNOSIS — M8580 Other specified disorders of bone density and structure, unspecified site: Secondary | ICD-10-CM

## 2023-10-29 DIAGNOSIS — Z1231 Encounter for screening mammogram for malignant neoplasm of breast: Secondary | ICD-10-CM

## 2023-10-29 NOTE — Patient Instructions (Addendum)
 Elaine Buck , Thank you for taking time out of your busy schedule to complete your Annual Wellness Visit with me. I enjoyed our conversation and look forward to speaking with you again next year. I, as well as your care team,  appreciate your ongoing commitment to your health goals. Please review the following plan we discussed and let me know if I can assist you in the future. Your Game plan/ To Do List    Referrals: If you haven't heard from the office you've been referred to, please reach out to them at the phone provided.   Mammogram / Bone Density:  [facility], [phone]  Follow up Visits: Next Medicare AWV with our clinical staff: 11/01/24 11am    Next Office Visit with your provider: 04/25/24 8am  Clinician Recommendations:  Aim for 30 minutes of exercise or brisk walking, 6-8 glasses of water, and 5 servings of fruits and vegetables each day.   You will need to get the following vaccines at your local pharmacy: Flu      This is a list of the screening recommended for you and due dates:  Health Maintenance  Topic Date Due   Medicare Annual Wellness Visit  10/14/2023   Flu Shot  10/22/2023   COVID-19 Vaccine (6 - 2024-25 season) 04/21/2024*   Mammogram  12/23/2024   Colon Cancer Screening  01/24/2025   DTaP/Tdap/Td vaccine (2 - Td or Tdap) 12/29/2026   Pneumococcal Vaccine for age over 40  Completed   DEXA scan (bone density measurement)  Completed   Hepatitis C Screening  Completed   Zoster (Shingles) Vaccine  Completed   Hepatitis B Vaccine  Aged Out   HPV Vaccine  Aged Out   Meningitis B Vaccine  Aged Out  *Topic was postponed. The date shown is not the original due date.    Advanced directives: (ACP Link)Information on Advanced Care Planning can be found at [address]  Secretary of [facility] Advance Health Care Directives Advance Health Care Directives. [email or web address] if you need to make any changes to your current directives.    See attachments for Preventive  Care and Fall Prevention Tips.

## 2023-10-29 NOTE — Progress Notes (Signed)
 Subjective:   Elaine Buck is a 68 y.o. who presents for a Medicare Wellness preventive visit.  As a reminder, Annual Wellness Visits don't include a physical exam, and some assessments may be limited, especially if this visit is performed virtually. We may recommend an in-person follow-up visit with your provider if needed.  Visit Complete: Virtual I connected with  Elaine Buck on 10/29/23 by a audio enabled telemedicine application and verified that I am speaking with the correct person using two identifiers.  Patient Location: Home  Provider Location: Office/Clinic  I discussed the limitations of evaluation and management by telemedicine. The patient expressed understanding and agreed to proceed.  Vital Signs: Because this visit was a virtual/telehealth visit, some criteria may be missing or patient reported. Any vitals not documented were not able to be obtained and vitals that have been documented are patient reported.  VideoDeclined- This patient declined Librarian, academic. Therefore the visit was completed with audio only.  Persons Participating in Visit: Patient.  AWV Questionnaire: Yes: Patient Medicare AWV questionnaire was completed by the patient on 10/22/23; I have confirmed that all information answered by patient is correct and no changes since this date.  Cardiac Risk Factors include: advanced age (>38men, >13 women);dyslipidemia    Objective:    Today's Vitals   10/29/23 1507  Weight: 179 lb (81.2 kg)  Height: 5' 6 (1.676 m)   Body mass index is 28.89 kg/m.     10/29/2023    3:15 PM 10/14/2022   10:28 AM 03/19/2022    9:14 AM 05/19/2021   11:06 AM 04/29/2021   12:49 PM 06/10/2020   10:38 AM 03/11/2020   11:00 AM  Advanced Directives  Does Patient Have a Medical Advance Directive? Yes Yes Yes Yes Yes Yes Yes  Type of Advance Directive Living will Healthcare Power of [place];Living will Healthcare Power of [place];Living  will Healthcare Power of [place];Living will Healthcare Power of [employer] of [place];Living will;Out of facility DNR (pink MOST or yellow form) Healthcare Power of [place];Living will;Out of facility DNR (pink MOST or yellow form)  Does patient want to make changes to medical advance directive?  No - Patient declined No - Patient declined      Copy of Healthcare Power of Attorney in Chart?  Yes - validated most recent copy scanned in chart (See row information) No - copy requested No - copy requested       Current Medications (verified) Outpatient Encounter Medications as of 10/29/2023  Medication Sig   amitriptyline  (ELAVIL ) 50 MG tablet TAKE 1/2 TABLET BY MOUTH EVERY NIGHT AT BEDTIME FOR ONE WEEK, THEN TAKE 1 TABLET BY MOUTH EVERY NIGHT AT BEDTIME   b complex vitamins capsule Take 1 capsule by mouth daily.   Calcium  Carbonate-Vitamin D 600-400 MG-UNIT tablet Take by mouth.   Cholecalciferol (VITAMIN D3 PO) Take 1 tablet by mouth daily.   levothyroxine  (SYNTHROID ) 125 MCG tablet TAKE 1 TABLET BY MOUTH DAILY   lovastatin  (MEVACOR ) 10 MG tablet TAKE 1 TABLET BY MOUTH AT  BEDTIME   Multiple Vitamin (MULTIVITAMIN) tablet Take 1 tablet by mouth daily.   Omega-3 Fatty Acids (FISH OIL PO) Take 1 tablet by mouth daily.   topiramate  (TOPAMAX ) 25 MG tablet TAKE 2 TABLETS BY MOUTH AT  BEDTIME   venlafaxine  XR (EFFEXOR -XR) 150 MG 24 hr capsule TAKE 1 CAPSULE BY MOUTH DAILY  WITH BREAKFAST   No facility-administered encounter medications on file as of 10/29/2023.    Allergies (  verified) Celebrex  [celecoxib ] and Other   History: Past Medical History:  Diagnosis Date   Anxiety    Follows w/ PCP, Dr. Mabel Pry.   Arthritis    left toes   Cancer (HCC)    breast (Left)   Complex regional pain syndrome of left lower extremity    type 1, follows with Dr. Lonni Humphreys @ [facility], LOV 07/17/21   Dry eye syndrome    History of colon polyps    History of  lobular carcinoma in situ (LCIS) of  left breast    s/p lumpectomy in 2005, Tamoxifen/Evista therapy for 5 years   Hyperlipidemia    Follows w/ PCP, Dr. Mabel Pry.   Hypothyroidism    Follows w/ PCP, Dr. Mabel Pry.   Leg cramping 09/2021   taking muscle relaxants at night   Neuromuscular disorder (HCC)    small fiber neuropathy, L foot , follows with Dr. Lonni Charleston @ [facility].   Sacroiliac joint dysfunction of right side    Thyroid  disease    Wears glasses    Wears hearing aid    Wears partial dentures    upper and lower   Past Surgical History:  Procedure Laterality Date   BREAST BIOPSY  2005   BREAST LUMPECTOMY Left 05/22/2003   LCIS Cells found in breast tissue   CARPAL TUNNEL RELEASE Bilateral    around 2003   COLONOSCOPY     04/15/2016, 11/24/2019, 01/25/2020, hx of multiple colon polyps   FOOT SURGERY Left 04/2019   joint inplantment in big toe    FOOT SURGERY Left 12/17/2021   left 1st MPJ fusion revision, 2nd hammer toe repair   GRAFT APPLICATION Left 03/19/2022   Procedure: GRAFT APPLICATION;  Surgeon: Gershon Donnice SAUNDERS, DPM;  Location: [facility];  Service: Podiatry;  Laterality: Left;   JOINT REPLACEMENT  February 2021   Left big toe   TUBAL LIGATION  1986   WOUND DEBRIDEMENT Left 03/19/2022   Procedure: DEBRIDEMENT WOUND/ULCER;  Surgeon: Gershon Donnice SAUNDERS, DPM;  Location: [facility];  Service: Podiatry;  Laterality: Left;   Family History  Problem Relation Age of Onset   Stroke Mother    Bipolar disorder Son    ADD / ADHD Son    Anxiety disorder Son    Depression Son    Colon cancer Neg Hx    Esophageal cancer Neg Hx    Stomach cancer Neg Hx    Rectal cancer Neg Hx    Social History   Socioeconomic History   Marital status: Married    Spouse name: Not on file   Number of children: 2   Years of education: Not on file   Highest education level: Some college, no degree   Occupational History   Occupation: retired  Tobacco Use   Smoking status: Every Day    Current packs/day: 0.80    Average packs/day: 0.8 packs/day for 20.0 years (16.0 ttl pk-yrs)    Types: Cigarettes   Smokeless tobacco: Never   Tobacco comments:    Declines quitting for now  Vaping Use   Vaping status: Never Used  Substance and Sexual Activity   Alcohol use: Yes    Comment: 2-3 glasses every other month or less of wine. socially   Drug use: No   Sexual activity: Yes    Partners: Male    Birth control/protection: Post-menopausal  Other Topics Concern   Not on file  Social History Narrative  Left Handed   Lives in a one story home   Drinks one cup of coffee in the morning and Decaf in the evening    Social Drivers of Health   Financial Resource Strain: Low Risk  (10/29/2023)   Overall Financial Resource Strain (CARDIA)    Difficulty of Paying Living Expenses: Not hard at all  Food Insecurity: No Food Insecurity (10/29/2023)   Hunger Vital Sign    Worried About Running Out of Food in the Last Year: Never true    Ran Out of Food in the Last Year: Never true  Transportation Needs: No Transportation Needs (10/29/2023)   PRAPARE - Administrator, Civil Service (Medical): No    Lack of Transportation (Non-Medical): No  Physical Activity: Insufficiently Active (10/29/2023)   Exercise Vital Sign    Days of Exercise per Week: 2 days    Minutes of Exercise per Session: 20 min  Stress: No Stress Concern Present (10/29/2023)   [employer] of Occupational Health - Occupational Stress Questionnaire    Feeling of Stress: Not at all  Social Connections: Socially Isolated (10/29/2023)   Social Connection and Isolation Panel    Frequency of Communication with Friends and Family: Once a week    Frequency of Social Gatherings with Friends and Family: Once a week    Attends Religious Services: Never    Database administrator or Organizations: No    Attends Hospital doctor: Never    Marital Status: Married    Tobacco Counseling Ready to quit: Not Answered Counseling given: Not Answered Tobacco comments: Declines quitting for now    Clinical Intake:  Pre-visit preparation completed: Yes  Pain : No/denies pain     BMI - recorded: 28.89 Nutritional Status: BMI 25 -29 Overweight Nutritional Risks: None Diabetes: No  No results found for: HGBA1C   How often do you need to have someone help you when you read instructions, pamphlets, or other written materials from your doctor or pharmacy?: 1 - Never What is the last grade level you completed in school?: some college  Interpreter Needed?: No  Information entered by :: Lolita Libra, CMA   Activities of Daily Living     10/22/2023    9:10 AM  In your present state of health, do you have any difficulty performing the following activities:  Hearing? 1  Vision? 0  Difficulty concentrating or making decisions? 0  Walking or climbing stairs? 0  Dressing or bathing? 0  Doing errands, shopping? 0  Preparing Food and eating ? N  Using the Toilet? N  In the past six months, have you accidently leaked urine? N  Do you have problems with loss of bowel control? N  Managing your Medications? N  Managing your Finances? N  Housekeeping or managing your Housekeeping? N    Patient Care Team: Frann Mabel Mt, DO as PCP - General (Family Medicine) Patel, Donika K, DO as Consulting Physician (Neurology) Fulp, Donny LABOR., OD (Optometry)  I have updated your Care Teams any recent Medical Services you may have received from other providers in the past year.     Assessment:   This is a routine wellness examination for [place].  Hearing/Vision screen Hearing Screening - Comments:: Has bilateral hearing aids Vision Screening - Comments:: Wears RX glasses -- due for exam soon with Dr Alec.    Goals Addressed   None    Depression Screen     10/29/2023    3:13 PM  04/21/2023     7:59 AM 10/14/2022   10:31 AM 09/15/2022    7:53 AM 03/18/2022   11:19 AM 05/19/2021   11:09 AM 04/29/2021   12:47 PM  PHQ 2/9 Scores  PHQ - 2 Score 0 0 0 0 0 0 0  PHQ- 9 Score 0 0  0       Fall Risk     10/22/2023    9:10 AM 10/20/2023    7:58 AM 04/21/2023    7:58 AM 10/13/2022    6:55 PM 09/15/2022    7:52 AM  Fall Risk   Falls in the past year? 0 0 0 0 0  Number falls in past yr: 0 0 0 0 0  Injury with Fall? 0 0 0 0 0  Risk for fall due to : No Fall Risks   No Fall Risks No Fall Risks  Follow up Education provided Falls evaluation completed Falls evaluation completed Falls evaluation completed Falls evaluation completed    MEDICARE RISK AT HOME:  Medicare Risk at Home Any stairs in or around the home?: (Patient-Rptd) Yes If so, are there any without handrails?: (Patient-Rptd) No Home free of loose throw rugs in walkways, pet beds, electrical cords, etc?: (Patient-Rptd) Yes Adequate lighting in your home to reduce risk of falls?: (Patient-Rptd) Yes Life alert?: (Patient-Rptd) No Use of a cane, walker or w/c?: (Patient-Rptd) No Grab bars in the bathroom?: (Patient-Rptd) No Shower chair or bench in shower?: (Patient-Rptd) Yes Elevated toilet seat or a handicapped toilet?: (Patient-Rptd) Yes  TIMED UP AND GO:  Was the test performed?  No  Cognitive Function: 6CIT completed        10/29/2023    3:31 PM 10/14/2022   10:32 AM  6CIT Screen  What Year? 0 points 0 points  What month? 0 points 0 points  What time? 0 points 0 points  Count back from 20 0 points 0 points  Months in reverse 0 points 0 points  Repeat phrase 0 points 0 points  Total Score 0 points 0 points    Immunizations Immunization History  Administered Date(s) Administered    sv, Bivalent, Protein Subunit Rsvpref,pf [place]) 02/20/2022   Influenza Nasal 01/08/2023   Influenza, High Dose Seasonal PF 12/31/2019, 01/01/2021   Influenza,inj,Quad PF,6+ Mos 01/21/2014, 12/28/2016, 01/14/2018, 12/06/2018    Influenza-Unspecified 01/01/2010, 12/22/2015, 12/31/2019, 12/07/2021   PFIZER(Purple Top)SARS-COV-2 Vaccination 06/02/2019, 06/23/2019, 11/26/2019, 06/26/2020, 01/14/2021   PNEUMOCOCCAL CONJUGATE-20 02/24/2021   Pfizer Covid-19 Vaccine Bivalent Booster 48yrs & up 01/14/2021   Pfizer Fall 2024 Covid-19 Vaccine 68yrs thru 69yrs. 11/27/2022   Pneumococcal Polysaccharide-23 10/21/2016   Tdap 12/28/2016   Zoster Recombinant(Shingrix ) 01/14/2018, 08/19/2018   Zoster, Live 03/23/2012    Screening Tests Health Maintenance  Topic Date Due   Medicare Annual Wellness (AWV)  10/14/2023   INFLUENZA VACCINE  10/22/2023   COVID-19 Vaccine (6 - 2024-25 season) 04/21/2024 (Originally 01/22/2023)   MAMMOGRAM  12/23/2024   Colonoscopy  01/24/2025   DTaP/Tdap/Td (2 - Td or Tdap) 12/29/2026   Pneumococcal Vaccine: 50+ Years  Completed   DEXA SCAN  Completed   Hepatitis C Screening  Completed   Zoster Vaccines- Shingrix   Completed   Hepatitis B Vaccines  Aged Out   HPV VACCINES  Aged Out   Meningococcal B Vaccine  Aged Out    Health Maintenance  Health Maintenance Due  Topic Date Due   Medicare Annual Wellness (AWV)  10/14/2023   INFLUENZA VACCINE  10/22/2023   Health Maintenance Items Addressed:  Mammogram ordered, DEXA ordered, will get flu vaccine at pharmacy.  Additional Screening:  Vision Screening: Recommended annual ophthalmology exams for early detection of glaucoma and other disorders of the eye. Would you like a referral to an eye doctor? No    Dental Screening: Recommended annual dental exams for proper oral hygiene  Community Resource Referral / Chronic Care Management: CRR required this visit?  No   CCM required this visit?  No   Plan:    I have personally reviewed and noted the following in the patient's chart:   Medical and social history Use of alcohol, tobacco or illicit drugs  Current medications and supplements including opioid prescriptions. Patient is not  currently taking opioid prescriptions. Functional ability and status Nutritional status Physical activity Advanced directives List of other physicians Hospitalizations, surgeries, and ER visits in previous 12 months Vitals Screenings to include cognitive, depression, and falls Referrals and appointments  In addition, I have reviewed and discussed with patient certain preventive protocols, quality metrics, and best practice recommendations. A written personalized care plan for preventive services as well as general preventive health recommendations were provided to patient.   Lolita Libra, CMA   10/29/2023   After Visit Summary: (MyChart) Due to this being a telephonic visit, the after visit summary with patients personalized plan was offered to patient via MyChart   Notes: Nothing significant to report at this time.

## 2023-11-15 DIAGNOSIS — H2513 Age-related nuclear cataract, bilateral: Secondary | ICD-10-CM | POA: Diagnosis not present

## 2023-11-18 ENCOUNTER — Encounter: Payer: Self-pay | Admitting: Family Medicine

## 2023-11-23 ENCOUNTER — Encounter: Payer: Self-pay | Admitting: Sports Medicine

## 2023-11-29 ENCOUNTER — Ambulatory Visit: Payer: Self-pay

## 2023-11-29 NOTE — Telephone Encounter (Signed)
 Appt scheduled

## 2023-11-29 NOTE — Telephone Encounter (Signed)
 FYI Only or Action Required?: FYI only for provider.  Patient was last seen in primary care on 10/20/2023 by Frann Mabel Mt, DO.  Called Nurse Triage reporting Rash.  Symptoms began several days ago.  Interventions attempted: Other: Goldbond powder.  Symptoms are: gradually worsening.  Triage Disposition: See PCP Within 2 Weeks  Patient/caregiver understands and will follow disposition?: Yes  Message from [facility] sent at 11/29/2023 12:52 PM EDT  Pt has rash under right breast and its itching terribly   Reason for Disposition  Red, moist, irritated area between skin folds (or under larger breasts)  Answer Assessment - Initial Assessment Questions Additional info: 1) Received caller from [facility] for rash care advice, appointment has been scheduled.  2) Reports husband is being treated for jock itch.  3) She has not tried any otc medication at this time.    1. APPEARANCE of RASH: What does the rash look like? (e.g., blisters, dry flaky skin, red spots, redness, sores)     Red, moist 2. LOCATION: Where is the rash located?      Under right breast  3. NUMBER: How many spots are there?      one 4. SIZE: How big are the spots? (e.g., inches, cm; or compare to size of pinhead, tip of pen, eraser, pea)      Under breast 5. ONSET: When did the rash start?      Few days 6. ITCHING: Does the rash itch? If Yes, ask: How bad is the itch?  (Scale 0-10; or none, mild, moderate, severe)     intense 7. PAIN: Does the rash hurt? If Yes, ask: How bad is the pain?  (Scale 0-10; or none, mild, moderate, severe)     Burning intermittently 8. OTHER SYMPTOMS: Do you have any other symptoms? (e.g., fever)     denies  Protocols used: Rash or Redness - Localized-A-AH

## 2023-12-02 ENCOUNTER — Ambulatory Visit (INDEPENDENT_AMBULATORY_CARE_PROVIDER_SITE_OTHER): Admitting: Family

## 2023-12-02 VITALS — BP 118/62 | HR 90 | Temp 98.0°F | Resp 18 | Ht 66.0 in | Wt 170.0 lb

## 2023-12-02 DIAGNOSIS — L304 Erythema intertrigo: Secondary | ICD-10-CM | POA: Diagnosis not present

## 2023-12-02 MED ORDER — FLUCONAZOLE 150 MG PO TABS
150.0000 mg | ORAL_TABLET | Freq: Once | ORAL | 0 refills | Status: AC
Start: 2023-12-02 — End: 2023-12-02

## 2023-12-02 MED ORDER — NYSTATIN 100000 UNIT/GM EX OINT
1.0000 | TOPICAL_OINTMENT | Freq: Two times a day (BID) | CUTANEOUS | 0 refills | Status: AC
Start: 2023-12-02 — End: ?

## 2023-12-02 NOTE — Patient Instructions (Signed)
 VISIT SUMMARY:  You visited us  today due to a rash under your breast that has been persistent despite initial treatment.   YOUR PLAN:  CANDIDAL INTERTRIGO UNDER RIGHT BREAST: You have a recurring yeast infection under your breast, which is worsened by heat and sweating. -Apply nystatin  cream twice daily until the rash resolves, then use as needed. -Take a single dose of Diflucan  if the rash persists or worsens, especially during your travel. -Make sure to dry the area thoroughly after swimming and reapply the cream as needed. -Your prescriptions have been sent to [facility] pharmacy.

## 2023-12-02 NOTE — Progress Notes (Signed)
 Subjective:     Patient ID: Elaine Buck, female    DOB: July 14, 1955, 68 y.o.   MRN: [id number]  Chief Complaint  Patient presents with   skin check    Redness - r breast onset: 2 weeks    HPI  Discussed the use of AI scribe software for clinical note transcription with the patient, who gave verbal consent to proceed.  History of Present Illness  Elaine Buck is a 68 year old female who presents with a rash under her breasts.   The rash first appeared in July 2023 and was initially treated with Desitin and a yeast infection cream, which resolved it. Currently, she has been applying Desitin for two weeks beneath her right breast with some improvement, but the rash persists and is spreading slightly under her left breast. She experiences sweating under her breasts. She is concerned about managing the rash as she plans to travel to [place] next week.     Health Maintenance Due  Topic Date Due   Influenza Vaccine  10/22/2023   COVID-19 Vaccine (6 - 2025-26 season) 11/22/2023    Past Medical History:  Diagnosis Date   Anxiety    Follows w/ PCP, Dr. Mabel Pry.   Arthritis    left toes   Cancer (HCC)    breast (Left)   Complex regional pain syndrome of left lower extremity    type 1, follows with Dr. Lonni Humphreys @ [facility], LOV 07/17/21   Dry eye syndrome    History of colon polyps    History of lobular carcinoma in situ (LCIS) of  left breast    s/p lumpectomy in 2005, Tamoxifen/Evista therapy for 5 years   Hyperlipidemia    Follows w/ PCP, Dr. Mabel Pry.   Hypothyroidism    Follows w/ PCP, Dr. Mabel Pry.   Leg cramping 09/2021   taking muscle relaxants at night   Neuromuscular disorder (HCC)    small fiber neuropathy, L foot , follows with Dr. Lonni Charleston @ [facility].   Sacroiliac joint dysfunction of right side    Thyroid  disease    Wears glasses    Wears hearing aid    Wears partial  dentures    upper and lower    Past Surgical History:  Procedure Laterality Date   BREAST BIOPSY  2005   BREAST LUMPECTOMY Left 05/22/2003   LCIS Cells found in breast tissue   CARPAL TUNNEL RELEASE Bilateral    around 2003   COLONOSCOPY     04/15/2016, 11/24/2019, 01/25/2020, hx of multiple colon polyps   FOOT SURGERY Left 04/2019   joint inplantment in big toe    FOOT SURGERY Left 12/17/2021   left 1st MPJ fusion revision, 2nd hammer toe repair   GRAFT APPLICATION Left 03/19/2022   Procedure: GRAFT APPLICATION;  Surgeon: Gershon Donnice SAUNDERS, DPM;  Location: [facility];  Service: Podiatry;  Laterality: Left;   JOINT REPLACEMENT  February 2021   Left big toe   TUBAL LIGATION  1986   WOUND DEBRIDEMENT Left 03/19/2022   Procedure: DEBRIDEMENT WOUND/ULCER;  Surgeon: Gershon Donnice SAUNDERS, DPM;  Location: [facility];  Service: Podiatry;  Laterality: Left;    Family History  Problem Relation Age of Onset   Stroke Mother    Bipolar disorder Son    ADD / ADHD Son    Anxiety disorder Son    Depression Son    Colon cancer Neg Hx    Esophageal  cancer Neg Hx    Stomach cancer Neg Hx    Rectal cancer Neg Hx     Social History   Socioeconomic History   Marital status: Married    Spouse name: Not on file   Number of children: 2   Years of education: Not on file   Highest education level: Some college, no degree  Occupational History   Occupation: retired  Tobacco Use   Smoking status: Every Day    Current packs/day: 0.80    Average packs/day: 0.8 packs/day for 20.0 years (16.0 ttl pk-yrs)    Types: Cigarettes   Smokeless tobacco: Never   Tobacco comments:    Declines quitting for now  Vaping Use   Vaping status: Never Used  Substance and Sexual Activity   Alcohol use: Yes    Comment: 2-3 glasses every other month or less of wine. socially   Drug use: No   Sexual activity: Yes    Partners: Male    Birth control/protection: Post-menopausal   Other Topics Concern   Not on file  Social History Narrative   Left Handed   Lives in a one story home   Drinks one cup of coffee in the morning and Decaf in the evening    Social Drivers of Health   Financial Resource Strain: Low Risk  (10/29/2023)   Overall Financial Resource Strain (CARDIA)    Difficulty of Paying Living Expenses: Not hard at all  Food Insecurity: No Food Insecurity (10/29/2023)   Hunger Vital Sign    Worried About Running Out of Food in the Last Year: Never true    Ran Out of Food in the Last Year: Never true  Transportation Needs: No Transportation Needs (10/29/2023)   PRAPARE - Administrator, Civil Service (Medical): No    Lack of Transportation (Non-Medical): No  Physical Activity: Insufficiently Active (10/29/2023)   Exercise Vital Sign    Days of Exercise per Week: 2 days    Minutes of Exercise per Session: 20 min  Stress: No Stress Concern Present (10/29/2023)   [employer] of Occupational Health - Occupational Stress Questionnaire    Feeling of Stress: Not at all  Social Connections: Socially Isolated (10/29/2023)   Social Connection and Isolation Panel    Frequency of Communication with Friends and Family: Once a week    Frequency of Social Gatherings with Friends and Family: Once a week    Attends Religious Services: Never    Database administrator or Organizations: No    Attends Banker Meetings: Never    Marital Status: Married  Catering manager Violence: Not At Risk (10/29/2023)   Humiliation, Afraid, Rape, and Kick questionnaire    Fear of Current or Ex-Partner: No    Emotionally Abused: No    Physically Abused: No    Sexually Abused: No    Outpatient Medications Prior to Visit  Medication Sig Dispense Refill   amitriptyline  (ELAVIL ) 50 MG tablet TAKE 1/2 TABLET BY MOUTH EVERY NIGHT AT BEDTIME FOR ONE WEEK, THEN TAKE 1 TABLET BY MOUTH EVERY NIGHT AT BEDTIME 30 tablet 3   b complex vitamins capsule Take 1 capsule by  mouth daily.     Calcium  Carbonate-Vitamin D 600-400 MG-UNIT tablet Take by mouth.     Cholecalciferol (VITAMIN D3 PO) Take 1 tablet by mouth daily.     levothyroxine  (SYNTHROID ) 125 MCG tablet TAKE 1 TABLET BY MOUTH DAILY 100 tablet 2   lovastatin  (MEVACOR ) 10 MG tablet  TAKE 1 TABLET BY MOUTH AT  BEDTIME 100 tablet 2   Multiple Vitamin (MULTIVITAMIN) tablet Take 1 tablet by mouth daily.     Omega-3 Fatty Acids (FISH OIL PO) Take 1 tablet by mouth daily.     topiramate  (TOPAMAX ) 25 MG tablet TAKE 2 TABLETS BY MOUTH AT  BEDTIME 200 tablet 2   venlafaxine  XR (EFFEXOR -XR) 150 MG 24 hr capsule TAKE 1 CAPSULE BY MOUTH DAILY  WITH BREAKFAST 90 capsule 3   No facility-administered medications prior to visit.    Allergies  Allergen Reactions   Celebrex  [Celecoxib ] Swelling   Other Hives and Itching    Peaches    ROS     Objective:    Physical Exam Constitutional:      General: She is not in acute distress.    Appearance: Normal appearance. She is well-developed.  HENT:     Head: Normocephalic and atraumatic.     Right Ear: External ear normal.     Left Ear: External ear normal.  Eyes:     General: No scleral icterus. Neck:     Thyroid : No thyromegaly.  Cardiovascular:     Rate and Rhythm: Regular rhythm.  Pulmonary:     Effort: Pulmonary effort is normal.  Musculoskeletal:     Cervical back: Neck supple.  Skin:    General: Skin is warm and dry.     Comments: Erythematous rash noted beneath both breasts R>L.    Neurological:     Mental Status: She is alert and oriented to person, place, and time.  Psychiatric:        Mood and Affect: Mood normal.        Behavior: Behavior normal.        Thought Content: Thought content normal.        Judgment: Judgment normal.      BP 118/62   Pulse 90   Temp 98 F (36.7 C)   Resp 18   Ht 5' 6 (1.676 m)   Wt 170 lb (77.1 kg)   SpO2 97%   BMI 27.44 kg/m  Wt Readings from Last 3 Encounters:  12/02/23 170 lb (77.1 kg)   10/29/23 179 lb (81.2 kg)  10/20/23 179 lb (81.2 kg)       Assessment & Plan:   Problem List Items Addressed This Visit       Unprioritized   Intertrigo - Primary    Recurrent candidal intertrigo exacerbated by heat and sweating. Partial improvement with Desitin. Yeast infection confirmed. - Prescribed nystatin  cream twice daily until resolution, then PRN. - Prescribed single dose Diflucan  if rash persists or worsens, especially during travel. - Advised thorough drying after swimming and reapplication of cream as needed. - Sent prescriptions to [facility] pharmacy.      Relevant Medications   nystatin  ointment (MYCOSTATIN )   fluconazole  (DIFLUCAN ) 150 MG tablet    I am having Elaine Buck start on nystatin  ointment and fluconazole . I am also having her maintain her multivitamin, Omega-3 Fatty Acids (FISH OIL PO), Cholecalciferol (VITAMIN D3 PO), b complex vitamins, Calcium  Carbonate-Vitamin D, amitriptyline , topiramate , lovastatin , levothyroxine , and venlafaxine  XR.  Meds ordered this encounter  Medications   nystatin  ointment (MYCOSTATIN )    Sig: Apply 1 Application topically 2 (two) times daily.    Dispense:  30 g    Refill:  0    Supervising Provider:   DOMENICA BLACKBIRD A [[id number]]   fluconazole  (DIFLUCAN ) 150 MG tablet    Sig: Take 1  tablet (150 mg total) by mouth once for 1 dose.    Dispense:  1 tablet    Refill:  0    Supervising Provider:   DOMENICA BLACKBIRD A [[id number]]

## 2023-12-02 NOTE — Assessment & Plan Note (Signed)
  Recurrent candidal intertrigo exacerbated by heat and sweating. Partial improvement with Desitin. Yeast infection confirmed. - Prescribed nystatin  cream twice daily until resolution, then PRN. - Prescribed single dose Diflucan  if rash persists or worsens, especially during travel. - Advised thorough drying after swimming and reapplication of cream as needed. - Sent prescriptions to [facility] pharmacy.

## 2023-12-07 ENCOUNTER — Ambulatory Visit: Payer: Self-pay | Admitting: Family Medicine

## 2023-12-07 ENCOUNTER — Ambulatory Visit (HOSPITAL_BASED_OUTPATIENT_CLINIC_OR_DEPARTMENT_OTHER)
Admission: RE | Admit: 2023-12-07 | Discharge: 2023-12-07 | Disposition: A | Discharge status: Home / Self Care | Source: Ambulatory Visit | Attending: Family Medicine | Admitting: Family Medicine

## 2023-12-07 DIAGNOSIS — Z13820 Encounter for screening for osteoporosis: Secondary | ICD-10-CM | POA: Diagnosis not present

## 2023-12-07 DIAGNOSIS — Z780 Asymptomatic menopausal state: Secondary | ICD-10-CM | POA: Diagnosis not present

## 2023-12-07 DIAGNOSIS — M8589 Other specified disorders of bone density and structure, multiple sites: Secondary | ICD-10-CM | POA: Insufficient documentation

## 2023-12-07 DIAGNOSIS — M8580 Other specified disorders of bone density and structure, unspecified site: Secondary | ICD-10-CM | POA: Diagnosis present

## 2023-12-29 ENCOUNTER — Encounter: Payer: Self-pay | Admitting: Family Medicine

## 2023-12-29 ENCOUNTER — Ambulatory Visit (INDEPENDENT_AMBULATORY_CARE_PROVIDER_SITE_OTHER): Admitting: Family Medicine

## 2023-12-29 VITALS — BP 116/78 | HR 61 | Temp 98.7°F | Resp 16 | Ht 66.0 in | Wt 174.0 lb

## 2023-12-29 DIAGNOSIS — J0100 Acute maxillary sinusitis, unspecified: Secondary | ICD-10-CM

## 2023-12-29 DIAGNOSIS — H6592 Unspecified nonsuppurative otitis media, left ear: Secondary | ICD-10-CM

## 2023-12-29 LAB — POCT INFLUENZA A/B
Influenza A, POC: NEGATIVE
Influenza B, POC: NEGATIVE

## 2023-12-29 MED ORDER — DOXYCYCLINE HYCLATE 100 MG PO TABS
100.0000 mg | ORAL_TABLET | Freq: Two times a day (BID) | ORAL | 0 refills | Status: AC
Start: 2023-12-29 — End: 2024-01-05

## 2023-12-29 MED ORDER — PREDNISONE 20 MG PO TABS
40.0000 mg | ORAL_TABLET | Freq: Every day | ORAL | 0 refills | Status: AC
Start: 2023-12-29 — End: 2024-01-03

## 2023-12-29 MED ORDER — BENZONATATE 200 MG PO CAPS: 200.0000 mg | ORAL_CAPSULE | Freq: Two times a day (BID) | ORAL | 0 refills | Status: AC | PRN

## 2023-12-29 NOTE — Progress Notes (Signed)
 Chief Complaint  Patient presents with   Nasal Congestion    Congestion and Cough    Karna Lewis here for URI complaints.  Duration: 9 days  Associated symptoms: sinus headache, sinus congestion, itchy watery eyes, ear fullness, and coughing Denies: sinus pain, rhinorrhea, itchy watery eyes, ear pain, ear drainage, sore throat, wheezing, shortness of breath, myalgia, and fevers Treatment to date: Tylenol  Cold and Congestion Sick contacts: Yes Tested neg for COVID x 3.   Past Medical History:  Diagnosis Date   Anxiety    Follows w/ PCP, Dr. Mabel Pry.   Arthritis    left toes   Cancer (HCC)    breast (Left)   Complex regional pain syndrome of left lower extremity    type 1, follows with Dr. Lonni Humphreys @ [facility], LOV 07/17/21   Dry eye syndrome    History of colon polyps    History of lobular carcinoma in situ (LCIS) of  left breast    s/p lumpectomy in 2005, Tamoxifen/Evista therapy for 5 years   Hyperlipidemia    Follows w/ PCP, Dr. Mabel Pry.   Hypothyroidism    Follows w/ PCP, Dr. Mabel Pry.   Leg cramping 09/2021   taking muscle relaxants at night   Neuromuscular disorder (HCC)    small fiber neuropathy, L foot , follows with Dr. Lonni Charleston @ [facility].   Sacroiliac joint dysfunction of right side    Thyroid  disease    Wears glasses    Wears hearing aid    Wears partial dentures    upper and lower    Objective BP 116/78 (BP Location: Left Arm, Patient Position: Sitting)   Pulse 61   Temp 98.7 F (37.1 C) (Oral)   Resp 16   Ht 5' 6 (1.676 m)   Wt 174 lb (78.9 kg)   SpO2 97%   BMI 28.08 kg/m  General: Awake, alert, appears stated age HEENT: AT, [place], ears patent b/l and TM's neg, nares patent w/o discharge, pharynx pink and without exudates, MMM, + axillary sinus ttp Neck: No masses or asymmetry Heart: RRR Lungs: CTAB, no accessory muscle use Psych: Age appropriate judgment and  insight, normal mood and affect  Acute maxillary sinusitis, recurrence not specified - Plan: doxycycline (VIBRA-TABS) 100 MG tablet  Middle ear effusion, left - Plan: predniSONE  (DELTASONE ) 20 MG tablet  5-day prednisone  burst 40 mg daily.  If no improvement in next couple days, take 7-day course of doxycycline.  Tessalon Perles as needed for coughing.  Continue to push fluids, practice good hand hygiene, cover mouth when coughing. F/u prn. If starting to experience fevers, shaking, or shortness of breath, seek immediate care. Pt voiced understanding and agreement to the plan.  Mabel [place], DO 12/29/23 10:44 AM

## 2023-12-29 NOTE — Addendum Note (Signed)
 Addended by: Jaydenn Boccio M on: 12/29/2023 12:34 PM   Modules accepted: Orders

## 2023-12-29 NOTE — Patient Instructions (Signed)
 Continue to push fluids, practice good hand hygiene, and cover your mouth if you cough.  If you start having fevers, shaking or shortness of breath, seek immediate care.  Start with the prednisone . If no improvement in the next 2-3 days, take the doxycycline.   Let us  know if you need anything.

## 2023-12-30 ENCOUNTER — Ambulatory Visit (HOSPITAL_BASED_OUTPATIENT_CLINIC_OR_DEPARTMENT_OTHER)

## 2024-01-04 ENCOUNTER — Ambulatory Visit (HOSPITAL_BASED_OUTPATIENT_CLINIC_OR_DEPARTMENT_OTHER)
Admission: RE | Admit: 2024-01-04 | Discharge: 2024-01-04 | Disposition: A | Discharge status: Home / Self Care | Source: Ambulatory Visit | Attending: Family Medicine | Admitting: Family Medicine

## 2024-01-04 ENCOUNTER — Encounter (HOSPITAL_BASED_OUTPATIENT_CLINIC_OR_DEPARTMENT_OTHER): Payer: Self-pay

## 2024-01-04 DIAGNOSIS — Z1231 Encounter for screening mammogram for malignant neoplasm of breast: Secondary | ICD-10-CM | POA: Diagnosis not present

## 2024-03-11 ENCOUNTER — Other Ambulatory Visit: Payer: Self-pay | Admitting: Family Medicine

## 2024-03-11 DIAGNOSIS — E785 Hyperlipidemia, unspecified: Secondary | ICD-10-CM

## 2024-04-10 ENCOUNTER — Other Ambulatory Visit: Payer: Self-pay | Admitting: Family Medicine

## 2024-04-10 DIAGNOSIS — E039 Hypothyroidism, unspecified: Secondary | ICD-10-CM

## 2024-04-25 ENCOUNTER — Ambulatory Visit: Admitting: Family Medicine

## 2024-05-16 ENCOUNTER — Ambulatory Visit: Admitting: Family Medicine

## 2024-11-01 ENCOUNTER — Ambulatory Visit
